# Patient Record
Sex: Male | Born: 1950
Health system: Southern US, Community
[De-identification: ages and names within clinical notes are randomized; demographics above are authoritative.]

## PROBLEM LIST (undated history)

## (undated) DIAGNOSIS — B20 Human immunodeficiency virus [HIV] disease: Secondary | ICD-10-CM

## (undated) DIAGNOSIS — E785 Hyperlipidemia, unspecified: Secondary | ICD-10-CM

## (undated) DIAGNOSIS — Z21 Asymptomatic human immunodeficiency virus [HIV] infection status: Secondary | ICD-10-CM

## (undated) DIAGNOSIS — K759 Inflammatory liver disease, unspecified: Secondary | ICD-10-CM

## (undated) DIAGNOSIS — I10 Essential (primary) hypertension: Secondary | ICD-10-CM

## (undated) DIAGNOSIS — F419 Anxiety disorder, unspecified: Secondary | ICD-10-CM

## (undated) DIAGNOSIS — R011 Cardiac murmur, unspecified: Secondary | ICD-10-CM

## (undated) DIAGNOSIS — K7460 Unspecified cirrhosis of liver: Secondary | ICD-10-CM

## (undated) DIAGNOSIS — R002 Palpitations: Secondary | ICD-10-CM

## (undated) HISTORY — DX: Anxiety disorder, unspecified: F41.9

## (undated) HISTORY — PX: HERNIA REPAIR: SHX51

## (undated) HISTORY — PX: COLONOSCOPY: SHX174

## (undated) HISTORY — DX: Asymptomatic human immunodeficiency virus (hiv) infection status: Z21

## (undated) HISTORY — DX: Unspecified cirrhosis of liver: K74.60

## (undated) HISTORY — PX: EYE SURGERY: SHX253

## (undated) HISTORY — DX: Essential (primary) hypertension: I10

## (undated) HISTORY — DX: Human immunodeficiency virus (HIV) disease: B20

## (undated) HISTORY — DX: Hyperlipidemia, unspecified: E78.5

---

## 2003-03-22 ENCOUNTER — Encounter: Payer: Self-pay | Admitting: Internal Medicine

## 2003-09-27 ENCOUNTER — Encounter: Admission: RE | Admit: 2003-09-27 | Discharge: 2003-09-27 | Payer: Self-pay | Admitting: General Surgery

## 2003-10-01 ENCOUNTER — Ambulatory Visit (HOSPITAL_BASED_OUTPATIENT_CLINIC_OR_DEPARTMENT_OTHER): Admission: RE | Admit: 2003-10-01 | Discharge: 2003-10-01 | Payer: Self-pay | Admitting: General Surgery

## 2003-10-01 ENCOUNTER — Ambulatory Visit (HOSPITAL_COMMUNITY): Admission: RE | Admit: 2003-10-01 | Discharge: 2003-10-01 | Payer: Self-pay | Admitting: General Surgery

## 2004-07-07 ENCOUNTER — Ambulatory Visit: Payer: Self-pay | Admitting: Internal Medicine

## 2004-07-23 ENCOUNTER — Ambulatory Visit: Payer: Self-pay | Admitting: Internal Medicine

## 2005-05-06 ENCOUNTER — Ambulatory Visit: Payer: Self-pay | Admitting: Family Medicine

## 2005-07-13 ENCOUNTER — Ambulatory Visit: Payer: Self-pay | Admitting: Internal Medicine

## 2005-07-30 ENCOUNTER — Ambulatory Visit: Payer: Self-pay | Admitting: Internal Medicine

## 2005-08-20 ENCOUNTER — Ambulatory Visit: Payer: Self-pay | Admitting: Internal Medicine

## 2006-07-27 ENCOUNTER — Ambulatory Visit: Payer: Self-pay | Admitting: Internal Medicine

## 2006-07-27 LAB — CONVERTED CEMR LAB
CO2: 28 meq/L (ref 19–32)
Calcium: 9.3 mg/dL (ref 8.4–10.5)
Chloride: 101 meq/L (ref 96–112)
Creatinine, Ser: 0.9 mg/dL (ref 0.4–1.5)
Glucose, Bld: 99 mg/dL (ref 70–99)
HDL: 32.9 mg/dL — ABNORMAL LOW (ref >39.0)
Sodium: 136 meq/L (ref 135–145)
Triglycerides: 161 mg/dL — ABNORMAL HIGH (ref 0–149)
VLDL: 32 mg/dL (ref 0–40)

## 2006-08-12 ENCOUNTER — Ambulatory Visit: Payer: Self-pay | Admitting: Internal Medicine

## 2006-08-26 ENCOUNTER — Ambulatory Visit: Payer: Self-pay | Admitting: Internal Medicine

## 2006-08-26 ENCOUNTER — Encounter (INDEPENDENT_AMBULATORY_CARE_PROVIDER_SITE_OTHER): Payer: Self-pay | Admitting: *Deleted

## 2007-10-28 ENCOUNTER — Ambulatory Visit: Payer: Self-pay | Admitting: Internal Medicine

## 2007-10-28 DIAGNOSIS — J309 Allergic rhinitis, unspecified: Secondary | ICD-10-CM | POA: Insufficient documentation

## 2007-10-28 DIAGNOSIS — L570 Actinic keratosis: Secondary | ICD-10-CM | POA: Insufficient documentation

## 2007-10-28 DIAGNOSIS — I10 Essential (primary) hypertension: Secondary | ICD-10-CM | POA: Insufficient documentation

## 2007-10-28 DIAGNOSIS — E785 Hyperlipidemia, unspecified: Secondary | ICD-10-CM | POA: Insufficient documentation

## 2007-11-01 LAB — CONVERTED CEMR LAB
ALT: 126 units/L — ABNORMAL HIGH (ref 0–53)
AST: 99 units/L — ABNORMAL HIGH (ref 0–37)
CO2: 28 meq/L (ref 19–32)
Cholesterol: 125 mg/dL (ref 0–200)
Creatinine, Ser: 0.9 mg/dL (ref 0.4–1.5)
Eosinophils Absolute: 0.2 10*3/uL (ref 0.0–0.7)
GFR calc Af Amer: 112 mL/min
GFR calc non Af Amer: 93 mL/min
Glucose, Bld: 103 mg/dL — ABNORMAL HIGH (ref 70–99)
HCT: 46.2 % (ref 39.0–52.0)
Hemoglobin: 16.1 g/dL (ref 13.0–17.0)
LDL Cholesterol: 84 mg/dL (ref 0–99)
MCV: 94.7 fL (ref 78.0–100.0)
Monocytes Relative: 10.9 % (ref 3.0–12.0)
Neutro Abs: 4.1 10*3/uL (ref 1.4–7.7)
Phosphorus: 3.8 mg/dL (ref 2.3–4.6)
RBC: 4.88 M/uL (ref 4.22–5.81)
RDW: 12.3 % (ref 11.5–14.6)
Sodium: 135 meq/L (ref 135–145)
TSH: 1.64 microintl units/mL (ref 0.35–5.50)
Total Bilirubin: 0.6 mg/dL (ref 0.3–1.2)
Total CHOL/HDL Ratio: 5.6
Total Protein: 7.5 g/dL (ref 6.0–8.3)
VLDL: 19 mg/dL (ref 0–40)
WBC: 9.2 10*3/uL (ref 4.5–10.5)

## 2007-11-21 ENCOUNTER — Telehealth (INDEPENDENT_AMBULATORY_CARE_PROVIDER_SITE_OTHER): Payer: Self-pay | Admitting: *Deleted

## 2007-11-21 DIAGNOSIS — R945 Abnormal results of liver function studies: Secondary | ICD-10-CM | POA: Insufficient documentation

## 2007-11-22 ENCOUNTER — Ambulatory Visit: Payer: Self-pay | Admitting: Internal Medicine

## 2007-11-28 LAB — CONVERTED CEMR LAB
ALT: 108 units/L — ABNORMAL HIGH (ref 0–53)
Alkaline Phosphatase: 114 units/L (ref 39–117)
Hep A IgM: NEGATIVE
Hep B C IgM: POSITIVE — AB
Hepatitis B Surface Ag: POSITIVE — AB
Total Bilirubin: 0.8 mg/dL (ref 0.3–1.2)
Total Protein: 7.2 g/dL (ref 6.0–8.3)

## 2007-12-14 ENCOUNTER — Ambulatory Visit: Payer: Self-pay | Admitting: Internal Medicine

## 2007-12-16 LAB — CONVERTED CEMR LAB
ALT: 120 units/L — ABNORMAL HIGH (ref 0–53)
Albumin: 3.6 g/dL (ref 3.5–5.2)
Bilirubin, Direct: 0.1 mg/dL (ref 0.0–0.3)
Hep B C IgM: POSITIVE — AB
Hep B S Ab: NEGATIVE
Hepatitis B Surface Ag: POSITIVE — AB
Total Bilirubin: 0.9 mg/dL (ref 0.3–1.2)

## 2008-02-24 ENCOUNTER — Ambulatory Visit: Payer: Self-pay | Admitting: Internal Medicine

## 2008-02-29 ENCOUNTER — Telehealth: Payer: Self-pay | Admitting: Internal Medicine

## 2008-02-29 LAB — CONVERTED CEMR LAB: Hep B S Ab: NEGATIVE

## 2008-08-15 ENCOUNTER — Ambulatory Visit: Payer: Self-pay | Admitting: Family Medicine

## 2008-10-25 ENCOUNTER — Ambulatory Visit: Payer: Self-pay | Admitting: Infectious Diseases

## 2008-10-25 LAB — CONVERTED CEMR LAB
Basophils Absolute: 0 10*3/uL (ref 0.0–0.1)
Basophils Relative: 1 % (ref 0–1)
Cytomegalovirus Ab-IgG: POSITIVE — AB
Eosinophils Absolute: 0 10*3/uL (ref 0.0–0.7)
HCT: 39.8 % (ref 39.0–52.0)
Lymphs Abs: 2.4 10*3/uL (ref 0.7–4.0)
MCHC: 35.4 g/dL (ref 30.0–36.0)
MCV: 89.2 fL (ref 78.0–100.0)
Monocytes Absolute: 0.4 10*3/uL (ref 0.1–1.0)
Neutro Abs: 1.5 10*3/uL — ABNORMAL LOW (ref 1.7–7.7)
RBC: 4.46 M/uL (ref 4.22–5.81)
Streptococcus, Group A Screen (Direct): NEGATIVE
WBC: 4.4 10*3/uL (ref 4.0–10.5)

## 2008-10-29 ENCOUNTER — Telehealth: Payer: Self-pay | Admitting: Internal Medicine

## 2008-10-30 ENCOUNTER — Ambulatory Visit: Payer: Self-pay | Admitting: Internal Medicine

## 2008-10-31 ENCOUNTER — Telehealth: Payer: Self-pay | Admitting: Internal Medicine

## 2008-10-31 LAB — CONVERTED CEMR LAB
Albumin: 3.2 g/dL — ABNORMAL LOW (ref 3.5–5.2)
BUN: 8 mg/dL (ref 6–23)
Creatinine, Ser: 0.9 mg/dL (ref 0.4–1.5)
HDL: 20.6 mg/dL — ABNORMAL LOW (ref >39.00)
LDL Cholesterol: 77 mg/dL (ref 0–99)
Phosphorus: 2.6 mg/dL (ref 2.3–4.6)
Total Bilirubin: 0.8 mg/dL (ref 0.3–1.2)
Total CHOL/HDL Ratio: 6
Total Protein: 6.8 g/dL (ref 6.0–8.3)
VLDL: 34 mg/dL (ref 0.0–40.0)

## 2008-11-05 ENCOUNTER — Ambulatory Visit: Payer: Self-pay | Admitting: Internal Medicine

## 2008-11-06 ENCOUNTER — Encounter: Payer: Self-pay | Admitting: Internal Medicine

## 2008-11-08 ENCOUNTER — Telehealth: Payer: Self-pay | Admitting: Infectious Diseases

## 2008-11-13 ENCOUNTER — Telehealth: Payer: Self-pay | Admitting: Infectious Diseases

## 2008-11-13 ENCOUNTER — Ambulatory Visit: Payer: Self-pay | Admitting: Infectious Disease

## 2008-11-13 ENCOUNTER — Ambulatory Visit: Payer: Self-pay | Admitting: Cardiology

## 2008-11-13 ENCOUNTER — Ambulatory Visit: Payer: Self-pay | Admitting: Internal Medicine

## 2008-11-13 ENCOUNTER — Inpatient Hospital Stay (HOSPITAL_COMMUNITY): Admission: EM | Admit: 2008-11-13 | Discharge: 2008-11-15 | Payer: Self-pay | Admitting: Emergency Medicine

## 2008-11-14 ENCOUNTER — Encounter (INDEPENDENT_AMBULATORY_CARE_PROVIDER_SITE_OTHER): Payer: Self-pay | Admitting: *Deleted

## 2008-11-14 ENCOUNTER — Encounter: Payer: Self-pay | Admitting: Internal Medicine

## 2008-11-21 ENCOUNTER — Ambulatory Visit: Payer: Self-pay | Admitting: Infectious Disease

## 2008-11-21 DIAGNOSIS — B20 Human immunodeficiency virus [HIV] disease: Secondary | ICD-10-CM | POA: Insufficient documentation

## 2008-11-21 LAB — CONVERTED CEMR LAB
ALT: 35 units/L (ref 0–53)
AST: 58 units/L — ABNORMAL HIGH (ref 0–37)
Albumin: 3.6 g/dL (ref 3.5–5.2)
Basophils Absolute: 0.2 10*3/uL — ABNORMAL HIGH (ref 0.0–0.1)
Bilirubin, Direct: 0.1 mg/dL (ref 0.0–0.3)
CD4 T Cell Abs: 560
CO2: 22 meq/L (ref 19–32)
Creatinine, Ser: 0.94 mg/dL (ref 0.40–1.50)
MCHC: 34.3 g/dL (ref 30.0–36.0)
MCV: 91.4 fL (ref 78.0–100.0)
Monocytes Relative: 17 % — ABNORMAL HIGH (ref 3–12)
Neutrophils Relative %: 30 % — ABNORMAL LOW (ref 43–77)
RDW: 14.5 % (ref 11.5–15.5)
Sodium: 135 meq/L (ref 135–145)
Total Bilirubin: 0.5 mg/dL (ref 0.3–1.2)
Total Protein: 7.5 g/dL (ref 6.0–8.3)
WBC: 8 10*3/uL (ref 4.0–10.5)

## 2008-12-03 ENCOUNTER — Ambulatory Visit: Payer: Self-pay | Admitting: Infectious Disease

## 2008-12-03 DIAGNOSIS — D696 Thrombocytopenia, unspecified: Secondary | ICD-10-CM | POA: Insufficient documentation

## 2008-12-03 DIAGNOSIS — R599 Enlarged lymph nodes, unspecified: Secondary | ICD-10-CM | POA: Insufficient documentation

## 2008-12-03 DIAGNOSIS — B181 Chronic viral hepatitis B without delta-agent: Secondary | ICD-10-CM | POA: Insufficient documentation

## 2008-12-03 DIAGNOSIS — J3489 Other specified disorders of nose and nasal sinuses: Secondary | ICD-10-CM | POA: Insufficient documentation

## 2008-12-03 LAB — CONVERTED CEMR LAB
Chlamydia, Swab/Urine, PCR: NEGATIVE
Cholesterol, target level: 200 mg/dL
HDL goal, serum: 40 mg/dL

## 2008-12-04 ENCOUNTER — Encounter: Payer: Self-pay | Admitting: Infectious Disease

## 2008-12-07 ENCOUNTER — Ambulatory Visit: Payer: Self-pay | Admitting: Infectious Disease

## 2008-12-14 ENCOUNTER — Ambulatory Visit: Payer: Self-pay | Admitting: Infectious Disease

## 2008-12-20 ENCOUNTER — Telehealth: Payer: Self-pay | Admitting: Infectious Disease

## 2008-12-28 ENCOUNTER — Telehealth: Payer: Self-pay | Admitting: Infectious Disease

## 2009-01-11 ENCOUNTER — Ambulatory Visit: Payer: Self-pay | Admitting: Infectious Disease

## 2009-01-21 ENCOUNTER — Telehealth: Payer: Self-pay | Admitting: Internal Medicine

## 2009-02-04 ENCOUNTER — Ambulatory Visit: Payer: Self-pay | Admitting: Infectious Disease

## 2009-02-04 DIAGNOSIS — G4700 Insomnia, unspecified: Secondary | ICD-10-CM | POA: Insufficient documentation

## 2009-02-06 ENCOUNTER — Encounter: Payer: Self-pay | Admitting: Infectious Disease

## 2009-02-08 ENCOUNTER — Ambulatory Visit: Payer: Self-pay | Admitting: Infectious Disease

## 2009-02-08 LAB — CONVERTED CEMR LAB
ALT: 33 units/L (ref 0–53)
AST: 36 units/L (ref 0–37)
Albumin: 4.3 g/dL (ref 3.5–5.2)
Alkaline Phosphatase: 139 units/L — ABNORMAL HIGH (ref 39–117)
Basophils Relative: 1 % (ref 0–1)
CO2: 26 meq/L (ref 19–32)
Chloride: 100 meq/L (ref 96–112)
Glucose, Bld: 92 mg/dL (ref 70–99)
HCT: 46 % (ref 39.0–52.0)
HIV 1 RNA Quant: 230 copies/mL
Hemoglobin: 16 g/dL (ref 13.0–17.0)
Lymphocytes Relative: 40 % (ref 12–46)
MCHC: 34.8 g/dL (ref 30.0–36.0)
Monocytes Relative: 9 % (ref 3–12)
Neutro Abs: 5.3 10*3/uL (ref 1.7–7.7)
Phosphorus: 3.2 mg/dL (ref 2.3–4.6)
Platelets: 201 10*3/uL (ref 150–400)
RDW: 14.6 % (ref 11.5–15.5)
WBC: 10.9 10*3/uL — ABNORMAL HIGH (ref 4.0–10.5)

## 2009-03-13 ENCOUNTER — Telehealth: Payer: Self-pay | Admitting: Infectious Disease

## 2009-03-14 ENCOUNTER — Telehealth (INDEPENDENT_AMBULATORY_CARE_PROVIDER_SITE_OTHER): Payer: Self-pay | Admitting: *Deleted

## 2009-04-03 ENCOUNTER — Ambulatory Visit: Payer: Self-pay | Admitting: Infectious Disease

## 2009-04-03 LAB — CONVERTED CEMR LAB
CD4 Count: 805 microliters
HIV 1 RNA Quant: 39 copies/mL

## 2009-04-04 ENCOUNTER — Encounter: Payer: Self-pay | Admitting: Infectious Disease

## 2009-04-04 LAB — CONVERTED CEMR LAB
Calcium: 9.6 mg/dL (ref 8.4–10.5)
Cholesterol: 232 mg/dL — ABNORMAL HIGH (ref 0–200)
Creatinine, Urine: 104.3 mg/dL
LDL Cholesterol: 168 mg/dL — ABNORMAL HIGH (ref 0–99)
Potassium: 4.1 meq/L (ref 3.5–5.3)
Total Bilirubin: 2.6 mg/dL — ABNORMAL HIGH (ref 0.3–1.2)
Total CHOL/HDL Ratio: 7.7
Total Protein, Urine: 4
Triglycerides: 168 mg/dL — ABNORMAL HIGH (ref <150)
VLDL: 34 mg/dL (ref 0–40)

## 2009-05-16 ENCOUNTER — Ambulatory Visit: Payer: Self-pay | Admitting: Family Medicine

## 2009-05-28 ENCOUNTER — Ambulatory Visit: Payer: Self-pay | Admitting: Infectious Disease

## 2009-05-28 LAB — CONVERTED CEMR LAB
Albumin: 4.3 g/dL (ref 3.5–5.2)
Alkaline Phosphatase: 130 units/L — ABNORMAL HIGH (ref 39–117)
BUN: 9 mg/dL (ref 6–23)
Cholesterol: 232 mg/dL — ABNORMAL HIGH (ref 0–200)
HDL: 32 mg/dL — ABNORMAL LOW (ref >39)
Phosphorus: 3.1 mg/dL (ref 2.3–4.6)
Potassium: 4.2 meq/L (ref 3.5–5.3)
Sodium: 136 meq/L (ref 135–145)
Total CHOL/HDL Ratio: 7.3
Triglycerides: 156 mg/dL — ABNORMAL HIGH (ref <150)
VLDL: 31 mg/dL (ref 0–40)

## 2009-05-31 ENCOUNTER — Ambulatory Visit: Payer: Self-pay | Admitting: Infectious Disease

## 2009-06-10 ENCOUNTER — Ambulatory Visit: Payer: Self-pay | Admitting: Infectious Disease

## 2009-07-05 ENCOUNTER — Ambulatory Visit: Payer: Self-pay | Admitting: Internal Medicine

## 2009-08-27 ENCOUNTER — Ambulatory Visit: Payer: Self-pay | Admitting: Infectious Disease

## 2009-08-27 ENCOUNTER — Encounter: Payer: Self-pay | Admitting: Infectious Disease

## 2009-08-27 ENCOUNTER — Encounter: Payer: Self-pay | Admitting: Internal Medicine

## 2009-08-27 LAB — CONVERTED CEMR LAB
ALT: 57 units/L — ABNORMAL HIGH (ref 0–53)
AST: 39 units/L — ABNORMAL HIGH (ref 0–37)
Alkaline Phosphatase: 126 units/L — ABNORMAL HIGH (ref 39–117)
BUN: 10 mg/dL (ref 6–23)
Bilirubin, Direct: 0.4 mg/dL — ABNORMAL HIGH (ref 0.0–0.3)
Creatinine, Ser: 0.98 mg/dL (ref 0.40–1.50)
LDL Cholesterol: 70 mg/dL (ref 0–99)
Phosphorus: 2.7 mg/dL (ref 2.3–4.6)
Potassium: 3.8 meq/L (ref 3.5–5.3)
Sodium: 135 meq/L (ref 135–145)

## 2009-10-29 ENCOUNTER — Telehealth (INDEPENDENT_AMBULATORY_CARE_PROVIDER_SITE_OTHER): Payer: Self-pay | Admitting: *Deleted

## 2009-11-06 ENCOUNTER — Ambulatory Visit: Payer: Self-pay | Admitting: Infectious Disease

## 2009-11-06 ENCOUNTER — Encounter (INDEPENDENT_AMBULATORY_CARE_PROVIDER_SITE_OTHER): Payer: Self-pay | Admitting: *Deleted

## 2009-11-06 LAB — CONVERTED CEMR LAB
ALT: 136 units/L — ABNORMAL HIGH (ref 0–53)
Albumin: 4.6 g/dL (ref 3.5–5.2)
BUN: 11 mg/dL (ref 6–23)
CO2: 27 meq/L (ref 19–32)
Chloride: 102 meq/L (ref 96–112)
Creatinine, Ser: 0.98 mg/dL (ref 0.40–1.50)
Hemoglobin, Urine: NEGATIVE
Nitrite: NEGATIVE
Phosphorus: 3.6 mg/dL (ref 2.3–4.6)
Potassium: 4.7 meq/L (ref 3.5–5.3)
Sodium: 141 meq/L (ref 135–145)
Total Protein, Urine: 4
Total Protein: 8 g/dL (ref 6.0–8.3)
Urobilinogen, UA: 1 (ref 0.0–1.0)

## 2009-11-07 ENCOUNTER — Ambulatory Visit: Payer: Self-pay | Admitting: Infectious Disease

## 2009-11-07 LAB — CONVERTED CEMR LAB
Cholesterol, target level: 200 mg/dL
LDL Goal: 130 mg/dL

## 2010-01-25 ENCOUNTER — Telehealth: Payer: Self-pay | Admitting: Internal Medicine

## 2010-02-03 ENCOUNTER — Encounter: Payer: Self-pay | Admitting: Internal Medicine

## 2010-02-13 ENCOUNTER — Encounter (INDEPENDENT_AMBULATORY_CARE_PROVIDER_SITE_OTHER): Payer: Self-pay | Admitting: *Deleted

## 2010-03-05 ENCOUNTER — Ambulatory Visit: Payer: Self-pay | Admitting: Infectious Disease

## 2010-03-05 LAB — CONVERTED CEMR LAB
ALT: 23 units/L (ref 0–53)
Alkaline Phosphatase: 127 units/L — ABNORMAL HIGH (ref 39–117)
CO2: 25 meq/L (ref 19–32)
Calcium: 9 mg/dL (ref 8.4–10.5)
Cholesterol: 143 mg/dL (ref 0–200)
Creatinine, Ser: 0.86 mg/dL (ref 0.40–1.50)
Glucose, Bld: 105 mg/dL — ABNORMAL HIGH (ref 70–99)
Indirect Bilirubin: 2.9 mg/dL — ABNORMAL HIGH (ref 0.0–0.9)
Total Protein: 6.9 g/dL (ref 6.0–8.3)
VLDL: 41 mg/dL — ABNORMAL HIGH (ref 0–40)

## 2010-03-07 ENCOUNTER — Encounter: Payer: Self-pay | Admitting: *Deleted

## 2010-04-24 ENCOUNTER — Ambulatory Visit: Payer: Self-pay | Admitting: Internal Medicine

## 2010-04-28 LAB — CONVERTED CEMR LAB: PSA: 0.34 ng/mL (ref 0.10–4.00)

## 2010-06-04 ENCOUNTER — Encounter: Payer: Self-pay | Admitting: Infectious Disease

## 2010-06-04 ENCOUNTER — Ambulatory Visit
Admission: RE | Admit: 2010-06-04 | Discharge: 2010-06-04 | Payer: Self-pay | Source: Home / Self Care | Attending: Infectious Disease | Admitting: Infectious Disease

## 2010-06-04 DIAGNOSIS — F17200 Nicotine dependence, unspecified, uncomplicated: Secondary | ICD-10-CM | POA: Insufficient documentation

## 2010-06-22 LAB — CONVERTED CEMR LAB
ALT: 28 units/L (ref 0–53)
AST: 38 units/L — ABNORMAL HIGH (ref 0–37)
AST: 39 units/L — ABNORMAL HIGH (ref 0–37)
Albumin: 3.5 g/dL (ref 3.5–5.2)
Alkaline Phosphatase: 134 units/L — ABNORMAL HIGH (ref 39–117)
BUN: 14 mg/dL (ref 6–23)
BUN: 7 mg/dL (ref 6–23)
BUN: 8 mg/dL (ref 6–23)
Bilirubin Urine: NEGATIVE
Bilirubin, Direct: 0.2 mg/dL (ref 0.0–0.3)
Bilirubin, Direct: 0.2 mg/dL (ref 0.0–0.3)
CD4 Count: 456 microliters
CD4 Count: 783 microliters
CO2: 23 meq/L (ref 19–32)
CO2: 25 meq/L (ref 19–32)
Calcium: 8.8 mg/dL (ref 8.4–10.5)
Calcium: 9.1 mg/dL (ref 8.4–10.5)
Chloride: 102 meq/L (ref 96–112)
Chloride: 102 meq/L (ref 96–112)
Cholesterol: 176 mg/dL (ref 0–200)
Creatinine, Ser: 0.76 mg/dL (ref 0.40–1.50)
Creatinine, Ser: 0.92 mg/dL (ref 0.40–1.50)
Glucose, Bld: 69 mg/dL — ABNORMAL LOW (ref 70–99)
Glucose, Bld: 85 mg/dL (ref 70–99)
HDL: 23 mg/dL — ABNORMAL LOW (ref >39)
Hep B Core Total Ab: POSITIVE — AB
Indirect Bilirubin: 0.3 mg/dL (ref 0.0–0.9)
LDL Cholesterol: 117 mg/dL — ABNORMAL HIGH (ref 0–99)
Nitrite: NEGATIVE
Phosphorus: 2.4 mg/dL (ref 2.3–4.6)
Potassium: 4 meq/L (ref 3.5–5.3)
Potassium: 4.2 meq/L (ref 3.5–5.3)
Protein, U semiquant: NEGATIVE
Sodium: 134 meq/L — ABNORMAL LOW (ref 135–145)
Specific Gravity, Urine: 1.01
Total Bilirubin: 0.5 mg/dL (ref 0.3–1.2)
Total CHOL/HDL Ratio: 7.7
Total Protein: 7.4 g/dL (ref 6.0–8.3)
Triglycerides: 181 mg/dL — ABNORMAL HIGH (ref <150)
VLDL: 36 mg/dL (ref 0–40)
WBC Urine, dipstick: NEGATIVE
pH: 7

## 2010-06-24 NOTE — Assessment & Plan Note (Signed)
Summary: STUDY APPT/ LH    Current Allergies: ! * ZYBAN Social History: Tobacco Use:  yes  Vital Signs:  Patient profile:   60 year old male Weight:      197.4 pounds (89.73 kg) BMI:     27.25 Temp:     98.5 degrees F oral Pulse rate:   68 / minute Resp:     18 per minute BP sitting:   144 / 77  (left arm) Is Patient Diabetic? No Pain Assessment Patient in pain? no      Nutritional Status BMI of 25 - 29 = overweight  Does patient need assistance? Functional Status Self care Ambulation Normal   Patient here for week 36 study visit. He denies any current problems. Deirdre Evener RN  August 27, 2009 9:16 AM    Complete Medication List: 1)  Lisinopril-hydrochlorothiazide 20-25 Mg Tabs (Lisinopril-hydrochlorothiazide) .Marland Kitchen.. 1 tablet daily by mouth 2)  Ondansetron 4 Mg Tbdp (Ondansetron) .... Four times a day as needed for nausea 3)  Norvir 100 Mg Tabs (Ritonavir) .Marland Kitchen.. 1 by mouth daily with food 4)  Truvada 200-300 Mg Tabs (Emtricitabine-tenofovir) .Marland Kitchen.. 1 by mouth daily 5)  Reyataz 300 Mg Caps (Atazanavir sulfate) .Marland Kitchen.. 1 by mouth daily 6)  Citalopram Hydrobromide 20 Mg Tabs (Citalopram hydrobromide) .Marland Kitchen.. 1 tab daily 7)  Lunesta 1 Mg Tabs (Eszopiclone) .... Take 1 tab by mouth at bedtime as needed insominia 8)  Lipitor 20 Mg Tabs (Atorvastatin calcium) .... Take 1 tablet by mouth once a day 9)  Acyclovir 200 Mg Caps (Acyclovir) .... 4 tabs by mouth three times a day for 3-5 days as needed for cold sores  Other Orders: Est. Patient Research Study [id number]) T-Basic Metabolic Panel [phone]) T-Lipid Profile [id number]) T-Hepatic Function [id number]) T-Phosphorus [phone])  Process Orders Check Orders Results:     [facility]: ABN not required for this insurance Tests Sent for requisitioning (August 27, 2009 9:14 AM):     08/27/2009: [facility] -- T-Basic Metabolic Panel [phone] (signed)     08/27/2009: [facility] -- T-Lipid Profile [id number] (signed)     08/27/2009: [facility] -- T-Hepatic Function [phone] (signed)     08/27/2009: [facility] -- T-Phosphorus [[id number]] (signed)

## 2010-06-24 NOTE — Progress Notes (Signed)
Summary: refill request for citalopram  Phone Note Refill Request Message from:  [employer]  Refills Requested: Medication #1:  citalopram Electronic request from [facility], this is no longer on med list.  No last date filled given.  Initial call taken by: Lowella Petties CMA,  January 25, 2010 9:10 AM  Follow-up for Phone Call        Please check with him Taken off list at Inf disease visit okay to refill if he is still taking Follow-up by: Cindee Salt MD,  January 28, 2010 8:08 AM  Additional Follow-up for Phone Call Additional follow up Details #1::        spoke with wife she will call pt at work and call me back, it looks like pt never stopped this in June when Dr.Van Dam took him off. I will wait for wife's call. DeShannon Smith CMA [facility])  January 28, 2010 8:55 AM   patient called back and he is not taking this anymore and that he will ask Dr. Daiva Eves if he should. Additional Follow-up by: Mervin Hack CMA [facility]),  January 28, 2010 9:19 AM
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
## 2010-06-24 NOTE — Miscellaneous (Signed)
Summary: HIV-1 RNA (RESEARCH)  Clinical Lists Changes  Observations: Added new observation of HIV1RNA QA: 39 copies/mL (05/28/2009 16:29)

## 2010-06-24 NOTE — Consult Note (Signed)
Summary: [facility]  [facility]   Imported By: Lester Dustin [place] 02/10/2010 10:45:13  _____________________________________________________________________  External Attachment:    Type:   Image     Comment:   External Document  Appended Document: [facility] cautery done for recurrent epistaxis

## 2010-06-24 NOTE — Assessment & Plan Note (Signed)
Summary: STUDY APPT / LH    Current Allergies: ! * ZYBAN  Complete Medication List: 1)  Lisinopril-hydrochlorothiazide 20-25 Mg Tabs (Lisinopril-hydrochlorothiazide) .Marland Kitchen.. 1 tablet daily by mouth 2)  Acyclovir 800 Mg Tabs (Acyclovir) .Marland Kitchen.. 1 three times a day for up to 3-5 days as needed for cold sores 3)  Ondansetron 4 Mg Tbdp (Ondansetron) .... Four times a day as needed for nausea 4)  Norvir 100 Mg Tabs (Ritonavir) .Marland Kitchen.. 1 by mouth daily with food 5)  Truvada 200-300 Mg Tabs (Emtricitabine-tenofovir) .Marland Kitchen.. 1 by mouth daily 6)  Reyataz 300 Mg Caps (Atazanavir sulfate) .Marland Kitchen.. 1 by mouth daily 7)  Citalopram Hydrobromide 20 Mg Tabs (Citalopram hydrobromide) .Marland Kitchen.. 1 tab daily 8)  Lunesta 1 Mg Tabs (Eszopiclone) .... Take 1 tab by mouth at bedtime as needed insominia

## 2010-06-24 NOTE — Assessment & Plan Note (Signed)
Summary: STUDY APPT/ LH    Current Allergies: ! * ZYBAN Social History: Tobacco Use:  yes  Vital Signs:  Patient profile:   60 year old male Weight:      193 pounds (87.73 kg) BMI:     26.64 Temp:     98.7 degrees F oral Pulse rate:   64 / minute Resp:     16 per minute BP sitting:   144 / 88  (left arm) Is Patient Diabetic? No Pain Assessment Patient in pain? no      Nutritional Status BMI of 19 -24 = normal  Does patient need assistance? Functional Status Self care Ambulation Normal   Patient here for week 24 study visit. York Spaniel he recently got over a sinus infection and was treated with augmentin. Denies any other current problems except dry mouth. Has been very adherent with medications. Appt. to see Dr. Daiva Eves in a few weeks.Deirdre Evener RN  May 28, 2009 9:17 AM    Complete Medication List: 1)  Lisinopril-hydrochlorothiazide 20-25 Mg Tabs (Lisinopril-hydrochlorothiazide) .Marland Kitchen.. 1 tablet daily by mouth 2)  Acyclovir 800 Mg Tabs (Acyclovir) .Marland Kitchen.. 1 three times a day for up to 3-5 days as needed for cold sores 3)  Ondansetron 4 Mg Tbdp (Ondansetron) .... Four times a day as needed for nausea 4)  Norvir 100 Mg Tabs (Ritonavir) .Marland Kitchen.. 1 by mouth daily with food 5)  Truvada 200-300 Mg Tabs (Emtricitabine-tenofovir) .Marland Kitchen.. 1 by mouth daily 6)  Reyataz 300 Mg Caps (Atazanavir sulfate) .Marland Kitchen.. 1 by mouth daily 7)  Citalopram Hydrobromide 20 Mg Tabs (Citalopram hydrobromide) .Marland Kitchen.. 1 tab daily 8)  Lunesta 1 Mg Tabs (Eszopiclone) .... Take 1 tab by mouth at bedtime as needed insominia  Other Orders: Est. Patient Research Study [id number]) T-Basic Metabolic Panel [id number]) T-Hepatic Function [id number]) T-Lipid Profile [id number]) T-Phosphorus [phone]) Process Orders Check Orders Results:     [facility]: ABN not required for this insurance Tests Sent for requisitioning (May 28, 2009 11:00 PM):     05/28/2009: [facility] -- T-Basic  Metabolic Panel [phone] (signed)     05/28/2009: [facility] -- T-Hepatic Function [phone] (signed)     05/28/2009: [facility] -- T-Lipid Profile [phone] (signed)     05/28/2009: [facility] -- T-Phosphorus [[id number]] (signed)

## 2010-06-24 NOTE — Miscellaneous (Signed)
Summary: HIV-1 RNA, CD4 (RESEARCH)  Clinical Lists Changes  Observations: Added new observation of CD4 COUNT: 724 microliters (03/05/2010 14:47) Added new observation of HIV1RNA QA: 39 copies/mL (03/05/2010 14:47)

## 2010-06-24 NOTE — Miscellaneous (Signed)
Summary: CD4 (RESEARCH)  Clinical Lists Changes  Observations: Added new observation of CD4 COUNT: 1136 microliters (05/31/2009 16:31)

## 2010-06-24 NOTE — Progress Notes (Signed)
Summary: refill/mld  Phone Note Refill Request Message from:  Fax from Pharmacy on October 29, 2009 9:04 AM  Refills Requested: Medication #1:  NORVIR 100 MG TABS 1 by mouth daily with food   Last Refilled: 09/28/2009  Method Requested: Fax to Local Pharmacy Next Appointment Scheduled: November 07, 2009 Initial call taken by: Paulo Fruit  BS,CPht II,MPH,  October 29, 2009 9:05 AM    Prescriptions: NORVIR 100 MG TABS (RITONAVIR) 1 by mouth daily with food  #31 x 11   Entered by:   Paulo Fruit  BS,CPht II,MPH   Authorized by:   Acey Lav MD   Signed by:   Paulo Fruit  BS,CPht II,MPH on 10/29/2009   Method used:   Electronically to        [id number] (retail)       [address]       [place], [place]  [address]       Ph: [phone] or [phone]       Fax: [phone]   RxID:   [id number]  Paulo Fruit  BS,CPht II,MPH  October 29, 2009 9:05 AM

## 2010-06-24 NOTE — Miscellaneous (Signed)
Summary: HIV-1 RNA, CD4 (RESEARCH)  Clinical Lists Changes  Observations: Added new observation of CD4 COUNT: 741 microliters (08/27/2009 16:35) Added new observation of HIV1RNA QA: 41 copies/mL (08/27/2009 16:35)

## 2010-06-24 NOTE — Miscellaneous (Signed)
Summary: HIV-1 RNA, CD4 (RESEARCH)  Clinical Lists Changes  Observations: Added new observation of CD4 COUNT: 941 microliters (11/06/2009 9:48) Added new observation of HIV1RNA QA: 39 copies/mL (11/06/2009 9:48)
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
## 2010-06-24 NOTE — Assessment & Plan Note (Signed)
Summary: STUDY APPT/ LH    Current Allergies: ! * ZYBAN Vital Signs:  Patient profile:   60 year old male Weight:      198.4 pounds (90.18 kg) BMI:     27.38 Temp:     98.4 degrees F oral Pulse rate:   66 / minute Resp:     16 per minute BP sitting:   125 / 85  (left arm) Is Patient Diabetic? No Pain Assessment Patient in pain? no      Nutritional Status BMI of 25 - 29 = overweight  Does patient need assistance? Functional Status Self care Ambulation Normal   Patient here for week 48 study visit. He denies any new problems, but does c/o dry throat which has been fairly persistent. He stopped his celexa on his own last month, because he felt like he didn't need it anymore. He has been very adherent.Deirdre Evener RN  November 06, 2009 11:43 AM   Other Orders: Est. Patient Research Study [id number]) T-Basic Metabolic Panel [id number]) T-Hepatic Function [id number]) T-Urine Protein [id number]) T-Phosphorus [id number]) T-Lipid Profile [id number]) T-Urinalysis [id number]) T-Urine Creatinine ([id number])  Process Orders Check Orders Results:     [facility]: ABN not required for this insurance Tests Sent for requisitioning (November 06, 2009 11:38 AM):     11/06/2009: [facility] -- T-Basic Metabolic Panel [id number] (signed)     11/06/2009: [facility] -- T-Hepatic Function [phone] (signed)     11/06/2009: [facility] -- T-Urine Protein [id number] (signed)     11/06/2009: [facility] -- T-Phosphorus [[id number]] (signed)     11/06/2009: [facility] -- T-Lipid Profile [id number] (signed)     11/06/2009: [facility] -- T-Urinalysis [[id number]] (signed)     11/06/2009: [facility] -- T-Urine Creatinine [[id number]] (signed)
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
## 2010-06-24 NOTE — Miscellaneous (Signed)
Summary: RW update  Clinical Lists Changes  Observations: Added new observation of RWPARTICIP: Yes (02/13/2010 9:08)

## 2010-06-24 NOTE — Miscellaneous (Signed)
Summary: norvir good thru 10/30/10   Clinical Lists Changes found old refill request for norvir. dated 10/29/09. will not need refills until 6/12.Golden Circle RN  March 07, 2010 8:15 AM

## 2010-06-24 NOTE — Assessment & Plan Note (Signed)
   Process Orders Check Orders Results:     [facility]: ABN not required for this insurance Order queued for requisitioning for [facility]: August 27, 2009 9:05 AM  Tests Sent for requisitioning (August 27, 2009 9:05 AM):     08/27/2009: [facility] -- T-RPR (Syphilis) [phone] (signed)

## 2010-06-25 ENCOUNTER — Ambulatory Visit: Admit: 2010-06-25 | Payer: Self-pay | Admitting: Infectious Disease

## 2010-06-25 ENCOUNTER — Ambulatory Visit (INDEPENDENT_AMBULATORY_CARE_PROVIDER_SITE_OTHER): Payer: Self-pay

## 2010-06-25 ENCOUNTER — Encounter: Payer: Self-pay | Admitting: Infectious Disease

## 2010-06-25 DIAGNOSIS — B20 Human immunodeficiency virus [HIV] disease: Secondary | ICD-10-CM

## 2010-06-25 LAB — CONVERTED CEMR LAB
BUN: 12 mg/dL (ref 6–23)
Bilirubin, Direct: 0.5 mg/dL — ABNORMAL HIGH (ref 0.0–0.3)
CD4 Count: 893 microliters
Chloride: 96 meq/L (ref 96–112)
Cholesterol: 134 mg/dL (ref 0–200)
Glucose, Bld: 155 mg/dL — ABNORMAL HIGH (ref 70–99)
HIV 1 RNA Quant: 39 copies/mL
Indirect Bilirubin: 2.4 mg/dL — ABNORMAL HIGH (ref 0.0–0.9)
LDL Cholesterol: 70 mg/dL (ref 0–99)
Potassium: 4.2 meq/L (ref 3.5–5.3)
VLDL: 31 mg/dL (ref 0–40)
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
## 2010-07-02 ENCOUNTER — Encounter (INDEPENDENT_AMBULATORY_CARE_PROVIDER_SITE_OTHER): Payer: Self-pay | Admitting: *Deleted

## 2010-07-02 NOTE — Miscellaneous (Signed)
Summary: Orders Update  Clinical Lists Changes  Orders: Added new Service order of Est. Patient Research Study [phone]) - Signed Added new Test order of T-Basic Metabolic Panel [id number]) - Signed Added new Test order of T-Hepatic Function [phone]) - Signed Added new Test order of T-Lipid Profile ([id number]) - Signed Added new Test order of T-Phosphorus ([id number]) - Signed

## 2010-07-02 NOTE — Assessment & Plan Note (Signed)
Summary: STUDY APPT/ LH    Current Allergies: ! * ZYBAN  Other Orders: Est. Patient Research Study [phone]) T-Basic Metabolic Panel [id number]) T-Hepatic Function [id number]) T-Lipid Profile [id number]) T-Phosphorus [id number])

## 2010-07-03 ENCOUNTER — Encounter (INDEPENDENT_AMBULATORY_CARE_PROVIDER_SITE_OTHER): Payer: Self-pay | Admitting: *Deleted

## 2010-07-10 NOTE — Miscellaneous (Signed)
  Clinical Lists Changes 

## 2010-07-10 NOTE — Miscellaneous (Signed)
  Clinical Lists Changes  Observations: Added new observation of PAYOR: Private (07/03/2010 13:52) Added new observation of RACE: White (07/03/2010 13:52) Added new observation of LATINO/HISP: No (07/03/2010 13:52)

## 2010-07-16 NOTE — Miscellaneous (Signed)
Summary: HIV-1 RNA, CD4 (RESEARCH)  Clinical Lists Changes  Observations: Added new observation of CD4 COUNT: 893 microliters (06/25/2010 10:49) Added new observation of HIV1RNA QA: 39 copies/mL (06/25/2010 10:49)

## 2010-09-01 ENCOUNTER — Encounter: Payer: Self-pay | Admitting: Infectious Disease

## 2010-09-01 LAB — C-REACTIVE PROTEIN: CRP: 0.7 mg/dL — ABNORMAL HIGH (ref <0.6)

## 2010-09-01 LAB — CBC
HCT: 41.6 % (ref 39.0–52.0)
Platelets: 104 10*3/uL — ABNORMAL LOW (ref 150–400)
RDW: 14 % (ref 11.5–15.5)
WBC: 6.6 10*3/uL (ref 4.0–10.5)

## 2010-09-01 LAB — COMPREHENSIVE METABOLIC PANEL
AST: 69 U/L — ABNORMAL HIGH (ref 0–37)
Albumin: 3.1 g/dL — ABNORMAL LOW (ref 3.5–5.2)
Alkaline Phosphatase: 143 U/L — ABNORMAL HIGH (ref 39–117)
BUN: 6 mg/dL (ref 6–23)
Creatinine, Ser: 0.79 mg/dL (ref 0.4–1.5)
GFR calc Af Amer: >60 mL/min (ref >60)
Potassium: 4.1 mEq/L (ref 3.5–5.1)
Total Protein: 7.4 g/dL (ref 6.0–8.3)

## 2010-09-01 LAB — HEPATITIS B DNA, ULTRAQUANTITATIVE, PCR: Hepatitis B DNA (Calc): >64020000 copies/mL — ABNORMAL HIGH (ref <169)

## 2010-09-01 LAB — PROTEIN ELECTROPH W RFLX QUANT IMMUNOGLOBULINS
Alpha-1-Globulin: 5.1 % — ABNORMAL HIGH (ref 2.9–4.9)
Beta 2: 3.7 % (ref 3.2–6.5)
Gamma Globulin: 31.4 % — ABNORMAL HIGH (ref 11.1–18.8)
M-Spike, %: NOT DETECTED g/dL

## 2010-09-01 LAB — DIFFERENTIAL
Eosinophils Relative: 0 % (ref 0–5)
Lymphocytes Relative: 66 % — ABNORMAL HIGH (ref 12–46)
Monocytes Absolute: 0.7 10*3/uL (ref 0.1–1.0)
Monocytes Relative: 11 % (ref 3–12)
Neutro Abs: 1.4 10*3/uL — ABNORMAL LOW (ref 1.7–7.7)

## 2010-09-01 LAB — BARTONELLA ANTIBODY PANEL
B henselae IgG: <1:64 {titer}
B henselae IgM: <1:16 {titer}

## 2010-09-01 LAB — HEPATITIS B SURFACE ANTIGEN: Hepatitis B Surface Ag: POSITIVE — AB

## 2010-09-01 LAB — ANTI-NUCLEAR AB-TITER (ANA TITER)

## 2010-09-01 LAB — HIV-1 RNA QUANT-NO REFLEX-BLD
HIV 1 RNA Quant: 1890000 copies/mL — ABNORMAL HIGH (ref <48)
HIV-1 RNA Quant, Log: 6.28 {Log} — ABNORMAL HIGH (ref <1.68)

## 2010-09-01 LAB — HIV 1/2 CONFIRMATION
HIV-1 antibody: POSITIVE
HIV-2 Ab: UNDETERMINED

## 2010-09-01 LAB — CK: Total CK: 77 U/L (ref 7–232)

## 2010-09-01 LAB — HIV-1 GENOTYPR PLUS

## 2010-09-01 LAB — ANA: Anti Nuclear Antibody(ANA): POSITIVE — AB

## 2010-09-01 LAB — CRYPTOCOCCAL ANTIGEN: Crypto Ag: NEGATIVE

## 2010-09-01 LAB — T-HELPER CELLS (CD4) COUNT (NOT AT ARMC): CD4 T Cell Abs: 560 uL (ref 400–2700)

## 2010-09-01 LAB — HEPATITIS C ANTIBODY: HCV Ab: NEGATIVE

## 2010-09-01 LAB — LACTATE DEHYDROGENASE: LDH: 345 U/L — ABNORMAL HIGH (ref 94–250)

## 2010-09-29 ENCOUNTER — Other Ambulatory Visit: Payer: Self-pay | Admitting: *Deleted

## 2010-09-29 MED ORDER — LISINOPRIL-HYDROCHLOROTHIAZIDE 20-25 MG PO TABS: 1.0000 | ORAL_TABLET | Freq: Every day | ORAL | Status: DC

## 2010-10-07 NOTE — Consult Note (Signed)
NAME:  Dale Mcdonald, Dale Mcdonald NO.:  [id number]   MEDICAL RECORD NO.:  [id number]          PATIENT TYPE:  INP   LOCATION:  5528                         FACILITY:  [facility]   PHYSICIAN:  Acey Lav, MD  DATE OF BIRTH:  [birth year]   DATE OF CONSULTATION:  11/13/2008  DATE OF DISCHARGE:                                 CONSULTATION   DUPLICATE      Acey Lav, MD  Electronically Signed     CV/MEDQ  D:  11/13/2008  T:  11/13/2008  Job:  [id number]

## 2010-10-07 NOTE — Consult Note (Signed)
NAME:  Dale Mcdonald, Dale Mcdonald NO.:  [id number]   MEDICAL RECORD NO.:  [id number]          PATIENT TYPE:  INP   LOCATION:  5528                         FACILITY:  [facility]   PHYSICIAN:  Acey Lav, MD  DATE OF BIRTH:  Oct 06, 1950   DATE OF CONSULTATION:  11/13/2008  DATE OF DISCHARGE:                                 CONSULTATION   REASON FOR CONSULTATION:  Reason for Infectious Disease consultation is  fever of unknown origin, rash, and thrombocytopenia.   HISTORY OF PRESENT ILLNESS:  Dale Mcdonald is a 60 year old Caucasian male  with a past medical history significant for hypertension and smoking,  who developed acute onset of a febrile illness with a temperature up to  103, malaise, body aches, and an erythematous maculopapular rash on his  upper torso and arms.  There was concern for [facility] Spotted  Fever and the patient contacted Dr. Maple Hudson, who works with his wife, and  also Dr. Maurice March, my partner in Infectious Disease.  He was started on  doxycycline.  In the interim, his fevers resolved, but he still had  malaise profound fatigue.  He was evaluated by my partner, Dr. Maurice March, on  June 3rd.  Dr. Maurice March reviewed his past labs, which had included a  negative HIV antibody in June of 2009.  He reviewed the patient's recent  labs, which have included a white blood cell count that was up to 19,000  and then it had come down to 13,000, and also platelets that were  70,000.  Dr. Maurice March felt that the patient's symptoms were more consistent  with a febrile viral illness rather than [facility] Spotted Fever,  per se.  He did do a rapid Strep test, which was negative.  He also  checked cytomegalovirus antibodies and IgG was positive, IgM was  negative.  After seeing Dr. Maurice March, the patient then developed left lower  quadrant pain and had an ultrasound, which apparently showed some  gallstones.  I do not have that report in front of me, though.  The left  lower quadrant pain  resolved.  He continued to have persistent  temperatures of 99.5 or so up to 100 at night in particular,  occasionally with sweats, though not terribly drenching in nature.  More  than anything, he continued to have profound fatigue and difficulty  completing his work without having to take several naps in the day.  He  also developed some painful, swollen lymph nodes in his neck, which then  went down again and then reemerged.  Additionally, he continued to have  a dry throat at times and also a sensation of something in the back of  my throat.  He did not have dysphagia with solids or foods or any  regurgitation.  The patient has been followed also closely by his  primary care physician, Dr. Alphonsus Sias.  He had recently checked a prostate-  specific antigen, which was normal, a renal function panel, which was  unremarkable, a TSH, which was normal, hepatic function panel, which  showed elevated alkaline phosphatase of 147, AST and ALT  of 252 and 162.  The patient continued to feel more and more fatigued and again had some  swelling of his lymph nodes over the last couple days.  Due to his  worsening fatigue and desire to have a thorough workup into the cause of  his symptoms, the patient was admitted to the [facility]  under the care of Dr. Felicity Coyer.  We were consulted in Infectious Disease  to help work up this patient's low grade temperatures, thrombocytopenia,  and lymphadenopathy.   PAST MEDICAL HISTORY:  1. Hypertension.  2. Smoking history.Marland Kitchen   PAST SURGICAL HISTORY:  Patient had a history of hernia repair on the  right.   FAMILY HISTORY:  Patient's father died of lung cancer and was a smoker.  His mother died of HPV associated cancer.   SOCIAL HISTORY:  The patient is a smoker, smokes 1.5 packs per day, no  alcohol, no recreational drug use.  He has traveled throughout the  [place] including the [employer].  He has also traveled to  [place], [place], and  [place].  This travel history, however, is more  remote.  He lives in [place] and is married.   REVIEW OF SYSTEMS:  As described in the History Present Illness.  The  patient also apparently gained 7 pounds and then lost 5 pounds with a  net change of 12.  He has not had any vomiting or diarrhea, although he  has been diagnosed previously with irritable bowel syndrome..  Otherwise, 10-point review of systems is as in the History of Present  Illness; otherwise, negative.   PHYSICAL EXAMINATION:  VITAL SIGNS:  Temperature current is 98, blood  pressure 186/93, pulse 62, respirations 24, pulse-ox 99% room air.  He  weighs 85 kg.  GENERAL:  A pleasant gentleman in no acute distress.  HEENT:  Normocephalic, atraumatic.  Pupils equal, round, and react to  light.  Sclerae anicteric.  Oropharynx without erythema or exudate.  NECK:  A few tender, freely movable, cervical lymph nodes especially in  the anterior chain.  CARDIOVASCULAR:  Regular rate and rhythm.  No murmurs, gallops, or rubs.  LUNGS:  A few end-expiratory crackles at the bases; otherwise, clear.  ABDOMEN:  Soft and nondistended.  Liver palpated a few centimeters below  the right costal margin.  The spleen did not appear enlarged.  EXTREMITIES:  Without edema.  SKIN:  Remnants of a fading macular rash on his chest.  LYMPH NODES:  Cervical lymph nodes as described.  He had no  supraclavicular lymphadenopathy, no axillary lymphadenopathy, and no  inguinal lymphadenopathy.  NEUROLOGICAL:  Exam nonfocal.   LABORATORY DATA:  Labs are as described above.  Additionally, a chest x-  ray in 2005 showed no active lung disease.  His sedimentation rate on  arrival here is 23.  His comprehensive metabolic panel shows sodium 140,  potassium 4.1, chloride 105, bicarb 29, BUN and creatinine 6 and .79,  glucose 99.  Bilirubin 0.6, alk-phos 143, AST and ALT 99 and 49, albumin  3.1.  CBC with differential:  White count of 6.6, hemoglobin 14.3,   platelets 104,000.  ANC is 1.4, and his absolute lymphocyte count is  4.4.   IMPRESSION AND RECOMMENDATIONS:  This is a 60 year old Caucasian  gentleman with the onset approximately a month ago of acute febrile  illness with a macular rash with laboratory findings at that time  pertinent for leukocytosis and thrombocytopenia status post doxycycline  with initial improvement in his symptoms  of fevers but with persistence  in symptoms of fatigue, who has had evanescent cervical lymphadenopathy,  persistent fatigue, and thrombocytopenia with some mild elevation in his  liver function tests.  1. Fever of unknown origin.  Note the patient does not technically      meet the definition of fever of unknown origin as his temperatures      are not sufficiently high to meet any of the of the classifications      of fever of unknown origin in the original papers by or in any of      the reclassification schemes.  However, he does have a low-grade      pyrexia with temperatures in the 99 range.  He has certainly a      thrombocytopenia, which is not of any clear cause, and he does have      some lymphadenopathy without a clear cause either.  I am going to      work him up as a fever of unknown origin.  I think certainly his      past infection with hepatitis B last year is certainly a      possibility.  It has never been proven whether or not he has      cleared his acute hepatitis B infection.  So we will recheck his      hepatitis panel along with hepatitis B e antigen and hepatitis B      DNA.  We will check hepatitis C virus as well as cryoglobulins and      hepatitis A.  We will recheck him for HIV with an antibody test and      RNA.  I will check him for Epstein-Barr virus serologies, although      Epstein-Barr virus infection in a patient of his age is      exceptionally unusual.  I will consider getting a cytomegalovirus      PCR, although again I would find this highly unlikely in this       patient.  I do think it is worthwhile initiating imaging to look      for malignancy and getting a CT of the chest, abdomen, and pelvis      with IV and oral contrast.  Also, we will look for occult      abscesses.  I will check an ANA, I will check a sed rate, ANCA,      rheumatoid factor.  I will check an SPEP, and I will check some cat      scratch serologies for Bartonella henselae, although he really does      not have much in the way of a story due to suggest this and his      lymphadenopathy is fairly symmetrical and isolated to his cervical      lymph node chain on exam.  I will initiate further lab and workup      as needed.  A bone marrow biopsy might be helpful in him given his      thrombocytopenia without clear cause.  We will also check a      peripheral smear.  The rash has resolved.  2. Hepatitis as is read above.  We will recheck a hepatitis panel and      check in particular hepatitis B e antigen and a hepatitis B DNA.  3. Pain medications.  I would discourage him from using any Tylenol or      ibuprofen as this  may mask fevers in the hospital.   Thank you for this consultation.      Acey Lav, MD  Electronically Signed     CV/MEDQ  D:  11/13/2008  T:  11/13/2008  Job:  [id number]

## 2010-10-10 NOTE — Op Note (Signed)
NAME:  Dale Mcdonald, Dale Mcdonald                          ACCOUNT NO.:  [id number]   MEDICAL RECORD NO.:  [id number]                   PATIENT TYPE:  AMB   LOCATION:  DSC                                  FACILITY:  [facility]   PHYSICIAN:  Ollen Gross. Vernell Morgans, M.D.              DATE OF BIRTH:  14-Feb-1951   DATE OF PROCEDURE:  10/01/2003  DATE OF DISCHARGE:                                 OPERATIVE REPORT   PREOPERATIVE DIAGNOSIS:  Right inguinal hernia.   POSTOPERATIVE DIAGNOSIS:  Right direct inguinal hernia.   PROCEDURE:  Right inguinal hernia repair with mesh.   SURGEON:  Ollen Gross. Carolynne Edouard, M.D.   ANESTHESIA:  General via LMA.   PROCEDURE:  After informed consent was obtained, the patient was brought to  the operating room and placed in the supine position on the operating table.  After adequate induction of general anesthesia, the patient's right groin  and abdomen were prepped with Betadine and draped in the usual sterile  manner.  An incision was made from the edge of the pubic tubercle on the  right toward the anterior superior iliac spine for a distance of about 5 cm.  This incision was carried down through the skin and subcutaneous tissue  sharply with the Bovie electrocautery until the fascia of the external  oblique was encountered.  The external oblique fascia was opened along its  fibers toward the apex of the external ring sharply with the Metzenbaum  scissors.  Care was taken to avoid the ilioinguinal nerve beneath.  Once  this was accomplished a Weitlaner retractor was placed to retract fascia  superiorly and inferiorly.  Blunt dissection was then carried out along the  cord structures at the edge of the pubic tubercle until the cord structures  could be surrounded between two fingers.  A half-inch Penrose drain was then  placed around the cord structures for retraction purposes.  The cord was  skeletonized with gentle blunt dissection with a hemostat, and there was no  indirect  component of hernia sac identified.  There was an obvious bulge  medial to the cord structures at the floor of the inguinal canal that was  broad-based and small.  This was able to be reduced and the floor of the  inguinal canal was repaired with interrupted 2-0 Vicryl stitches.  Next a  piece of 3 x 6 Prolene mesh was used and cut to fit.  Tails were cut in the  mesh laterally.  The mesh was then sewn inferiorly to the shelving edge of  the inguinal ligament using a running 2-0 Prolene stitch.  The mesh was sewn  superiorly to the musculoaponeurotic fascial layer of the transversalis with  interrupted 2-0 Prolene vertical mattress sutures.  The tails of the mesh  were wrapped around the cord structures and anchored laterally to the  shelving edge of the inguinal ligament with an interrupted 2-0 Prolene  stitch.  Once this was accomplished, the repair was under no tension and  appeared to be in good position.  The wound was irrigated with copious  amounts of saline and appeared to be hemostatic.  The external oblique  fascia was then reapproximated with a running 2-0 Vicryl stitch.  The wound  was then infiltrated with 0.25% Marcaine.  The subcutaneous fascia was  closed with a running 3-0 Vicryl stitch and the skin was closed with a  running 4-0 Monocryl subcuticular stitch.  Benzoin and Steri-Strips and  sterile dressings were applied.  The patient tolerated the procedure well.  At the end of the case all needle, sponge, and instrument counts were  correct.  The patient was then awakened and taken to the recovery room in  stable condition.                                               Ollen Gross. Vernell Morgans, M.D.   PST/MEDQ  D:  10/01/2003  T:  10/01/2003  Job:  [id number]

## 2010-10-10 NOTE — Discharge Summary (Signed)
NAME:  Dale Mcdonald, Dale Mcdonald NO.:  [id number]   MEDICAL RECORD NO.:  [id number]          PATIENT TYPE:  INP   LOCATION:  5528                         FACILITY:  [facility]   PHYSICIAN:  Rosalyn Gess. Norins, MD  DATE OF BIRTH:  [birth year]   DATE OF ADMISSION:  11/13/2008  DATE OF DISCHARGE:  11/15/2008                               DISCHARGE SUMMARY   ADMITTING DIAGNOSIS:  Fevers of unknown origin.   DISCHARGE DIAGNOSIS:  Fever of unknown origin.   HISTORY OF PRESENT ILLNESS:  The patient presents with a 4-week history  of extreme fatigue, severe sore throat, muscle aches, low-grade fever,  and rash of the anterior chest with nausea, and diffuse lymphadenopathy.  The patient had been seen over the past 2 weeks by Dr. Darlina Sicilian of the  ID service and had a negative strep test.  He was also followed by Dr.  Alphonsus Sias as an outpatient.  At the time of admission, the patient noted  left-sided abdominal pain that was severe, but this was improved at the  time of admission.  Please see the admission note for past medical  history, family history, and social history.   HOSPITAL COURSE:  The patient was seen in consultation by the ID service  by Dr. Daiva Eves.  The patient felt well when I first saw him on January 14, 2009, and had no fever at that time.  Laboratory was negative  including CT chest was negative, CT abdomen negative, CT pelvis  negative.  Cryptococcal antigen was negative.  CRP was 0.7.  ESR was 23.  Rheumatoid factor was less than 20.  Of note, the patient did have a  positive HIV ELISA study.  Confirmatory Western blot was pending at the  time of discharge.  The patient adamantly denied any risk factors.  It  was felt that the patient since additional studies including anti-  hepatitis A IgM was negative, hepatitis B service antigen was negative,  anti-HCV was negative, and he had a normal exam that there was no need  for him to be kept in hospital with being afebrile and  medically stable.  The plan was for Infectious Disease to follow up with the patient in  regards to his Western blot where he would then be seen in the ID  clinic.   PHYSICAL EXAMINATION:  The patient's examination at the last evaluation  by this dictator:  VITAL SIGNS:  Temperature was 98, blood pressure 136/79, heart rate 65,  respirations 20, O2 sat 94% on room air.  GENERAL APPEARANCE:  Well-nourished gentleman in no acute distress.  CHEST:  Clear with no rales, wheezes, or rhonchi.  CARDIOVASCULAR:  Unremarkable.  ABDOMEN:  Soft.  No guarding or rebound.  No organosplenomegaly was  appreciated.  EXTREMITIES:  Without edema.   LABORATORY DATA:  As noted above were all negative.   DISPOSITION:  The patient is discharged home.  He is to follow up with  the Infectious Disease Clinic.  To resume all of his home medications  without change, which is basically just lisinopril for blood pressure.  He will follow up with Dr. Alphonsus Sias in addition to the ID clinic.      Rosalyn Gess Norins, MD  Electronically Signed     MEN/MEDQ  D:  01/20/2009  T:  01/21/2009  Job:  [id number]

## 2010-10-15 ENCOUNTER — Encounter: Payer: Self-pay | Admitting: *Deleted

## 2010-10-15 ENCOUNTER — Ambulatory Visit (INDEPENDENT_AMBULATORY_CARE_PROVIDER_SITE_OTHER): Payer: Self-pay | Admitting: *Deleted

## 2010-10-15 VITALS — BP 141/91 | HR 64 | Temp 98.2°F | Resp 15 | Ht 71.5 in | Wt 200.0 lb

## 2010-10-15 DIAGNOSIS — B20 Human immunodeficiency virus [HIV] disease: Secondary | ICD-10-CM

## 2010-10-15 LAB — LIPID PANEL
Cholesterol: 151 mg/dL (ref 0–200)
LDL Cholesterol: 85 mg/dL (ref 0–99)
VLDL: 26 mg/dL (ref 0–40)

## 2010-10-15 LAB — HEPATIC FUNCTION PANEL
ALT: 53 U/L (ref 0–53)
Bilirubin, Direct: 0.4 mg/dL — ABNORMAL HIGH (ref 0.0–0.3)
Indirect Bilirubin: 2.1 mg/dL — ABNORMAL HIGH (ref 0.0–0.9)

## 2010-10-15 LAB — BASIC METABOLIC PANEL
BUN: 10 mg/dL (ref 6–23)
Glucose, Bld: 103 mg/dL — ABNORMAL HIGH (ref 70–99)
Potassium: 4 mEq/L (ref 3.5–5.3)

## 2010-10-15 NOTE — Progress Notes (Signed)
10/15/10 @ 07:30: Pt here for Study A5257/A5001, week 96. Pt stated he is feeling well and [place]. No new findings. Assessment unchanged since last study visit. Pt received $20 gift card for visit and $140 for co-pay reimbursement. Pt received study medications. Next study visit scheduled for 02/06/2011 @ 0730. -- Tacey Heap RN

## 2010-10-16 LAB — URINALYSIS
Hgb urine dipstick: NEGATIVE
Leukocytes, UA: NEGATIVE
Nitrite: NEGATIVE
Protein, ur: NEGATIVE mg/dL

## 2010-10-16 LAB — HEPATITIS B SURFACE ANTIBODY,QUALITATIVE: Hep B S Ab: NEGATIVE

## 2010-10-16 LAB — HEPATITIS B SURFACE ANTIGEN: Hepatitis B Surface Ag: POSITIVE — AB

## 2010-10-17 LAB — HEPATITIS A ANTIBODY, TOTAL: Hep A Total Ab: POSITIVE — AB

## 2010-10-27 ENCOUNTER — Other Ambulatory Visit: Payer: Self-pay | Admitting: *Deleted

## 2010-10-27 DIAGNOSIS — E7800 Pure hypercholesterolemia, unspecified: Secondary | ICD-10-CM

## 2010-10-27 MED ORDER — ATORVASTATIN CALCIUM 10 MG PO TABS: 10.0000 mg | ORAL_TABLET | Freq: Every day | ORAL | Status: DC

## 2010-11-05 ENCOUNTER — Encounter: Payer: Self-pay | Admitting: Infectious Disease

## 2010-11-05 LAB — HIV-1 RNA QUANT-NO REFLEX-BLD: HIV-1 RNA Viral Load: <40

## 2010-12-08 ENCOUNTER — Encounter: Payer: Self-pay | Admitting: Infectious Disease

## 2010-12-08 ENCOUNTER — Ambulatory Visit (INDEPENDENT_AMBULATORY_CARE_PROVIDER_SITE_OTHER): Payer: 59 | Admitting: Infectious Disease

## 2010-12-08 DIAGNOSIS — B20 Human immunodeficiency virus [HIV] disease: Secondary | ICD-10-CM

## 2010-12-08 DIAGNOSIS — B1910 Unspecified viral hepatitis B without hepatic coma: Secondary | ICD-10-CM

## 2010-12-08 DIAGNOSIS — G4700 Insomnia, unspecified: Secondary | ICD-10-CM

## 2010-12-08 MED ORDER — ZOLPIDEM TARTRATE 10 MG PO TABS: 10.0000 mg | ORAL_TABLET | Freq: Every evening | ORAL | Status: DC | PRN

## 2010-12-09 NOTE — Assessment & Plan Note (Signed)
Renewed his [place]

## 2010-12-09 NOTE — Assessment & Plan Note (Signed)
Perfectly suppressed.

## 2010-12-09 NOTE — Assessment & Plan Note (Signed)
On truvada 

## 2010-12-09 NOTE — Progress Notes (Signed)
  Subjective:    Patient ID: Dale Mcdonald, male    DOB: [birth year], 60 y.o.   MRN: [id number]  HPI  60 year old male with HIV and Hep B coinfection currently superbly controlled and with high cd4 of 999. He is doing relatively well. Unfortunately his wife suffered a traumatic fall with apparent "brain damage." This is causing him a great deal of distress. He also inquired about zostavax vaccine. Otherwise he is doing well.  Review of Systems  Constitutional: Negative for fever, chills, diaphoresis, activity change, appetite change, fatigue and unexpected weight change.  HENT: Negative for congestion, sore throat, rhinorrhea, sneezing, trouble swallowing and sinus pressure.   Eyes: Negative for photophobia and visual disturbance.  Respiratory: Negative for cough, chest tightness, shortness of breath, wheezing and stridor.   Cardiovascular: Negative for chest pain, palpitations and leg swelling.  Gastrointestinal: Negative for nausea, vomiting, abdominal pain, diarrhea, constipation, blood in stool, abdominal distention and anal bleeding.  Genitourinary: Negative for dysuria, hematuria, flank pain and difficulty urinating.  Musculoskeletal: Negative for myalgias, back pain, joint swelling, arthralgias and gait problem.  Skin: Negative for color change, pallor, rash and wound.  Neurological: Negative for dizziness, tremors, weakness and light-headedness.  Hematological: Negative for adenopathy. Does not bruise/bleed easily.  Psychiatric/Behavioral: Negative for behavioral problems, confusion, sleep disturbance, dysphoric mood, decreased concentration and agitation.       Objective:   Physical Exam  Constitutional: He is oriented to person, place, and time. He appears well-developed and well-nourished. No distress.  HENT:  Head: Normocephalic and atraumatic.  Mouth/Throat: Oropharynx is clear and moist. No oropharyngeal exudate.  Eyes: Conjunctivae and EOM are normal. Pupils are equal,  round, and reactive to light. No scleral icterus.  Neck: Normal range of motion. Neck supple. No JVD present.  Cardiovascular: Normal rate, regular rhythm and normal heart sounds.  Exam reveals no gallop and no friction rub.   No murmur heard. Pulmonary/Chest: Effort normal and breath sounds normal. No respiratory distress. He has no wheezes. He has no rales. He exhibits no tenderness.  Abdominal: He exhibits no distension and no mass. There is no tenderness. There is no rebound and no guarding.  Musculoskeletal: He exhibits no edema and no tenderness.  Lymphadenopathy:    He has no cervical adenopathy.  Neurological: He is alert and oriented to person, place, and time. He has normal reflexes. He exhibits normal muscle tone. Coordination normal.  Skin: Skin is warm and dry. He is not diaphoretic. No erythema. No pallor.  Psychiatric: He has a normal mood and affect. His behavior is normal. Judgment and thought content normal.          Assessment & Plan:

## 2010-12-10 ENCOUNTER — Ambulatory Visit: Payer: Self-pay | Admitting: Infectious Disease

## 2011-01-12 ENCOUNTER — Other Ambulatory Visit: Payer: Self-pay | Admitting: *Deleted

## 2011-01-12 MED ORDER — LISINOPRIL-HYDROCHLOROTHIAZIDE 20-25 MG PO TABS: 1.0000 | ORAL_TABLET | Freq: Every day | ORAL | Status: DC

## 2011-02-06 ENCOUNTER — Ambulatory Visit (INDEPENDENT_AMBULATORY_CARE_PROVIDER_SITE_OTHER): Payer: Self-pay | Admitting: *Deleted

## 2011-02-06 VITALS — BP 140/84 | HR 60 | Temp 98.2°F | Resp 16 | Wt 195.8 lb

## 2011-02-06 DIAGNOSIS — B20 Human immunodeficiency virus [HIV] disease: Secondary | ICD-10-CM

## 2011-02-06 LAB — PHOSPHORUS: Phosphorus: 2.3 mg/dL (ref 2.3–4.6)

## 2011-02-06 LAB — COMPREHENSIVE METABOLIC PANEL
Albumin: 4.5 g/dL (ref 3.5–5.2)
Alkaline Phosphatase: 107 U/L (ref 39–117)
BUN: 11 mg/dL (ref 6–23)
Glucose, Bld: 126 mg/dL — ABNORMAL HIGH (ref 70–99)
Potassium: 4.3 mEq/L (ref 3.5–5.3)
Total Bilirubin: 2.5 mg/dL — ABNORMAL HIGH (ref 0.3–1.2)

## 2011-02-06 LAB — LIPID PANEL
Cholesterol: 146 mg/dL (ref 0–200)
HDL: 30 mg/dL — ABNORMAL LOW (ref >39)
Total CHOL/HDL Ratio: 4.9 Ratio

## 2011-02-06 NOTE — Progress Notes (Signed)
Patient here for his week 112 study visit. He is doing very well, no major complaints. Has noticed some increased abd. Girth he is concerned about and is considering treatment for it. He also wants to get a zostavax at his next appt with Dr. Daiva Eves. Will return in January for his next appt. He will be coming off study in the late Spring and will prescriptions for all his meds at that time.

## 2011-02-11 ENCOUNTER — Telehealth: Payer: Self-pay | Admitting: *Deleted

## 2011-02-11 NOTE — Telephone Encounter (Signed)
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 States he has a hx of this. His wife is a Engineer, civil (consulting) & states this is what it is. Has Dr. Cloria Spring as ENT. Has seen him before but it is hard to get an appt when the herpes break out. He is taking acyclovir 800 mg tid ansd an OTC steroidal creme in the ear. Wants to klnow if this is ok. I asked him to call Dr. Lazarus Salines to ask same question & to see if he can be seen. He states he would but wants this md opinion

## 2011-02-19 NOTE — Telephone Encounter (Signed)
Acyclovir 800mg  FIVE TIMES DAILY WOULD BE THE CORRECT DOSE FOR THIS VS VALTREX 1GRAM THREE TIMES DAILY. I LEFT MESSAGE FOR PT ON HIS CELL PHONE

## 2011-02-26 ENCOUNTER — Encounter: Payer: Self-pay | Admitting: Infectious Disease

## 2011-02-26 LAB — CD4/CD8 (T-HELPER/T-SUPPRESSOR CELL)
CD4%: 44.2
CD4: 928

## 2011-02-26 LAB — HIV-1 RNA QUANT-NO REFLEX-BLD: HIV-1 RNA Viral Load: <40

## 2011-03-03 ENCOUNTER — Ambulatory Visit (INDEPENDENT_AMBULATORY_CARE_PROVIDER_SITE_OTHER): Payer: BC Managed Care – PPO | Admitting: Infectious Disease

## 2011-03-03 ENCOUNTER — Ambulatory Visit: Payer: 59 | Admitting: Infectious Disease

## 2011-03-03 ENCOUNTER — Encounter: Payer: Self-pay | Admitting: Infectious Disease

## 2011-03-03 ENCOUNTER — Telehealth: Payer: Self-pay | Admitting: *Deleted

## 2011-03-03 VITALS — BP 163/82 | HR 68 | Temp 98.4°F | Wt 193.0 lb

## 2011-03-03 DIAGNOSIS — B20 Human immunodeficiency virus [HIV] disease: Secondary | ICD-10-CM

## 2011-03-03 DIAGNOSIS — E785 Hyperlipidemia, unspecified: Secondary | ICD-10-CM

## 2011-03-03 DIAGNOSIS — I10 Essential (primary) hypertension: Secondary | ICD-10-CM

## 2011-03-03 DIAGNOSIS — B0221 Postherpetic geniculate ganglionitis: Secondary | ICD-10-CM

## 2011-03-03 DIAGNOSIS — B181 Chronic viral hepatitis B without delta-agent: Secondary | ICD-10-CM

## 2011-03-03 DIAGNOSIS — B028 Zoster with other complications: Secondary | ICD-10-CM

## 2011-03-03 MED ORDER — AMLODIPINE BESYLATE 5 MG PO TABS: 5.0000 mg | ORAL_TABLET | Freq: Every day | ORAL | Status: DC

## 2011-03-03 MED ORDER — VALACYCLOVIR HCL 1 G PO TABS: 1000.0000 mg | ORAL_TABLET | Freq: Three times a day (TID) | ORAL | Status: DC

## 2011-03-03 MED ORDER — RITONAVIR 100 MG PO CAPS: 100.0000 mg | ORAL_CAPSULE | Freq: Every day | ORAL | Status: DC

## 2011-03-03 MED ORDER — VALACYCLOVIR HCL 500 MG PO TABS: 500.0000 mg | ORAL_TABLET | Freq: Every day | ORAL | Status: DC

## 2011-03-03 NOTE — Assessment & Plan Note (Signed)
Doing fantastic on reyataz, norvir and truvada. When he comes off study will likely consider at single tablet regimen such as atripla, stribild or complera

## 2011-03-03 NOTE — Assessment & Plan Note (Signed)
Add amlodipine. 

## 2011-03-03 NOTE — Assessment & Plan Note (Signed)
At goal.  

## 2011-03-03 NOTE — Telephone Encounter (Signed)
rec'd a fax from [facility] stating that this med needed to be prior auth from his prescription company. I called [phone] id # is [id number]. He does not need a prior [place]

## 2011-03-03 NOTE — Progress Notes (Signed)
  Subjective:    Patient ID: Dale Mcdonald, male    DOB: Mar 11, 1951, 60 y.o.   MRN: [id number]  HPI  60 year old with HIV superbly well controlled on boosted reyataz, norvir and truvada via ACTG 5257. He returns for routine followup. He appears to have had yet another episode of zoster oticus and took high dose valtrex for this with resolution in his lesions. He is still smoking vigorously but is under a great deal of stress related to medical issues with his wife who has had pathological fractures and also suffered TBI.  Review of Systems  Constitutional: Negative for fever, chills, diaphoresis, activity change, appetite change, fatigue and unexpected weight change.  HENT: Negative for congestion, sore throat, rhinorrhea, sneezing, trouble swallowing and sinus pressure.   Eyes: Negative for photophobia and visual disturbance.  Respiratory: Negative for cough, chest tightness, shortness of breath, wheezing and stridor.   Cardiovascular: Negative for chest pain, palpitations and leg swelling.  Gastrointestinal: Negative for nausea, vomiting, abdominal pain, diarrhea, constipation, blood in stool, abdominal distention and anal bleeding.  Genitourinary: Negative for dysuria, hematuria, flank pain and difficulty urinating.  Musculoskeletal: Negative for myalgias, back pain, joint swelling, arthralgias and gait problem.  Skin: Positive for rash. Negative for color change, pallor and wound.  Neurological: Negative for dizziness, tremors, weakness and light-headedness.  Hematological: Negative for adenopathy. Does not bruise/bleed easily.  Psychiatric/Behavioral: Negative for behavioral problems, confusion, sleep disturbance, dysphoric mood, decreased concentration and agitation.       Objective:   Physical Exam  Constitutional: He is oriented to person, place, and time. He appears well-developed and well-nourished. No distress.  HENT:  Head: Normocephalic and atraumatic.  Mouth/Throat:  Oropharynx is clear and moist. No oropharyngeal exudate.  Eyes: Conjunctivae and EOM are normal. Pupils are equal, round, and reactive to light. No scleral icterus.  Neck: Normal range of motion. Neck supple. No JVD present.  Cardiovascular: Normal rate, regular rhythm and normal heart sounds.  Exam reveals no gallop and no friction rub.   No murmur heard. Pulmonary/Chest: Effort normal and breath sounds normal. No respiratory distress. He has no wheezes. He has no rales. He exhibits no tenderness.  Abdominal: He exhibits no distension and no mass. There is no tenderness. There is no rebound and no guarding.  Musculoskeletal: He exhibits no edema and no tenderness.  Lymphadenopathy:    He has no cervical adenopathy.  Neurological: He is alert and oriented to person, place, and time. He has normal reflexes. He exhibits normal muscle tone. Coordination normal.  Skin: Skin is warm and dry. He is not diaphoretic. No erythema. No pallor.  Psychiatric: He has a normal mood and affect. His behavior is normal. Judgment and thought content normal.          Assessment & Plan:  HIV INFECTION Doing fantastic on reyataz, norvir and truvada. When he comes off study will likely consider at single tablet regimen such as atripla, stribild or complera  HEPATITIS B, CHRONIC On truvada  Herpes zoster oticus Will put him on prophylactic valtrex. Will try to get him the zostavax vaccine  HYPERLIPIDEMIA At goal  HYPERTENSION Add amlodipine

## 2011-03-03 NOTE — Assessment & Plan Note (Signed)
Will put him on prophylactic valtrex. Will try to get him the zostavax vaccine

## 2011-03-03 NOTE — Assessment & Plan Note (Signed)
On truvada 

## 2011-03-04 ENCOUNTER — Other Ambulatory Visit: Payer: Self-pay | Admitting: Infectious Disease

## 2011-03-04 DIAGNOSIS — B0221 Postherpetic geniculate ganglionitis: Secondary | ICD-10-CM

## 2011-03-04 NOTE — Progress Notes (Signed)
I have put in order for zostavax for [name]. Selena Batten can you let him know it has been called in to his [facility] pharmacy

## 2011-03-09 ENCOUNTER — Other Ambulatory Visit: Payer: Self-pay | Admitting: *Deleted

## 2011-03-09 DIAGNOSIS — B20 Human immunodeficiency virus [HIV] disease: Secondary | ICD-10-CM

## 2011-03-09 DIAGNOSIS — B0221 Postherpetic geniculate ganglionitis: Secondary | ICD-10-CM

## 2011-03-09 DIAGNOSIS — I10 Essential (primary) hypertension: Secondary | ICD-10-CM

## 2011-03-09 MED ORDER — ATAZANAVIR SULFATE 300 MG PO CAPS: 300.0000 mg | ORAL_CAPSULE | Freq: Every day | ORAL | Status: DC

## 2011-03-09 MED ORDER — EMTRICITABINE-TENOFOVIR DF 200-300 MG PO TABS: 1.0000 | ORAL_TABLET | Freq: Every day | ORAL | Status: DC

## 2011-03-09 MED ORDER — RITONAVIR 100 MG PO CAPS: 100.0000 mg | ORAL_CAPSULE | Freq: Every day | ORAL | Status: DC

## 2011-03-10 ENCOUNTER — Other Ambulatory Visit: Payer: Self-pay | Admitting: *Deleted

## 2011-03-10 DIAGNOSIS — B0221 Postherpetic geniculate ganglionitis: Secondary | ICD-10-CM

## 2011-03-10 DIAGNOSIS — B20 Human immunodeficiency virus [HIV] disease: Secondary | ICD-10-CM

## 2011-03-10 DIAGNOSIS — I10 Essential (primary) hypertension: Secondary | ICD-10-CM

## 2011-03-10 MED ORDER — RITONAVIR 100 MG PO CAPS: 100.0000 mg | ORAL_CAPSULE | Freq: Every day | ORAL | Status: DC

## 2011-04-26 ENCOUNTER — Other Ambulatory Visit: Payer: Self-pay | Admitting: Infectious Disease

## 2011-04-26 DIAGNOSIS — E7800 Pure hypercholesterolemia, unspecified: Secondary | ICD-10-CM

## 2011-05-28 ENCOUNTER — Ambulatory Visit (INDEPENDENT_AMBULATORY_CARE_PROVIDER_SITE_OTHER): Payer: BC Managed Care – PPO | Admitting: *Deleted

## 2011-05-28 VITALS — BP 125/77 | HR 68 | Temp 98.2°F | Resp 18 | Wt 194.0 lb

## 2011-05-28 DIAGNOSIS — B20 Human immunodeficiency virus [HIV] disease: Secondary | ICD-10-CM

## 2011-05-28 LAB — HEPATIC FUNCTION PANEL
AST: 36 U/L (ref 0–37)
Bilirubin, Direct: 0.5 mg/dL — ABNORMAL HIGH (ref 0.0–0.3)
Total Bilirubin: 3.4 mg/dL — ABNORMAL HIGH (ref 0.3–1.2)

## 2011-05-28 LAB — BASIC METABOLIC PANEL
CO2: 30 mEq/L (ref 19–32)
Calcium: 9.8 mg/dL (ref 8.4–10.5)
Chloride: 97 mEq/L (ref 96–112)
Glucose, Bld: 110 mg/dL — ABNORMAL HIGH (ref 70–99)
Potassium: 3.7 mEq/L (ref 3.5–5.3)
Sodium: 136 mEq/L (ref 135–145)

## 2011-05-28 LAB — LIPID PANEL
LDL Cholesterol: 69 mg/dL (ref 0–99)
Total CHOL/HDL Ratio: 3.9 Ratio

## 2011-05-28 NOTE — Progress Notes (Signed)
Pt here for A5257/A5001 week 128. Pt denies any new problems, symptoms. Changes in medication include starting Norvasc 5mg  PO QAM. Vital signs are stable. Informed patient that his last study visit will be his next. Pt has [employer] for insurance and will check to see what his copay will be for his ARVs. Pt verbalized understanding and did ask if there were any other studies that he could potentially participate in. I told him I would let him know. Nonfasting labs were drawn. Pt given $20.00 gift card for study visit and $140.00 for medication reimbursement. Next study visit scheduled for Tuesday, September 15, 2011 at 7:30am. -- Tacey Heap RN

## 2011-05-29 ENCOUNTER — Encounter: Payer: Self-pay | Admitting: *Deleted

## 2011-06-02 ENCOUNTER — Other Ambulatory Visit: Payer: Self-pay | Admitting: *Deleted

## 2011-06-02 DIAGNOSIS — B20 Human immunodeficiency virus [HIV] disease: Secondary | ICD-10-CM

## 2011-06-02 MED ORDER — RITONAVIR 100 MG PO TABS: 100.0000 mg | ORAL_TABLET | Freq: Every day | ORAL | Status: DC

## 2011-06-02 NOTE — Telephone Encounter (Signed)
Needing new rx sent to pharmacy for 90-day Norvir 100 mg tablet rx.

## 2011-06-08 ENCOUNTER — Other Ambulatory Visit: Payer: Self-pay | Admitting: *Deleted

## 2011-06-08 DIAGNOSIS — B20 Human immunodeficiency virus [HIV] disease: Secondary | ICD-10-CM

## 2011-06-08 MED ORDER — EMTRICITABINE-TENOFOVIR DF 200-300 MG PO TABS: 1.0000 | ORAL_TABLET | Freq: Every day | ORAL | Status: DC

## 2011-06-08 MED ORDER — ATAZANAVIR SULFATE 300 MG PO CAPS: 300.0000 mg | ORAL_CAPSULE | Freq: Every day | ORAL | Status: DC

## 2011-06-12 ENCOUNTER — Encounter: Payer: Self-pay | Admitting: Infectious Disease

## 2011-06-12 LAB — CD4/CD8 (T-HELPER/T-SUPPRESSOR CELL)
CD4%: 43
CD4: 946
CD8 % Suppressor T Cell: 38.2

## 2011-06-17 ENCOUNTER — Ambulatory Visit (INDEPENDENT_AMBULATORY_CARE_PROVIDER_SITE_OTHER): Payer: BC Managed Care – PPO | Admitting: Infectious Disease

## 2011-06-17 ENCOUNTER — Encounter: Payer: Self-pay | Admitting: Infectious Disease

## 2011-06-17 DIAGNOSIS — E785 Hyperlipidemia, unspecified: Secondary | ICD-10-CM

## 2011-06-17 DIAGNOSIS — I10 Essential (primary) hypertension: Secondary | ICD-10-CM

## 2011-06-17 DIAGNOSIS — B20 Human immunodeficiency virus [HIV] disease: Secondary | ICD-10-CM

## 2011-06-17 DIAGNOSIS — Z789 Other specified health status: Secondary | ICD-10-CM

## 2011-06-17 DIAGNOSIS — F17200 Nicotine dependence, unspecified, uncomplicated: Secondary | ICD-10-CM

## 2011-06-17 NOTE — Assessment & Plan Note (Signed)
Until we have travel clinic set up on site, wll need go to [facility]. It is recommended that he get typhoid fever vaccine and IPV

## 2011-06-17 NOTE — Patient Instructions (Addendum)
I would consider one of the following:  atripla  Or   complera  Or   stribild  Before you go to [place]  YOu should get the following vaccines:  Typhoid  Inactivated polio

## 2011-06-17 NOTE — Assessment & Plan Note (Signed)
counselled to quit 

## 2011-06-17 NOTE — Assessment & Plan Note (Signed)
At goal.  

## 2011-06-17 NOTE — Assessment & Plan Note (Signed)
Perfect suppression. He will consider single tablet regimens of complera, atripla and stribild

## 2011-06-17 NOTE — Progress Notes (Signed)
  Subjective:    Patient ID: Dale Mcdonald, male    DOB: [birth year], 61 y.o.   MRN: [id number]  HPI  Dale Mcdonald is a 61 y.o. male who is doing superbly well on his antiviral regimen, norvir reyataz and truvada with undetectable viral load and health cd4 count. He does suffer some icterus at times. He does continue to smoke. He is getting ready for travel to [place] and I made recs re vaccinations to [place]. We again reviewed all [facility] ARV regimens for him. I spent greater than 45 minutes with the patient including greater than 50% of time in face to face counsel of the patient and in coordination of their care.    Review of Systems  Constitutional: Negative for fever, chills, diaphoresis, activity change, appetite change, fatigue and unexpected weight change.  HENT: Negative for congestion, sore throat, rhinorrhea, sneezing, trouble swallowing and sinus pressure.   Eyes: Negative for photophobia and visual disturbance.  Respiratory: Negative for cough, chest tightness, shortness of breath, wheezing and stridor.   Cardiovascular: Negative for chest pain, palpitations and leg swelling.  Gastrointestinal: Negative for nausea, vomiting, abdominal pain, diarrhea, constipation, blood in stool, abdominal distention and anal bleeding.  Genitourinary: Negative for dysuria, hematuria, flank pain and difficulty urinating.  Musculoskeletal: Negative for myalgias, back pain, joint swelling, arthralgias and gait problem.  Skin: Negative for color change, pallor, rash and wound.  Neurological: Negative for dizziness, tremors, weakness and light-headedness.  Hematological: Negative for adenopathy. Does not bruise/bleed easily.  Psychiatric/Behavioral: Negative for behavioral problems, confusion, sleep disturbance, dysphoric mood, decreased concentration and agitation.       Objective:   Physical Exam  Constitutional: He is oriented to person, place, and time. He appears well-developed and well-nourished.  No distress.  HENT:  Head: Normocephalic and atraumatic.  Mouth/Throat: Oropharynx is clear and moist. No oropharyngeal exudate.  Eyes: Conjunctivae and EOM are normal. Pupils are equal, round, and reactive to light. No scleral icterus.  Neck: Normal range of motion. Neck supple. No JVD present.  Cardiovascular: Normal rate, regular rhythm and normal heart sounds.  Exam reveals no gallop and no friction rub.   No murmur heard. Pulmonary/Chest: Effort normal and breath sounds normal. No respiratory distress. He has no wheezes. He has no rales. He exhibits no tenderness.  Abdominal: He exhibits no distension and no mass. There is no tenderness. There is no rebound and no guarding.  Musculoskeletal: He exhibits no edema and no tenderness.  Lymphadenopathy:    He has no cervical adenopathy.  Neurological: He is alert and oriented to person, place, and time. He has normal reflexes. He exhibits normal muscle tone. Coordination normal.  Skin: Skin is warm and dry. He is not diaphoretic. No erythema. No pallor.  Psychiatric: He has a normal mood and affect. His behavior is normal. Judgment and thought content normal.          Assessment & Plan:  HIV INFECTION Perfect suppression. He will consider single tablet regimens of complera, atripla and stribild  Travel foreign Until we have travel clinic set up on site, wll need go to [facility]. It is recommended that he get typhoid fever vaccine and IPV  SMOKER counselled to quit  HYPERLIPIDEMIA At goal  HYPERTENSION bp may need more tightening up in future

## 2011-06-17 NOTE — Assessment & Plan Note (Signed)
bp may need more tightening up in future

## 2011-06-18 ENCOUNTER — Encounter: Payer: Self-pay | Admitting: Infectious Disease

## 2011-07-13 ENCOUNTER — Other Ambulatory Visit: Payer: Self-pay | Admitting: *Deleted

## 2011-07-13 DIAGNOSIS — G4700 Insomnia, unspecified: Secondary | ICD-10-CM

## 2011-07-13 MED ORDER — ZOLPIDEM TARTRATE 10 MG PO TABS: 10.0000 mg | ORAL_TABLET | Freq: Every evening | ORAL | Status: DC | PRN

## 2011-09-15 ENCOUNTER — Telehealth: Payer: Self-pay | Admitting: Infectious Disease

## 2011-09-15 ENCOUNTER — Encounter: Payer: Self-pay | Admitting: *Deleted

## 2011-09-15 ENCOUNTER — Ambulatory Visit (INDEPENDENT_AMBULATORY_CARE_PROVIDER_SITE_OTHER): Payer: Self-pay | Admitting: *Deleted

## 2011-09-15 VITALS — BP 130/78 | HR 70 | Temp 98.0°F | Resp 16 | Ht 71.0 in | Wt 193.2 lb

## 2011-09-15 DIAGNOSIS — Z21 Asymptomatic human immunodeficiency virus [HIV] infection status: Secondary | ICD-10-CM

## 2011-09-15 DIAGNOSIS — B20 Human immunodeficiency virus [HIV] disease: Secondary | ICD-10-CM

## 2011-09-15 LAB — PROTEIN, URINE, RANDOM: Total Protein, Urine: 3 mg/dL

## 2011-09-15 LAB — BASIC METABOLIC PANEL
CO2: 28 mEq/L (ref 19–32)
Calcium: 10 mg/dL (ref 8.4–10.5)
Potassium: 4.1 mEq/L (ref 3.5–5.3)
Sodium: 137 mEq/L (ref 135–145)

## 2011-09-15 LAB — LIPID PANEL
HDL: 35 mg/dL — ABNORMAL LOW (ref >39)
LDL Cholesterol: 78 mg/dL (ref 0–99)
Total CHOL/HDL Ratio: 4.2 Ratio

## 2011-09-15 LAB — POCT URINALYSIS DIPSTICK
Glucose, UA: NEGATIVE
Ketones, UA: NEGATIVE
Spec Grav, UA: 1.01

## 2011-09-15 LAB — PHOSPHORUS: Phosphorus: 3 mg/dL (ref 2.3–4.6)

## 2011-09-15 LAB — HEPATIC FUNCTION PANEL
Bilirubin, Direct: 0.4 mg/dL — ABNORMAL HIGH (ref 0.0–0.3)
Indirect Bilirubin: 2.6 mg/dL — ABNORMAL HIGH (ref 0.0–0.9)

## 2011-09-15 LAB — CREATININE, URINE, RANDOM: Creatinine, Urine: 42.5 mg/dL

## 2011-09-15 MED ORDER — EFAVIRENZ-EMTRICITAB-TENOFOVIR 600-200-300 MG PO TABS: 1.0000 | ORAL_TABLET | Freq: Every day | ORAL | Status: DC

## 2011-09-15 NOTE — Progress Notes (Signed)
Patient here for week 144 study visit. This is his final visit for A5257 and he will not be receiving anymore meds through the study. He would like to switch to Atripla and made an appt to see Dr. Daiva Eves but he couldn't get in to see him until May 22nd.  He can continue in one of the other observational studies ([facility]) in September. He denies any new problems or medications and continues to be very adherent with his meds.

## 2011-09-15 NOTE — Telephone Encounter (Signed)
Pt wishes to be on atripla now that he is out of ACTG 5257

## 2011-10-09 ENCOUNTER — Encounter: Payer: Self-pay | Admitting: Infectious Disease

## 2011-10-09 LAB — HIV-1 RNA QUANT-NO REFLEX-BLD: HIV-1 RNA Viral Load: <40

## 2011-10-09 LAB — CD4/CD8 (T-HELPER/T-SUPPRESSOR CELL): CD4: 811

## 2011-10-13 ENCOUNTER — Telehealth: Payer: Self-pay | Admitting: *Deleted

## 2011-10-13 NOTE — Telephone Encounter (Signed)
Called patient and reminded him of appt with Dr. Van Dam for tomorrow. Alyssamae Klinck  

## 2011-10-14 ENCOUNTER — Encounter: Payer: Self-pay | Admitting: Infectious Disease

## 2011-10-14 ENCOUNTER — Ambulatory Visit (INDEPENDENT_AMBULATORY_CARE_PROVIDER_SITE_OTHER): Payer: BC Managed Care – PPO | Admitting: Infectious Disease

## 2011-10-14 VITALS — BP 135/79 | HR 74 | Temp 97.9°F | Ht 71.0 in | Wt 192.0 lb

## 2011-10-14 DIAGNOSIS — I10 Essential (primary) hypertension: Secondary | ICD-10-CM

## 2011-10-14 DIAGNOSIS — F17200 Nicotine dependence, unspecified, uncomplicated: Secondary | ICD-10-CM

## 2011-10-14 DIAGNOSIS — B0221 Postherpetic geniculate ganglionitis: Secondary | ICD-10-CM

## 2011-10-14 DIAGNOSIS — B028 Zoster with other complications: Secondary | ICD-10-CM

## 2011-10-14 DIAGNOSIS — E785 Hyperlipidemia, unspecified: Secondary | ICD-10-CM

## 2011-10-14 DIAGNOSIS — B20 Human immunodeficiency virus [HIV] disease: Secondary | ICD-10-CM

## 2011-10-14 DIAGNOSIS — E7800 Pure hypercholesterolemia, unspecified: Secondary | ICD-10-CM

## 2011-10-14 DIAGNOSIS — B181 Chronic viral hepatitis B without delta-agent: Secondary | ICD-10-CM

## 2011-10-14 MED ORDER — LISINOPRIL-HYDROCHLOROTHIAZIDE 20-25 MG PO TABS: 1.0000 | ORAL_TABLET | Freq: Every day | ORAL | Status: DC

## 2011-10-14 MED ORDER — VALACYCLOVIR HCL 500 MG PO TABS: 500.0000 mg | ORAL_TABLET | Freq: Every day | ORAL | Status: AC

## 2011-10-14 MED ORDER — AMLODIPINE BESYLATE 5 MG PO TABS: 5.0000 mg | ORAL_TABLET | Freq: Every day | ORAL | Status: DC

## 2011-10-14 MED ORDER — VARENICLINE TARTRATE 0.5 MG X 11 & 1 MG X 42 PO MISC: ORAL | Status: AC

## 2011-10-14 MED ORDER — VARENICLINE TARTRATE 1 MG PO TABS: 1.0000 mg | ORAL_TABLET | Freq: Two times a day (BID) | ORAL | Status: AC

## 2011-10-14 MED ORDER — ATORVASTATIN CALCIUM 10 MG PO TABS: 10.0000 mg | ORAL_TABLET | Freq: Every day | ORAL | Status: DC

## 2011-10-14 NOTE — Assessment & Plan Note (Signed)
He will try Chantix

## 2011-10-14 NOTE — Assessment & Plan Note (Signed)
On a statin. His lipids may improve when changed to Atripla

## 2011-10-14 NOTE — Assessment & Plan Note (Signed)
A should would complete his Reyataz Norvir and Truvada and then switch over to Atripla in July

## 2011-10-14 NOTE — Assessment & Plan Note (Signed)
Patient will remain on Truvada as part of his antiretroviral regimen

## 2011-10-14 NOTE — Progress Notes (Signed)
  Subjective:    Patient ID: Dale Mcdonald, male    DOB: 21-Nov-1950, 61 y.o.   MRN: [id number]  HPI  [name] is a 61 y.o. male who is doing superbly well on his antiviral regimen, norvir reyataz and truvada with undetectable viral load and health cd4 count. He does suffer some icterus at times. He does continue to smoke today asked for prescriptions for Chantix to help him stop smoking. He also asked for refills on his antihypertensive medication and his statin. He is going to start up Atripla in July and will followup via the AIDS clinical trials group in September for labs and for followup visit with me in September as well.  Review of Systems  Constitutional: Negative for fever, chills, diaphoresis, activity change, appetite change, fatigue and unexpected weight change.  HENT: Negative for congestion, sore throat, rhinorrhea, sneezing, trouble swallowing and sinus pressure.   Eyes: Negative for photophobia and visual disturbance.  Respiratory: Negative for cough, chest tightness, shortness of breath, wheezing and stridor.   Cardiovascular: Negative for chest pain, palpitations and leg swelling.  Gastrointestinal: Negative for nausea, vomiting, abdominal pain, diarrhea, constipation, blood in stool, abdominal distention and anal bleeding.  Genitourinary: Negative for dysuria, hematuria, flank pain and difficulty urinating.  Musculoskeletal: Negative for myalgias, back pain, joint swelling, arthralgias and gait problem.  Skin: Negative for color change, pallor, rash and wound.  Neurological: Negative for dizziness, tremors, weakness and light-headedness.  Hematological: Negative for adenopathy. Does not bruise/bleed easily.  Psychiatric/Behavioral: Negative for behavioral problems, confusion, sleep disturbance, dysphoric mood, decreased concentration and agitation.       Objective:   Physical Exam  Constitutional: He is oriented to person, place, and time. He appears well-developed  and well-nourished. No distress.  HENT:  Head: Normocephalic and atraumatic.  Mouth/Throat: Oropharynx is clear and moist. No oropharyngeal exudate.  Eyes: Conjunctivae and EOM are normal. Pupils are equal, round, and reactive to light. No scleral icterus.  Neck: Normal range of motion. Neck supple. No JVD present.  Cardiovascular: Normal rate, regular rhythm and normal heart sounds.  Exam reveals no gallop and no friction rub.   No murmur heard. Pulmonary/Chest: Effort normal and breath sounds normal. No respiratory distress. He has no wheezes. He has no rales. He exhibits no tenderness.  Abdominal: He exhibits no distension and no mass. There is no tenderness. There is no rebound and no guarding.  Musculoskeletal: He exhibits no edema and no tenderness.  Lymphadenopathy:    He has no cervical adenopathy.  Neurological: He is alert and oriented to person, place, and time. He has normal reflexes. He exhibits normal muscle tone. Coordination normal.  Skin: Skin is warm and dry. He is not diaphoretic. No erythema. No pallor.  Psychiatric: He has a normal mood and affect. His behavior is normal. Judgment and thought content normal.          Assessment & Plan:  HIV INFECTION A should would complete his Reyataz Norvir and Truvada and then switch over to Atripla in July  HEPATITIS B, CHRONIC Patient will remain on Truvada as part of his antiretroviral regimen  HYPERLIPIDEMIA On a statin. His lipids may improve when changed to Atripla  SMOKER He will try Chantix

## 2011-12-28 ENCOUNTER — Other Ambulatory Visit: Payer: Self-pay | Admitting: Licensed Clinical Social Worker

## 2011-12-28 NOTE — Telephone Encounter (Signed)
He can have it PRN for thrush but he really shouldn't get thrush if his cd4s are up the way they have been

## 2011-12-28 NOTE — Telephone Encounter (Signed)
Patient states he is going to [place] on Wednesday and would like a rx for fluconazole to take with him just in case. He is not having any current symptoms of thrush. I made sure he understood that this was for yeast only. Please advise

## 2011-12-29 ENCOUNTER — Other Ambulatory Visit: Payer: Self-pay | Admitting: Licensed Clinical Social Worker

## 2011-12-29 DIAGNOSIS — B20 Human immunodeficiency virus [HIV] disease: Secondary | ICD-10-CM

## 2011-12-29 MED ORDER — FLUCONAZOLE 100 MG PO TABS: 100.0000 mg | ORAL_TABLET | Freq: Every day | ORAL | Status: DC

## 2011-12-29 NOTE — Telephone Encounter (Signed)
Spoke with the patient and he states that his wife has thrush and she really wants him to take the medications for prevention when he goes to [place]. I explained to him that his counts are high enough that he should not get thrush. I will call the rx to his pharmacy.

## 2011-12-30 ENCOUNTER — Other Ambulatory Visit: Payer: Self-pay | Admitting: *Deleted

## 2011-12-30 DIAGNOSIS — B20 Human immunodeficiency virus [HIV] disease: Secondary | ICD-10-CM

## 2011-12-30 MED ORDER — FLUCONAZOLE 100 MG PO TABS: 100.0000 mg | ORAL_TABLET | Freq: Every day | ORAL | Status: AC

## 2012-01-08 ENCOUNTER — Ambulatory Visit (INDEPENDENT_AMBULATORY_CARE_PROVIDER_SITE_OTHER): Payer: BC Managed Care – PPO | Admitting: *Deleted

## 2012-01-08 VITALS — BP 143/85 | HR 58 | Temp 98.5°F | Resp 18 | Wt 192.5 lb

## 2012-01-08 DIAGNOSIS — B20 Human immunodeficiency virus [HIV] disease: Secondary | ICD-10-CM

## 2012-01-08 LAB — HIV-1 RNA QUANT-NO REFLEX-BLD: HIV-1 RNA Viral Load: <40

## 2012-01-08 LAB — COMPREHENSIVE METABOLIC PANEL
ALT: 37 U/L (ref 0–53)
Albumin: 4.6 g/dL (ref 3.5–5.2)
CO2: 28 mEq/L (ref 19–32)
Glucose, Bld: 99 mg/dL (ref 70–99)
Potassium: 3.6 mEq/L (ref 3.5–5.3)
Sodium: 138 mEq/L (ref 135–145)
Total Protein: 7.6 g/dL (ref 6.0–8.3)

## 2012-01-08 LAB — CD4/CD8 (T-HELPER/T-SUPPRESSOR CELL): CD4%: 37.9

## 2012-01-08 LAB — CREATININE, URINE, RANDOM: Creatinine, Urine: 89.6 mg/dL

## 2012-01-08 LAB — LIPID PANEL
Cholesterol: 178 mg/dL (ref 0–200)
LDL Cholesterol: 114 mg/dL — ABNORMAL HIGH (ref 0–99)
Triglycerides: 106 mg/dL (ref <150)

## 2012-01-08 NOTE — Progress Notes (Signed)
Pt here for A5001, week 160 final visit. Pt has stopped prior ART regimen and started taking Atripla in 11/2011. Pt states he has not had any problems and denies any rashes. Otherwise assessment is unchanged since last study visit. Fasting labs were drawn. Pt received $20.00 gift card for study visit. Pt is eligible for A5322. Discussed briefly what the study was about and that it will not cover CD4 and HIV VL costs. We will be contacting him in October to see if he would like to screen for 5322. Tacey Heap RN

## 2012-01-27 ENCOUNTER — Other Ambulatory Visit: Payer: Self-pay | Admitting: Infectious Disease

## 2012-02-10 ENCOUNTER — Encounter: Payer: Self-pay | Admitting: Infectious Disease

## 2012-02-10 ENCOUNTER — Ambulatory Visit (INDEPENDENT_AMBULATORY_CARE_PROVIDER_SITE_OTHER): Payer: BC Managed Care – PPO | Admitting: Infectious Disease

## 2012-02-10 VITALS — BP 145/86 | HR 77 | Temp 97.5°F | Wt 192.0 lb

## 2012-02-10 DIAGNOSIS — I10 Essential (primary) hypertension: Secondary | ICD-10-CM

## 2012-02-10 DIAGNOSIS — Z23 Encounter for immunization: Secondary | ICD-10-CM

## 2012-02-10 DIAGNOSIS — F17200 Nicotine dependence, unspecified, uncomplicated: Secondary | ICD-10-CM

## 2012-02-10 DIAGNOSIS — E785 Hyperlipidemia, unspecified: Secondary | ICD-10-CM

## 2012-02-10 DIAGNOSIS — B20 Human immunodeficiency virus [HIV] disease: Secondary | ICD-10-CM

## 2012-02-10 DIAGNOSIS — B181 Chronic viral hepatitis B without delta-agent: Secondary | ICD-10-CM

## 2012-02-10 MED ORDER — ATORVASTATIN CALCIUM 20 MG PO TABS: 20.0000 mg | ORAL_TABLET | Freq: Every day | ORAL | Status: DC

## 2012-02-10 NOTE — Assessment & Plan Note (Signed)
On Atripla.

## 2012-02-10 NOTE — Assessment & Plan Note (Signed)
See above discussion. I will change him to Lipitor 20 mg.

## 2012-02-10 NOTE — Progress Notes (Signed)
Subjective:    Patient ID: Dale Mcdonald, male    DOB: [birth year], 61 y.o.   MRN: [id number]  HPI  61 y.o. male who is doing superbly well on his antiviral regimen of Atripla to which he had been changed from his prior regimen of Reyataz Norvir Truvada which he had received through AIDS clinical trials group 5257. He is doing very well and is just into the Atripla. His not having vivid dreams. His LDL did come up likely due to 2 fracture. 1 the Norvir the largest in his system which could have been elevating the levels of Lipitor. Secondly the fibers that he is now getting through Atripla may be diminishing the levels of Lipitor a system. Therefore I am increasing his Lipitor 20 mg daily. He is trying to stop smoking.   Review of Systems  Constitutional: Negative for fever, chills, diaphoresis, activity change, appetite change, fatigue and unexpected weight change.  HENT: Negative for congestion, sore throat, rhinorrhea, sneezing, trouble swallowing and sinus pressure.   Eyes: Negative for photophobia and visual disturbance.  Respiratory: Negative for cough, chest tightness, shortness of breath, wheezing and stridor.   Cardiovascular: Negative for chest pain, palpitations and leg swelling.  Gastrointestinal: Negative for nausea, vomiting, abdominal pain, diarrhea, constipation, blood in stool, abdominal distention and anal bleeding.  Genitourinary: Negative for dysuria, hematuria, flank pain and difficulty urinating.  Musculoskeletal: Negative for myalgias, back pain, joint swelling, arthralgias and gait problem.  Skin: Negative for color change, pallor, rash and wound.  Neurological: Negative for dizziness, tremors, weakness and light-headedness.  Hematological: Negative for adenopathy. Does not bruise/bleed easily.  Psychiatric/Behavioral: Negative for behavioral problems, confusion, disturbed wake/sleep cycle, dysphoric mood, decreased concentration and agitation.       Objective:   Physical Exam  Constitutional: He is oriented to person, place, and time. He appears well-developed and well-nourished. No distress.  HENT:  Head: Normocephalic and atraumatic.  Mouth/Throat: Oropharynx is clear and moist. No oropharyngeal exudate.  Eyes: Conjunctivae normal and EOM are normal. Pupils are equal, round, and reactive to light. No scleral icterus.  Neck: Normal range of motion. Neck supple. No JVD present.  Cardiovascular: Normal rate, regular rhythm and normal heart sounds.  Exam reveals no gallop and no friction rub.   No murmur heard. Pulmonary/Chest: Effort normal and breath sounds normal. No respiratory distress. He has no wheezes. He has no rales. He exhibits no tenderness.  Abdominal: He exhibits no distension and no mass. There is no tenderness. There is no rebound and no guarding.  Musculoskeletal: He exhibits no edema and no tenderness.  Lymphadenopathy:    He has no cervical adenopathy.  Neurological: He is alert and oriented to person, place, and time. He has normal reflexes. He exhibits normal muscle tone. Coordination normal.  Skin: Skin is warm and dry. He is not diaphoretic. No erythema. No pallor.  Psychiatric: He has a normal mood and affect. His behavior is normal. Judgment and thought content normal.          Assessment & Plan:  HIV INFECTION Continue Atripla. We'll continue to purchase stated AIDS clinical trials group research. We'll recheck his viral load and CD4 count in 6 months time along with RPR and gonorrhea and Chlamydia. A light his primary care physician check a CBC metabolic panel lipid panel periodically.  HEPATITIS B, CHRONIC On Atripla.  HYPERTENSION On lisinopril hydrochlorothiazide Norvasc followed by Dr. Gwendolyn Fill Encouraged to quit smoking.  HYPERLIPIDEMIA See above discussion. I will change him  to Lipitor 20 mg.

## 2012-02-10 NOTE — Assessment & Plan Note (Signed)
On lisinopril hydrochlorothiazide Norvasc followed by Dr. Alphonsus Sias

## 2012-02-10 NOTE — Assessment & Plan Note (Signed)
Continue Atripla. We'll continue to purchase stated AIDS clinical trials group research. We'll recheck his viral load and CD4 count in 6 months time along with RPR and gonorrhea and Chlamydia. A light his primary care physician check a CBC metabolic panel lipid panel periodically.

## 2012-02-10 NOTE — Assessment & Plan Note (Signed)
Encouraged to quit smoking.  

## 2012-02-22 ENCOUNTER — Ambulatory Visit (INDEPENDENT_AMBULATORY_CARE_PROVIDER_SITE_OTHER): Payer: BC Managed Care – PPO | Admitting: Internal Medicine

## 2012-02-22 ENCOUNTER — Encounter: Payer: Self-pay | Admitting: Internal Medicine

## 2012-02-22 VITALS — BP 120/70 | HR 55 | Temp 98.5°F

## 2012-02-22 DIAGNOSIS — R0789 Other chest pain: Secondary | ICD-10-CM | POA: Insufficient documentation

## 2012-02-22 MED ORDER — ALPRAZOLAM 0.25 MG PO TABS: 0.2500 mg | ORAL_TABLET | Freq: Two times a day (BID) | ORAL | Status: DC | PRN

## 2012-02-22 NOTE — Patient Instructions (Signed)
Please call if you seem to have frequent stress or anxiety symptoms. We will consider starting lexapro if this is the case

## 2012-02-22 NOTE — Progress Notes (Signed)
Subjective:    Patient ID: Dale Mcdonald, male    DOB: 04/12/1951, 61 y.o.   MRN: [id number]  HPI Here with wife  May have started with a little tension in chest 3 days ago---wife had mammo which had some suspicions Started with some tightness in chest 2 days ago Dale Mcdonald to football game, had pizza---then got terrible headache (this went away with 1/2 vicodin and decongestant) Went home, then felt "like a 50# weight on my chest" So much pressure he couldn't lie on stomach Felt some numbness down left arm---"odd" feeling Relates this to stress  Still has some tightness in chest now Tried a alprazolam this AM--helped a little No change with exertion No relationship to eating but appetite is off some  Current Outpatient Prescriptions on File Prior to Visit  Medication Sig Dispense Refill  . amLODipine (NORVASC) 5 MG tablet Take 1 tablet (5 mg total) by mouth daily.  30 tablet  11  . atorvastatin (LIPITOR) 20 MG tablet Take 1 tablet (20 mg total) by mouth daily.  30 tablet  11  . efavirenz-emtrictabine-tenofovir (ATRIPLA) 600-200-300 MG per tablet Take 1 tablet by mouth at bedtime.  30 tablet  11  . lisinopril-hydrochlorothiazide (PRINZIDE,ZESTORETIC) 20-25 MG per tablet Take 1 tablet by mouth daily.  90 tablet  1  . ondansetron (ZOFRAN) 4 MG tablet Take 4 mg by mouth 4 (four) times daily as needed.       . valACYclovir (VALTREX) 500 MG tablet Take 1 tablet (500 mg total) by mouth daily.  30 tablet  11  . zolpidem (AMBIEN) 10 MG tablet Take 1 tablet (10 mg total) by mouth at bedtime as needed.  30 tablet  4    No Known Allergies  No past medical history on file.  No past surgical history on file.  No family history on file.  History   Social History  . Marital Status: Married    Spouse Name: N/A    Number of Children: N/A  . Years of Education: N/A   Occupational History  . Not on file.   Social History Main Topics  . Smoking status: Current Every Day Smoker -- 1.5  packs/day    Types: Cigarettes  . Smokeless tobacco: Never Used   Comment: gave 1-800-QUIT-NOW  . Alcohol Use: No  . Drug Use: No  . Sexually Active: Not on file   Other Topics Concern  . Not on file   Social History Narrative  . No narrative on file   Review of Systems No regular indigestion problems No nausea Had sweaty feeling last night in bed No SOB--just "catches" with the tight feeling Wife having sig med issues--fall with TBI?, autoimmune disorder? Stress at work Doesn't feel depressed He doesn't feel ongoing anxiety but wife feels he may have some---certainly has stress    Objective:   Physical Exam  Constitutional: He appears well-developed and well-nourished. No distress.  Neck: Normal range of motion. Neck supple. No thyromegaly present.  Cardiovascular: Normal rate, regular rhythm, normal heart sounds and intact distal pulses.  Exam reveals no gallop.   No murmur heard. Pulmonary/Chest: Effort normal and breath sounds normal. No respiratory distress. He has no wheezes. He has no rales. He exhibits no tenderness.  Abdominal: Soft. There is no tenderness.  Musculoskeletal: He exhibits no edema and no tenderness.  Lymphadenopathy:    He has no cervical adenopathy.  Psychiatric: He has a normal mood and affect. His behavior is normal. Thought content normal.  Doesn't really appear depressed or anxious          Assessment & Plan:

## 2012-02-22 NOTE — Assessment & Plan Note (Addendum)
EKG shows possible junctional rhythm but no ischemic changes No dizziness, etc that suggest rhythm based problems He feels the symptoms are stress related and this is likely Alprazolam some help Discussed option of restarting SSRI---he was on citalopram in past (and wife has lots of leftover escitalopram)  For now, will continue alprazolam only as prn If he is having ongoing symptoms, would consider restarting SSRI  Could consider cardiology eval if more specific heart symptoms

## 2012-02-26 ENCOUNTER — Other Ambulatory Visit: Payer: Self-pay | Admitting: Infectious Disease

## 2012-02-29 ENCOUNTER — Other Ambulatory Visit: Payer: Self-pay | Admitting: Licensed Clinical Social Worker

## 2012-02-29 DIAGNOSIS — B20 Human immunodeficiency virus [HIV] disease: Secondary | ICD-10-CM

## 2012-02-29 MED ORDER — EFAVIRENZ-EMTRICITAB-TENOFOVIR 600-200-300 MG PO TABS: 1.0000 | ORAL_TABLET | Freq: Every day | ORAL | Status: DC

## 2012-03-04 ENCOUNTER — Other Ambulatory Visit: Payer: Self-pay | Admitting: Infectious Disease

## 2012-03-24 ENCOUNTER — Encounter: Payer: Self-pay | Admitting: Infectious Disease

## 2012-04-26 ENCOUNTER — Other Ambulatory Visit: Payer: Self-pay | Admitting: Licensed Clinical Social Worker

## 2012-04-26 DIAGNOSIS — E785 Hyperlipidemia, unspecified: Secondary | ICD-10-CM

## 2012-04-27 MED ORDER — ATORVASTATIN CALCIUM 20 MG PO TABS: 20.0000 mg | ORAL_TABLET | Freq: Every day | ORAL | Status: DC

## 2012-05-02 ENCOUNTER — Other Ambulatory Visit: Payer: Self-pay | Admitting: *Deleted

## 2012-05-02 DIAGNOSIS — I10 Essential (primary) hypertension: Secondary | ICD-10-CM

## 2012-05-02 MED ORDER — LISINOPRIL-HYDROCHLOROTHIAZIDE 20-25 MG PO TABS: 1.0000 | ORAL_TABLET | Freq: Every day | ORAL | Status: DC

## 2012-06-26 ENCOUNTER — Other Ambulatory Visit: Payer: Self-pay | Admitting: Infectious Disease

## 2012-06-28 ENCOUNTER — Other Ambulatory Visit: Payer: Self-pay | Admitting: Internal Medicine

## 2012-07-10 ENCOUNTER — Encounter: Payer: Self-pay | Admitting: Infectious Disease

## 2012-07-11 ENCOUNTER — Encounter: Payer: Self-pay | Admitting: Infectious Disease

## 2012-07-18 ENCOUNTER — Other Ambulatory Visit: Payer: Self-pay | Admitting: Infectious Disease

## 2012-07-18 ENCOUNTER — Other Ambulatory Visit: Payer: BC Managed Care – PPO

## 2012-07-18 ENCOUNTER — Ambulatory Visit (INDEPENDENT_AMBULATORY_CARE_PROVIDER_SITE_OTHER): Payer: BC Managed Care – PPO | Admitting: *Deleted

## 2012-07-18 VITALS — BP 148/82 | HR 65 | Temp 98.4°F | Ht 70.5 in | Wt 192.5 lb

## 2012-07-18 DIAGNOSIS — Z21 Asymptomatic human immunodeficiency virus [HIV] infection status: Secondary | ICD-10-CM

## 2012-07-18 DIAGNOSIS — B20 Human immunodeficiency virus [HIV] disease: Secondary | ICD-10-CM

## 2012-07-18 LAB — CBC WITH DIFFERENTIAL/PLATELET
Basophils Absolute: 0 10*3/uL (ref 0.0–0.1)
Basophils Relative: 1 % (ref 0–1)
Eosinophils Absolute: 0.2 10*3/uL (ref 0.0–0.7)
HCT: 44.3 % (ref 39.0–52.0)
MCHC: 35.4 g/dL (ref 30.0–36.0)
Monocytes Absolute: 0.6 10*3/uL (ref 0.1–1.0)
Neutro Abs: 4.1 10*3/uL (ref 1.7–7.7)
RDW: 13.9 % (ref 11.5–15.5)

## 2012-07-18 LAB — HEMOGLOBIN A1C: Mean Plasma Glucose: 126 mg/dL — ABNORMAL HIGH (ref <117)

## 2012-07-18 LAB — COMPREHENSIVE METABOLIC PANEL
ALT: 43 U/L (ref 0–53)
AST: 34 U/L (ref 0–37)
Creat: 0.82 mg/dL (ref 0.50–1.35)
Total Bilirubin: 0.4 mg/dL (ref 0.3–1.2)

## 2012-07-18 LAB — PROTEIN, URINE, RANDOM: Total Protein, Urine: 5 mg/dL

## 2012-07-18 LAB — LIPID PANEL
Cholesterol: 157 mg/dL (ref 0–200)
Total CHOL/HDL Ratio: 3.5 Ratio
VLDL: 24 mg/dL (ref 0–40)

## 2012-07-18 LAB — CREATININE, URINE, RANDOM: Creatinine, Urine: 46.2 mg/dL

## 2012-07-18 NOTE — Progress Notes (Signed)
Patient here today to enroll in the [facility] study. Informed consent was obtained after reviewing the consent. He denies any new problems or concerns except for stress. He is to see Dr. Alphonsus Sias soon.

## 2012-07-19 LAB — HIV-1 RNA QUANT-NO REFLEX-BLD: HIV 1 RNA Quant: <20 copies/mL (ref <20)

## 2012-07-20 LAB — T-HELPER CELL (CD4) - (RCID CLINIC ONLY)
CD4 % Helper T Cell: 40 % (ref 33–55)
CD4 T Cell Abs: 850 uL (ref 400–2700)

## 2012-07-25 ENCOUNTER — Ambulatory Visit (INDEPENDENT_AMBULATORY_CARE_PROVIDER_SITE_OTHER): Payer: 59 | Admitting: Internal Medicine

## 2012-07-25 ENCOUNTER — Encounter: Payer: Self-pay | Admitting: Internal Medicine

## 2012-07-25 VITALS — BP 132/68 | HR 86 | Temp 98.1°F | Wt 190.0 lb

## 2012-07-25 DIAGNOSIS — F419 Anxiety disorder, unspecified: Secondary | ICD-10-CM

## 2012-07-25 DIAGNOSIS — I10 Essential (primary) hypertension: Secondary | ICD-10-CM

## 2012-07-25 DIAGNOSIS — Z23 Encounter for immunization: Secondary | ICD-10-CM

## 2012-07-25 DIAGNOSIS — Z0000 Encounter for general adult medical examination without abnormal findings: Secondary | ICD-10-CM

## 2012-07-25 DIAGNOSIS — F411 Generalized anxiety disorder: Secondary | ICD-10-CM

## 2012-07-25 DIAGNOSIS — E785 Hyperlipidemia, unspecified: Secondary | ICD-10-CM

## 2012-07-25 DIAGNOSIS — F439 Reaction to severe stress, unspecified: Secondary | ICD-10-CM | POA: Insufficient documentation

## 2012-07-25 DIAGNOSIS — B20 Human immunodeficiency virus [HIV] disease: Secondary | ICD-10-CM

## 2012-07-25 MED ORDER — ALPRAZOLAM 0.25 MG PO TABS: 0.2500 mg | ORAL_TABLET | Freq: Two times a day (BID) | ORAL | Status: DC | PRN

## 2012-07-25 NOTE — Assessment & Plan Note (Signed)
Needs to work on exercise Discussed PSA--- will not check Otherwise fine

## 2012-07-25 NOTE — Assessment & Plan Note (Signed)
BP Readings from Last 3 Encounters:  07/25/12 132/68  07/18/12 148/82  02/22/12 120/70   Good control Recent labs No changes needed

## 2012-07-25 NOTE — Assessment & Plan Note (Signed)
Undetectable levels on Rx Dr Algis Liming manages

## 2012-07-25 NOTE — Patient Instructions (Signed)
Please check with the pharmacy about the zostavax (shingles vaccine). I can send a prescription if it is covered.  Please start doing regular exercise---at least every day you have off

## 2012-07-25 NOTE — Addendum Note (Signed)
Addended by: Sueanne Margarita on: 07/25/2012 12:36 PM   Modules accepted: Orders

## 2012-07-25 NOTE — Assessment & Plan Note (Signed)
Discussed exercise Xanax for rare prn use

## 2012-07-25 NOTE — Assessment & Plan Note (Signed)
Lab Results  Component Value Date   LDLCALC 88 07/18/2012   Doing well on the med

## 2012-07-25 NOTE — Progress Notes (Signed)
Subjective:    Patient ID: Dale Mcdonald, male    DOB: 1950-10-18, 62 y.o.   MRN: [id number]  HPI Here for physical Continues with Dr Myrtha Mantis titers have been negative (none detected) Just joined another trial--related to HIV in the elderly  Due for Tdap Dr Algis Liming has recommended zostavax---he will check at pharmacy (to determine cost) Discussed PSA---he is okay with not checking Had colonscopy--2008 (hyperplastic polyp)  Still gets some anxiety at times Ongoing stress with wife's condition---she may have some autoimmune disorder that still is undiagnosed Still smoking-- not ready (chantix not effective)  Current Outpatient Prescriptions on File Prior to Visit  Medication Sig Dispense Refill  . ALPRAZolam (XANAX) 0.25 MG tablet Take 1 tablet (0.25 mg total) by mouth 2 (two) times daily as needed for anxiety.  60 tablet  0  . amLODipine (NORVASC) 5 MG tablet TAKE 1 TABLET BY MOUTH EVERY DAY  30 tablet  8  . atorvastatin (LIPITOR) 20 MG tablet Take 1 tablet (20 mg total) by mouth daily.  90 tablet  4  . efavirenz-emtricitabine-tenofovir (ATRIPLA) 600-200-300 MG per tablet Take 1 tablet by mouth at bedtime.  30 tablet  11  . lisinopril-hydrochlorothiazide (PRINZIDE,ZESTORETIC) 20-25 MG per tablet TAKE ONE TABLET BY MOUTH EVERY DAY  90 tablet  2  . ondansetron (ZOFRAN) 4 MG tablet Take 4 mg by mouth 4 (four) times daily as needed.       . valACYclovir (VALTREX) 500 MG tablet Take 1 tablet (500 mg total) by mouth daily.  30 tablet  11  . zolpidem (AMBIEN) 10 MG tablet Take 1 tablet (10 mg total) by mouth at bedtime as needed.  30 tablet  4   No current facility-administered medications on file prior to visit.    No Known Allergies  Past Medical History  Diagnosis Date  . Anxiety     No past surgical history on file.  No family history on file.  History   Social History  . Marital Status: Married    Spouse Name: N/A    Number of Children: N/A  . Years of Education:  N/A   Occupational History  . Not on file.   Social History Main Topics  . Smoking status: Current Every Day Smoker -- 1.50 packs/day    Types: Cigarettes  . Smokeless tobacco: Never Used     Comment: gave 1-800-QUIT-NOW  . Alcohol Use: No  . Drug Use: No  . Sexually Active: Not on file   Other Topics Concern  . Not on file   Social History Narrative  . No narrative on file     Review of Systems  Constitutional: Negative for fatigue and unexpected weight change.       Wears seat belt  HENT: Positive for congestion, rhinorrhea and dental problem. Negative for hearing loss and tinnitus.        Recent root canal--keeps up  Eyes: Negative for visual disturbance.       No diplopia or unilateral vision loss  Respiratory: Negative for cough, chest tightness and shortness of breath.   Cardiovascular: Negative for chest pain, palpitations and leg swelling.  Gastrointestinal: Positive for abdominal pain and diarrhea. Negative for nausea, vomiting and blood in stool.       Some IBS type symptoms-chocolate causes problems  Endocrine: Positive for cold intolerance. Negative for heat intolerance.       Feet stay cold  Genitourinary: Negative for urgency, frequency and difficulty urinating.       No  sexual problems  Musculoskeletal: Negative for joint swelling, arthralgias and gait problem.  Skin: Negative for rash.       No suspicious lesions  Allergic/Immunologic: Positive for environmental allergies.       Tries to avoid meds  Neurological: Negative for dizziness, syncope, weakness, light-headedness, numbness and headaches.  Hematological: Negative for adenopathy. Does not bruise/bleed easily.  Psychiatric/Behavioral: Positive for sleep disturbance and dysphoric mood. The patient is nervous/anxious.        Occ sleep problems--uses the zolpidem rarely Frustrated and depressed at times over wife's problems Nerves act up once in a while       Objective:   Physical Exam   Constitutional: He is oriented to person, place, and time. He appears well-developed and well-nourished. No distress.  HENT:  Head: Normocephalic and atraumatic.  Right Ear: External ear normal.  Left Ear: External ear normal.  Mouth/Throat: Oropharynx is clear and moist. No oropharyngeal exudate.  Eyes: Conjunctivae and EOM are normal. Pupils are equal, round, and reactive to light.  Neck: Normal range of motion. Neck supple. No thyromegaly present.  Cardiovascular: Normal rate, regular rhythm, normal heart sounds and intact distal pulses.  Exam reveals no gallop.   No murmur heard. Pulmonary/Chest: Effort normal and breath sounds normal. No respiratory distress. He has no wheezes. He has no rales.  Abdominal: Soft. There is no tenderness.  Musculoskeletal: He exhibits no edema and no tenderness.  Lymphadenopathy:    He has no cervical adenopathy.  Neurological: He is alert and oriented to person, place, and time.  Skin: No rash noted. No erythema.  Psychiatric: He has a normal mood and affect. His behavior is normal.          Assessment & Plan:

## 2012-08-04 ENCOUNTER — Encounter: Payer: Self-pay | Admitting: Internal Medicine

## 2012-08-04 MED ORDER — ZOSTER VACCINE LIVE 19400 UNT/0.65ML ~~LOC~~ SOLR: 0.6500 mL | Freq: Once | SUBCUTANEOUS | Status: DC

## 2012-08-04 NOTE — Telephone Encounter (Signed)
[facility] said they are not giving Dale Mcdonald yet and they are discontinuing the electronic rx they received. Request send rx to [facility] or [facility].Please advise.

## 2012-08-09 ENCOUNTER — Encounter: Payer: Self-pay | Admitting: Internal Medicine

## 2012-09-18 ENCOUNTER — Other Ambulatory Visit: Payer: Self-pay | Admitting: Internal Medicine

## 2012-09-19 NOTE — Telephone Encounter (Signed)
rx called into pharmacy

## 2012-09-19 NOTE — Telephone Encounter (Signed)
Okay #[phone] x 0 

## 2012-09-28 ENCOUNTER — Ambulatory Visit: Payer: 59 | Admitting: Infectious Disease

## 2012-10-12 ENCOUNTER — Encounter: Payer: Self-pay | Admitting: Infectious Disease

## 2012-10-12 ENCOUNTER — Ambulatory Visit (INDEPENDENT_AMBULATORY_CARE_PROVIDER_SITE_OTHER): Payer: 59 | Admitting: Infectious Disease

## 2012-10-12 ENCOUNTER — Other Ambulatory Visit: Payer: Self-pay | Admitting: *Deleted

## 2012-10-12 VITALS — BP 153/82 | HR 76 | Temp 97.8°F | Wt 190.0 lb

## 2012-10-12 DIAGNOSIS — B20 Human immunodeficiency virus [HIV] disease: Secondary | ICD-10-CM

## 2012-10-12 DIAGNOSIS — F4323 Adjustment disorder with mixed anxiety and depressed mood: Secondary | ICD-10-CM

## 2012-10-12 DIAGNOSIS — E785 Hyperlipidemia, unspecified: Secondary | ICD-10-CM

## 2012-10-12 DIAGNOSIS — G4700 Insomnia, unspecified: Secondary | ICD-10-CM

## 2012-10-12 DIAGNOSIS — F17200 Nicotine dependence, unspecified, uncomplicated: Secondary | ICD-10-CM

## 2012-10-12 MED ORDER — ZOLPIDEM TARTRATE 10 MG PO TABS: 10.0000 mg | ORAL_TABLET | Freq: Every evening | ORAL | Status: DC | PRN

## 2012-10-12 NOTE — Patient Instructions (Addendum)
The study we are talking (Switch study)  Is called STRIIVING (GSK)

## 2012-10-12 NOTE — Progress Notes (Signed)
Subjective:    Patient ID: Dale Mcdonald, male    DOB: May 15, 1951, 62 y.o.   MRN: [id number]  HPI  62 y.o. male who is doing superbly well on his antiviral regimen of Atripla to which he had been changed from his prior regimen of Reyataz Norvir Truvada which he had received through AIDS clinical trials group 5257.   He is doing very well and is just into the Atripla. His not having vivid dreams. His LDL did come up  We increased his Lipitor 20 mg daily. He has tried unsuccessfully trying to stop smoking. He is still under a considerable amount of duress related to his wife confusing medical condition which is apparently in an autoimmune condition. Blood pressure was up again in clinic but he assures me has been more well-controlled at home and that much of this is whitecoat hypertension.  His been enrolled in HALO study in the [facility]. I also described the STRIIVING study that he could also possibly enroll in if there is  No conflict to him being in an observational study and a pharma sponsored switch study. He would like to read about this and I have given him name of the study and will let Tania Ade know of his interest.  Review of Systems  Constitutional: Negative for fever, chills, diaphoresis, activity change, appetite change, fatigue and unexpected weight change.  HENT: Negative for congestion, sore throat, rhinorrhea, sneezing, trouble swallowing and sinus pressure.   Eyes: Negative for photophobia and visual disturbance.  Respiratory: Negative for cough, chest tightness, shortness of breath, wheezing and stridor.   Cardiovascular: Negative for chest pain, palpitations and leg swelling.  Gastrointestinal: Negative for nausea, vomiting, abdominal pain, diarrhea, constipation, blood in stool, abdominal distention and anal bleeding.  Genitourinary: Negative for dysuria, hematuria, flank pain and difficulty urinating.  Musculoskeletal: Negative for myalgias, back pain, joint swelling, arthralgias  and gait problem.  Skin: Negative for color change, pallor, rash and wound.  Neurological: Negative for dizziness, tremors, weakness and light-headedness.  Hematological: Negative for adenopathy. Does not bruise/bleed easily.  Psychiatric/Behavioral: Negative for behavioral problems, confusion, sleep disturbance, dysphoric mood, decreased concentration and agitation.       Objective:   Physical Exam  Constitutional: He is oriented to person, place, and time. He appears well-developed and well-nourished. No distress.  HENT:  Head: Normocephalic and atraumatic.  Mouth/Throat: Oropharynx is clear and moist. No oropharyngeal exudate.  Eyes: Conjunctivae and EOM are normal. Pupils are equal, round, and reactive to light. No scleral icterus.  Neck: Normal range of motion. Neck supple. No JVD present.  Cardiovascular: Normal rate, regular rhythm and normal heart sounds.  Exam reveals no gallop and no friction rub.   No murmur heard. Pulmonary/Chest: Effort normal and breath sounds normal. No respiratory distress. He has no wheezes. He has no rales. He exhibits no tenderness.  Abdominal: He exhibits no distension and no mass. There is no tenderness. There is no rebound and no guarding.  Musculoskeletal: He exhibits no edema and no tenderness.  Lymphadenopathy:    He has no cervical adenopathy.  Neurological: He is alert and oriented to person, place, and time. He has normal reflexes. He exhibits normal muscle tone. Coordination normal.  Skin: Skin is warm and dry. He is not diaphoretic. No erythema. No pallor.  Psychiatric: He has a normal mood and affect. His behavior is normal. Judgment and thought content normal.          Assessment & Plan:   HIV: continue Atripla,  consider STRIIVING  Hyperlipidemia continue statin.  Anxiety continue Xanax.  Insomnia updated continue Ambien although he could use Xanax and said of his Ambien.  Hypertension: On amlodipine likely has a component of  whitecoat hypertension as he claims that his blood pressure much better at home.  Smoking: having hard time quitting.

## 2012-10-22 ENCOUNTER — Other Ambulatory Visit: Payer: Self-pay | Admitting: Infectious Disease

## 2012-11-21 ENCOUNTER — Other Ambulatory Visit: Payer: Self-pay | Admitting: Infectious Disease

## 2012-11-25 ENCOUNTER — Other Ambulatory Visit: Payer: Self-pay | Admitting: Internal Medicine

## 2012-11-28 NOTE — Telephone Encounter (Signed)
rx called into pharmacy

## 2012-11-28 NOTE — Telephone Encounter (Signed)
Okay #[phone] x 0 

## 2013-01-05 ENCOUNTER — Other Ambulatory Visit: Payer: Self-pay | Admitting: Licensed Clinical Social Worker

## 2013-01-05 DIAGNOSIS — B20 Human immunodeficiency virus [HIV] disease: Secondary | ICD-10-CM

## 2013-01-05 MED ORDER — EFAVIRENZ-EMTRICITAB-TENOFOVIR 600-200-300 MG PO TABS: 1.0000 | ORAL_TABLET | Freq: Every day | ORAL | Status: DC

## 2013-01-18 ENCOUNTER — Ambulatory Visit: Payer: 59 | Admitting: *Deleted

## 2013-01-18 ENCOUNTER — Other Ambulatory Visit: Payer: 59

## 2013-01-18 VITALS — BP 153/83 | HR 66 | Temp 98.2°F | Ht 70.5 in | Wt 187.5 lb

## 2013-01-18 DIAGNOSIS — B20 Human immunodeficiency virus [HIV] disease: Secondary | ICD-10-CM

## 2013-01-18 DIAGNOSIS — Z21 Asymptomatic human immunodeficiency virus [HIV] infection status: Secondary | ICD-10-CM

## 2013-01-18 NOTE — Progress Notes (Signed)
Dale Mcdonald is here for A5322, week 24 study. He denies any new problems, or medication changes. Fasting labs were drawn. Questionnaires completed. He received $50 gift card for visit. Next visit scheduled for Tuesday, June 20, 2012 @ 8am. Tacey Heap RN

## 2013-01-19 ENCOUNTER — Other Ambulatory Visit: Payer: Self-pay | Admitting: Infectious Disease

## 2013-01-19 LAB — HIV-1 RNA QUANT-NO REFLEX-BLD: HIV 1 RNA Quant: <20 copies/mL (ref <20)

## 2013-01-25 ENCOUNTER — Encounter: Payer: Self-pay | Admitting: Infectious Disease

## 2013-03-03 ENCOUNTER — Other Ambulatory Visit: Payer: Self-pay | Admitting: *Deleted

## 2013-03-03 ENCOUNTER — Encounter: Payer: Self-pay | Admitting: Infectious Disease

## 2013-03-03 DIAGNOSIS — B20 Human immunodeficiency virus [HIV] disease: Secondary | ICD-10-CM

## 2013-03-03 MED ORDER — EFAVIRENZ-EMTRICITAB-TENOFOVIR 600-200-300 MG PO TABS: 1.0000 | ORAL_TABLET | Freq: Every day | ORAL | Status: DC

## 2013-03-06 ENCOUNTER — Other Ambulatory Visit: Payer: Self-pay | Admitting: Infectious Disease

## 2013-03-06 DIAGNOSIS — G4700 Insomnia, unspecified: Secondary | ICD-10-CM

## 2013-04-15 ENCOUNTER — Other Ambulatory Visit: Payer: Self-pay | Admitting: Internal Medicine

## 2013-04-17 NOTE — Telephone Encounter (Signed)
rx called into pharmacy

## 2013-04-17 NOTE — Telephone Encounter (Signed)
Last filled 11/25/12

## 2013-04-17 NOTE — Telephone Encounter (Signed)
Okay #[phone] x 0 

## 2013-04-19 ENCOUNTER — Encounter: Payer: Self-pay | Admitting: Infectious Disease

## 2013-04-19 ENCOUNTER — Ambulatory Visit (INDEPENDENT_AMBULATORY_CARE_PROVIDER_SITE_OTHER): Payer: Managed Care, Other (non HMO) | Admitting: Infectious Disease

## 2013-04-19 VITALS — BP 145/80 | HR 71 | Temp 98.1°F | Wt 190.2 lb

## 2013-04-19 DIAGNOSIS — I10 Essential (primary) hypertension: Secondary | ICD-10-CM

## 2013-04-19 DIAGNOSIS — F411 Generalized anxiety disorder: Secondary | ICD-10-CM

## 2013-04-19 DIAGNOSIS — G4700 Insomnia, unspecified: Secondary | ICD-10-CM

## 2013-04-19 DIAGNOSIS — B20 Human immunodeficiency virus [HIV] disease: Secondary | ICD-10-CM

## 2013-04-19 LAB — CBC WITH DIFFERENTIAL/PLATELET
Basophils Absolute: 0.1 10*3/uL (ref 0.0–0.1)
Basophils Relative: 1 % (ref 0–1)
Eosinophils Absolute: 0.2 10*3/uL (ref 0.0–0.7)
Eosinophils Relative: 2 % (ref 0–5)
MCH: 32.6 pg (ref 26.0–34.0)
MCV: 90.1 fL (ref 78.0–100.0)
Neutrophils Relative %: 60 % (ref 43–77)
Platelets: 226 10*3/uL (ref 150–400)
RDW: 13.2 % (ref 11.5–15.5)

## 2013-04-19 LAB — T-HELPER CELL (CD4) - (RCID CLINIC ONLY): CD4 % Helper T Cell: 39 % (ref 33–55)

## 2013-04-19 LAB — RPR

## 2013-04-19 LAB — LIPID PANEL
Cholesterol: 152 mg/dL (ref 0–200)
HDL: 47 mg/dL (ref >39)

## 2013-04-19 LAB — COMPLETE METABOLIC PANEL WITH GFR
ALT: 27 U/L (ref 0–53)
AST: 24 U/L (ref 0–37)
Albumin: 4.6 g/dL (ref 3.5–5.2)
BUN: 7 mg/dL (ref 6–23)
CO2: 27 mEq/L (ref 19–32)
Calcium: 9.7 mg/dL (ref 8.4–10.5)
Chloride: 100 mEq/L (ref 96–112)
Creat: 0.84 mg/dL (ref 0.50–1.35)
GFR, Est African American: >89 mL/min
Potassium: 3.7 mEq/L (ref 3.5–5.3)

## 2013-04-19 NOTE — Progress Notes (Signed)
  Subjective:    Patient ID: Dale Mcdonald, male    DOB: November 08, 1950, 62 y.o.   MRN: [id number]  HPI   62 y.o. male who is doing superbly well on his antiviral regimen of Atripla to which he had been changed from his prior regimen of Reyataz Norvir Truvada which he had received through AIDS clinical trials group 5257.   He is doing very well and is just into the Atripla. His not having vivid dreams. His LDL did come up  We increased his Lipitor 20 mg daily. He has tried unsuccessfully trying to stop smoking. He is still under a considerable amount of duress related to his wife confusing medical condition which is apparently in an autoimmune condition  His been enrolled in HALO study in the [facility].    Review of Systems  Constitutional: Negative for fever, chills, diaphoresis, activity change, appetite change, fatigue and unexpected weight change.  HENT: Negative for congestion, rhinorrhea, sinus pressure, sneezing, sore throat and trouble swallowing.   Eyes: Negative for photophobia and visual disturbance.  Respiratory: Negative for cough, chest tightness, shortness of breath, wheezing and stridor.   Cardiovascular: Negative for chest pain, palpitations and leg swelling.  Gastrointestinal: Negative for nausea, vomiting, abdominal pain, diarrhea, constipation, blood in stool, abdominal distention and anal bleeding.  Genitourinary: Negative for dysuria, hematuria, flank pain and difficulty urinating.  Musculoskeletal: Negative for arthralgias, back pain, gait problem, joint swelling and myalgias.  Skin: Negative for color change, pallor, rash and wound.  Neurological: Negative for dizziness, tremors, weakness and light-headedness.  Hematological: Negative for adenopathy. Does not bruise/bleed easily.  Psychiatric/Behavioral: Negative for behavioral problems, confusion, sleep disturbance, dysphoric mood, decreased concentration and agitation.       Objective:   Physical Exam  Constitutional:  He is oriented to person, place, and time. He appears well-developed and well-nourished. No distress.  HENT:  Head: Normocephalic and atraumatic.  Mouth/Throat: Oropharynx is clear and moist. No oropharyngeal exudate.  Eyes: Conjunctivae and EOM are normal. Pupils are equal, round, and reactive to light. No scleral icterus.  Neck: Normal range of motion. Neck supple. No JVD present.  Cardiovascular: Normal rate, regular rhythm and normal heart sounds.  Exam reveals no gallop and no friction rub.   No murmur heard. Pulmonary/Chest: Effort normal and breath sounds normal. No respiratory distress. He has no wheezes. He has no rales. He exhibits no tenderness.  Abdominal: He exhibits no distension and no mass. There is no tenderness. There is no rebound and no guarding.  Musculoskeletal: He exhibits no edema and no tenderness.  Lymphadenopathy:    He has no cervical adenopathy.  Neurological: He is alert and oriented to person, place, and time. He has normal reflexes. He exhibits normal muscle tone. Coordination normal.  Skin: Skin is warm and dry. He is not diaphoretic. No erythema. No pallor.  Psychiatric: He has a normal mood and affect. His behavior is normal. Judgment and thought content normal.          Assessment & Plan:   HIV: continue Atripla   Hyperlipidemia continue statin.  Anxiety continue Xanax.  Insomnia updated continue Ambien although he could use Xanax and said of his Ambien.  Hypertension:  Still up this visit, he thinks due to white coat HTN  Smoking: having hard time quitting.

## 2013-04-19 NOTE — Progress Notes (Signed)
Subjective:    Patient ID: Dale Mcdonald, male    DOB: 1951-04-04, 62 y.o.   MRN: [id number]  HPI   62 y.o. male who is doing superbly well on his antiviral regimen of Atripla to which he had been changed from his prior regimen of Reyataz Norvir Truvada which he had received through AIDS clinical trials group 5257.   He is doing very well and is just into the Atripla. His not having vivid dreams. His LDL did come up  We increased his Lipitor 20 mg daily. He has tried unsuccessfully trying to stop smoking. He is still under a considerable amount of duress related to his wife confusing medical condition which is apparently in an autoimmune condition. Blood pressure was up again in clinic but he assures me has been more well-controlled at home and that much of this is whitecoat hypertension.  His been enrolled in HALO study in the [facility].     Review of Systems  Constitutional: Negative for fever, chills, diaphoresis, activity change, appetite change, fatigue and unexpected weight change.  HENT: Negative for congestion, rhinorrhea, sinus pressure, sneezing, sore throat and trouble swallowing.   Eyes: Negative for photophobia and visual disturbance.  Respiratory: Negative for cough, chest tightness, shortness of breath, wheezing and stridor.   Cardiovascular: Negative for chest pain, palpitations and leg swelling.  Gastrointestinal: Negative for nausea, vomiting, abdominal pain, diarrhea, constipation, blood in stool, abdominal distention and anal bleeding.  Genitourinary: Negative for dysuria, hematuria, flank pain and difficulty urinating.  Musculoskeletal: Negative for arthralgias, back pain, gait problem, joint swelling and myalgias.  Skin: Negative for color change, pallor, rash and wound.  Neurological: Negative for dizziness, tremors, weakness and light-headedness.  Hematological: Negative for adenopathy. Does not bruise/bleed easily.  Psychiatric/Behavioral: Negative for behavioral  problems, confusion, sleep disturbance, dysphoric mood, decreased concentration and agitation.       Objective:   Physical Exam  Constitutional: He is oriented to person, place, and time. He appears well-developed and well-nourished. No distress.  HENT:  Head: Normocephalic and atraumatic.  Mouth/Throat: Oropharynx is clear and moist. No oropharyngeal exudate.  Eyes: Conjunctivae and EOM are normal. Pupils are equal, round, and reactive to light. No scleral icterus.  Neck: Normal range of motion. Neck supple. No JVD present.  Cardiovascular: Normal rate, regular rhythm and normal heart sounds.  Exam reveals no gallop and no friction rub.   No murmur heard. Pulmonary/Chest: Effort normal and breath sounds normal. No respiratory distress. He has no wheezes. He has no rales. He exhibits no tenderness.  Abdominal: He exhibits no distension and no mass. There is no tenderness. There is no rebound and no guarding.  Musculoskeletal: He exhibits no edema and no tenderness.  Lymphadenopathy:    He has no cervical adenopathy.  Neurological: He is alert and oriented to person, place, and time. He has normal reflexes. He exhibits normal muscle tone. Coordination normal.  Skin: Skin is warm and dry. He is not diaphoretic. No erythema. No pallor.  Psychiatric: He has a normal mood and affect. His behavior is normal. Judgment and thought content normal.          Assessment & Plan:   HIV: continue Atripla, consider STRIIVING  Hyperlipidemia continue statin.  Anxiety continue Xanax.  Insomnia updated continue Ambien although he could use Xanax and said of his Ambien.  Hypertension: On amlodipine likely has a component of whitecoat hypertension as he claims that his blood pressure much better at home.  Smoking: having hard time  quitting.

## 2013-04-19 NOTE — Addendum Note (Signed)
Addended by: Acey Lav N on: 04/19/2013 11:08 AM   Modules accepted: Level of Service

## 2013-04-21 LAB — HIV-1 RNA QUANT-NO REFLEX-BLD
HIV 1 RNA Quant: <20 copies/mL (ref <20)
HIV-1 RNA Quant, Log: <1.3 {Log} (ref <1.30)

## 2013-06-20 ENCOUNTER — Ambulatory Visit (INDEPENDENT_AMBULATORY_CARE_PROVIDER_SITE_OTHER): Payer: Self-pay | Admitting: *Deleted

## 2013-06-20 VITALS — BP 150/71 | HR 70 | Temp 98.4°F | Resp 16 | Ht 70.5 in | Wt 188.5 lb

## 2013-06-20 DIAGNOSIS — B20 Human immunodeficiency virus [HIV] disease: Secondary | ICD-10-CM

## 2013-06-20 DIAGNOSIS — Z21 Asymptomatic human immunodeficiency virus [HIV] infection status: Secondary | ICD-10-CM

## 2013-06-20 LAB — COMPREHENSIVE METABOLIC PANEL
ALK PHOS: 118 U/L — AB (ref 39–117)
ALT: 35 U/L (ref 0–53)
AST: 32 U/L (ref 0–37)
Albumin: 4.9 g/dL (ref 3.5–5.2)
BUN: 9 mg/dL (ref 6–23)
CO2: 30 meq/L (ref 19–32)
CREATININE: 0.97 mg/dL (ref 0.50–1.35)
Calcium: 9.8 mg/dL (ref 8.4–10.5)
Chloride: 95 mEq/L — ABNORMAL LOW (ref 96–112)
Glucose, Bld: 105 mg/dL — ABNORMAL HIGH (ref 70–99)
Potassium: 3.9 mEq/L (ref 3.5–5.3)
Sodium: 135 mEq/L (ref 135–145)
Total Bilirubin: 0.4 mg/dL (ref 0.3–1.2)
Total Protein: 8.1 g/dL (ref 6.0–8.3)

## 2013-06-20 LAB — HEMOGLOBIN A1C
Hgb A1c MFr Bld: 6.1 % — ABNORMAL HIGH (ref <5.7)
Mean Plasma Glucose: 128 mg/dL — ABNORMAL HIGH (ref <117)

## 2013-06-20 LAB — CBC WITH DIFFERENTIAL/PLATELET
BASOS PCT: 1 % (ref 0–1)
Basophils Absolute: 0.1 10*3/uL (ref 0.0–0.1)
EOS PCT: 2 % (ref 0–5)
Eosinophils Absolute: 0.1 10*3/uL (ref 0.0–0.7)
HCT: 45.5 % (ref 39.0–52.0)
Hemoglobin: 16 g/dL (ref 13.0–17.0)
LYMPHS ABS: 2 10*3/uL (ref 0.7–4.0)
Lymphocytes Relative: 27 % (ref 12–46)
MCH: 33.1 pg (ref 26.0–34.0)
MCHC: 35.2 g/dL (ref 30.0–36.0)
MCV: 94 fL (ref 78.0–100.0)
Monocytes Absolute: 0.6 10*3/uL (ref 0.1–1.0)
Monocytes Relative: 8 % (ref 3–12)
NEUTROS PCT: 62 % (ref 43–77)
Neutro Abs: 4.5 10*3/uL (ref 1.7–7.7)
Platelets: 292 10*3/uL (ref 150–400)
RBC: 4.84 MIL/uL (ref 4.22–5.81)
RDW: 13.6 % (ref 11.5–15.5)
WBC: 7.3 10*3/uL (ref 4.0–10.5)

## 2013-06-20 LAB — LIPID PANEL
CHOL/HDL RATIO: 3.4 ratio
CHOLESTEROL: 173 mg/dL (ref 0–200)
HDL: 51 mg/dL (ref >39)
LDL CALC: 96 mg/dL (ref 0–99)
TRIGLYCERIDES: 128 mg/dL (ref <150)
VLDL: 26 mg/dL (ref 0–40)

## 2013-06-20 NOTE — Progress Notes (Signed)
Patient here for week 65 A5322 Study visit. He denies any new health problems, but does say he has increased stress at home . He wife has been chronically ill for some time and has now been diagnosed with an autoimmune disorder. He will return to see Dr. Tommy Medal in the Spring and research in July.

## 2013-06-21 LAB — PROTEIN, URINE, RANDOM: TOTAL PROTEIN, URINE: 10 mg/dL

## 2013-06-21 LAB — CREATININE, URINE, RANDOM: CREATININE, URINE: 69.3 mg/dL

## 2013-09-01 ENCOUNTER — Other Ambulatory Visit: Payer: Self-pay | Admitting: *Deleted

## 2013-09-01 DIAGNOSIS — G4700 Insomnia, unspecified: Secondary | ICD-10-CM

## 2013-09-01 MED ORDER — ZOLPIDEM TARTRATE 10 MG PO TABS: ORAL_TABLET | ORAL | Status: DC

## 2013-12-04 ENCOUNTER — Encounter: Payer: Self-pay | Admitting: Infectious Disease

## 2013-12-04 ENCOUNTER — Other Ambulatory Visit: Payer: Self-pay | Admitting: Infectious Disease

## 2013-12-05 ENCOUNTER — Other Ambulatory Visit: Payer: Self-pay | Admitting: *Deleted

## 2013-12-05 DIAGNOSIS — B20 Human immunodeficiency virus [HIV] disease: Secondary | ICD-10-CM

## 2013-12-05 MED ORDER — EFAVIRENZ-EMTRICITAB-TENOFOVIR 600-200-300 MG PO TABS: 1.0000 | ORAL_TABLET | Freq: Every day | ORAL | Status: DC

## 2013-12-06 ENCOUNTER — Other Ambulatory Visit: Payer: Managed Care, Other (non HMO)

## 2013-12-06 ENCOUNTER — Ambulatory Visit (INDEPENDENT_AMBULATORY_CARE_PROVIDER_SITE_OTHER): Payer: Self-pay | Admitting: *Deleted

## 2013-12-06 VITALS — BP 134/81 | HR 72 | Temp 98.4°F | Wt 188.8 lb

## 2013-12-06 DIAGNOSIS — B20 Human immunodeficiency virus [HIV] disease: Secondary | ICD-10-CM

## 2013-12-06 LAB — CBC WITH DIFFERENTIAL/PLATELET
BASOS ABS: 0.1 10*3/uL (ref 0.0–0.1)
Basophils Relative: 1 % (ref 0–1)
EOS PCT: 2 % (ref 0–5)
Eosinophils Absolute: 0.1 10*3/uL (ref 0.0–0.7)
HCT: 44.8 % (ref 39.0–52.0)
Hemoglobin: 15.9 g/dL (ref 13.0–17.0)
Lymphocytes Relative: 28 % (ref 12–46)
Lymphs Abs: 1.9 10*3/uL (ref 0.7–4.0)
MCH: 32.3 pg (ref 26.0–34.0)
MCHC: 35.5 g/dL (ref 30.0–36.0)
MCV: 91.1 fL (ref 78.0–100.0)
MONO ABS: 0.5 10*3/uL (ref 0.1–1.0)
MONOS PCT: 8 % (ref 3–12)
Neutro Abs: 4.1 10*3/uL (ref 1.7–7.7)
Neutrophils Relative %: 61 % (ref 43–77)
Platelets: 262 10*3/uL (ref 150–400)
RBC: 4.92 MIL/uL (ref 4.22–5.81)
RDW: 13.9 % (ref 11.5–15.5)
WBC: 6.7 10*3/uL (ref 4.0–10.5)

## 2013-12-06 LAB — COMPREHENSIVE METABOLIC PANEL
ALBUMIN: 4.6 g/dL (ref 3.5–5.2)
ALT: 43 U/L (ref 0–53)
AST: 32 U/L (ref 0–37)
Alkaline Phosphatase: 111 U/L (ref 39–117)
BUN: 7 mg/dL (ref 6–23)
CHLORIDE: 97 meq/L (ref 96–112)
CO2: 26 mEq/L (ref 19–32)
CREATININE: 0.85 mg/dL (ref 0.50–1.35)
Calcium: 9.6 mg/dL (ref 8.4–10.5)
GLUCOSE: 109 mg/dL — AB (ref 70–99)
Potassium: 3.6 mEq/L (ref 3.5–5.3)
Sodium: 134 mEq/L — ABNORMAL LOW (ref 135–145)
Total Bilirubin: 0.4 mg/dL (ref 0.2–1.2)
Total Protein: 7.2 g/dL (ref 6.0–8.3)

## 2013-12-06 NOTE — Progress Notes (Signed)
Dale Mcdonald is here for his week 2 Hailo study visit. He denies any new health problems, but does say he has increased stress with dealing with his wife's illnesses. He plans to retire this year so he can be able to care for her full-time. He has an appt with Dr. Tommy Medal in a few weeks and will return for study in 6 months. We drew a viral load on study and drew the other labs for his upcoming appt. with his doctor.

## 2013-12-07 LAB — T-HELPER CELL (CD4) - (RCID CLINIC ONLY)
CD4 T CELL HELPER: 42 % (ref 33–55)
CD4 T Cell Abs: 880 /uL (ref 400–2700)

## 2013-12-20 ENCOUNTER — Encounter: Payer: Self-pay | Admitting: Internal Medicine

## 2013-12-20 ENCOUNTER — Ambulatory Visit (INDEPENDENT_AMBULATORY_CARE_PROVIDER_SITE_OTHER): Payer: Managed Care, Other (non HMO) | Admitting: Internal Medicine

## 2013-12-20 VITALS — BP 142/80 | HR 78 | Temp 98.4°F | Ht 70.5 in | Wt 188.0 lb

## 2013-12-20 DIAGNOSIS — F439 Reaction to severe stress, unspecified: Secondary | ICD-10-CM

## 2013-12-20 DIAGNOSIS — E785 Hyperlipidemia, unspecified: Secondary | ICD-10-CM

## 2013-12-20 DIAGNOSIS — Z0000 Encounter for general adult medical examination without abnormal findings: Secondary | ICD-10-CM

## 2013-12-20 DIAGNOSIS — Z733 Stress, not elsewhere classified: Secondary | ICD-10-CM

## 2013-12-20 DIAGNOSIS — I10 Essential (primary) hypertension: Secondary | ICD-10-CM

## 2013-12-20 MED ORDER — AMLODIPINE BESYLATE 5 MG PO TABS: 5.0000 mg | ORAL_TABLET | Freq: Every day | ORAL | Status: DC

## 2013-12-20 MED ORDER — ALPRAZOLAM 0.25 MG PO TABS: 0.2500 mg | ORAL_TABLET | Freq: Two times a day (BID) | ORAL | Status: DC | PRN

## 2013-12-20 MED ORDER — ATORVASTATIN CALCIUM 20 MG PO TABS
20.0000 mg | ORAL_TABLET | Freq: Every day | ORAL | Status: DC
Start: 2013-12-20 — End: 2015-12-25

## 2013-12-20 MED ORDER — LISINOPRIL-HYDROCHLOROTHIAZIDE 20-25 MG PO TABS: 1.0000 | ORAL_TABLET | Freq: Every day | ORAL | Status: DC

## 2013-12-20 NOTE — Assessment & Plan Note (Signed)
With wife's ongoing medical issues Now even has to retire early

## 2013-12-20 NOTE — Progress Notes (Signed)
Pre visit review using our clinic review tool, if applicable. No additional management support is needed unless otherwise documented below in the visit note. 

## 2013-12-20 NOTE — Assessment & Plan Note (Signed)
BP Readings from Last 3 Encounters:  12/20/13 142/80  12/06/13 134/81  06/20/13 150/71   Acceptable No changes needed

## 2013-12-20 NOTE — Patient Instructions (Signed)
Call 1-800 QUIT NOW for help in stopping smoking.  Exercise to Lose Weight Exercise and a healthy diet may help you lose weight. Your doctor may suggest specific exercises. EXERCISE IDEAS AND TIPS  Choose low-cost things you enjoy doing, such as walking, bicycling, or exercising to workout videos.  Take stairs instead of the elevator.  Walk during your lunch break.  Park your car further away from work or school.  Go to a gym or an exercise class.  Start with 5 to 10 minutes of exercise each day. Build up to 30 minutes of exercise 4 to 6 days a week.  Wear shoes with good support and comfortable clothes.  Stretch before and after working out.  Work out until you breathe harder and your heart beats faster.  Drink extra water when you exercise.  Do not do so much that you hurt yourself, feel dizzy, or get very short of breath. Exercises that burn about 150 calories:  Running 1  miles in 15 minutes.  Playing volleyball for 45 to 60 minutes.  Washing and waxing a car for 45 to 60 minutes.  Playing touch football for 45 minutes.  Walking 1  miles in 35 minutes.  Pushing a stroller 1  miles in 30 minutes.  Playing basketball for 30 minutes.  Raking leaves for 30 minutes.  Bicycling 5 miles in 30 minutes.  Walking 2 miles in 30 minutes.  Dancing for 30 minutes.  Shoveling snow for 15 minutes.  Swimming laps for 20 minutes.  Walking up stairs for 15 minutes.  Bicycling 4 miles in 15 minutes.  Gardening for 30 to 45 minutes.  Jumping rope for 15 minutes.  Washing windows or floors for 45 to 60 minutes. Document Released: 06/13/2010 Document Revised: 08/03/2011 Document Reviewed: 06/13/2010 [facility] Patient Information 2015 [place], [place]. This information is not intended to replace advice given to you by your health care provider. Make sure you discuss any questions you have with your health care provider. DASH Eating Plan DASH stands for "Dietary  Approaches to Stop Hypertension." The DASH eating plan is a healthy eating plan that has been shown to reduce high blood pressure (hypertension). Additional health benefits may include reducing the risk of type 2 diabetes mellitus, heart disease, and stroke. The DASH eating plan may also help with weight loss. WHAT DO I NEED TO KNOW ABOUT THE DASH EATING PLAN? For the DASH eating plan, you will follow these general guidelines:  Choose foods with a percent daily value for sodium of less than 5% (as listed on the food label).  Use salt-free seasonings or herbs instead of table salt or sea salt.  Check with your health care provider or pharmacist before using salt substitutes.  Eat lower-sodium products, often labeled as "lower sodium" or "no salt added."  Eat fresh foods.  Eat more vegetables, fruits, and low-fat dairy products.  Choose whole grains. Look for the word "whole" as the first word in the ingredient list.  Choose fish and skinless chicken or [place] more often than red meat. Limit fish, poultry, and meat to 6 oz (170 g) each day.  Limit sweets, desserts, sugars, and sugary drinks.  Choose heart-healthy fats.  Limit cheese to 1 oz (28 g) per day.  Eat more home-cooked food and less restaurant, buffet, and fast food.  Limit fried foods.  Cook foods using methods other than frying.  Limit canned vegetables. If you do use them, rinse them well to decrease the sodium.  When eating  at a restaurant, ask that your food be prepared with less salt, or no salt if possible. WHAT FOODS CAN I EAT? Seek help from a dietitian for individual calorie needs. Grains Whole grain or whole wheat bread. Brown rice. Whole grain or whole wheat pasta. Quinoa, bulgur, and whole grain cereals. Low-sodium cereals. Corn or whole wheat flour tortillas. Whole grain cornbread. Whole grain crackers. Low-sodium crackers. Vegetables Fresh or frozen vegetables (raw, steamed, roasted, or grilled).  Low-sodium or reduced-sodium tomato and vegetable juices. Low-sodium or reduced-sodium tomato sauce and paste. Low-sodium or reduced-sodium canned vegetables.  Fruits All fresh, canned (in natural juice), or frozen fruits. Meat and Other Protein Products Ground beef (85% or leaner), grass-fed beef, or beef trimmed of fat. Skinless chicken or [place]. Ground chicken or [place]. Pork trimmed of fat. All fish and seafood. Eggs. Dried beans, peas, or lentils. Unsalted nuts and seeds. Unsalted canned beans. Dairy Low-fat dairy products, such as skim or 1% milk, 2% or reduced-fat cheeses, low-fat ricotta or cottage cheese, or plain low-fat yogurt. Low-sodium or reduced-sodium cheeses. Fats and Oils Tub margarines without trans fats. Light or reduced-fat mayonnaise and salad dressings (reduced sodium). Avocado. Safflower, olive, or canola oils. Natural peanut or almond butter. Other Unsalted popcorn and pretzels. The items listed above may not be a complete list of recommended foods or beverages. Contact your dietitian for more options. WHAT FOODS ARE NOT RECOMMENDED? Grains White bread. White pasta. White rice. Refined cornbread. Bagels and croissants. Crackers that contain trans fat. Vegetables Creamed or fried vegetables. Vegetables in a cheese sauce. Regular canned vegetables. Regular canned tomato sauce and paste. Regular tomato and vegetable juices. Fruits Dried fruits. Canned fruit in light or heavy syrup. Fruit juice. Meat and Other Protein Products Fatty cuts of meat. Ribs, chicken wings, bacon, sausage, bologna, salami, chitterlings, fatback, hot dogs, bratwurst, and packaged luncheon meats. Salted nuts and seeds. Canned beans with salt. Dairy Whole or 2% milk, cream, half-and-half, and cream cheese. Whole-fat or sweetened yogurt. Full-fat cheeses or blue cheese. Nondairy creamers and whipped toppings. Processed cheese, cheese spreads, or cheese curds. Condiments Onion and garlic salt,  seasoned salt, table salt, and sea salt. Canned and packaged gravies. Worcestershire sauce. Tartar sauce. Barbecue sauce. Teriyaki sauce. Soy sauce, including reduced sodium. Steak sauce. Fish sauce. Oyster sauce. Cocktail sauce. Horseradish. Ketchup and mustard. Meat flavorings and tenderizers. Bouillon cubes. Hot sauce. Tabasco sauce. Marinades. Taco seasonings. Relishes. Fats and Oils Butter, stick margarine, lard, shortening, ghee, and bacon fat. Coconut, palm kernel, or palm oils. Regular salad dressings. Other Pickles and olives. Salted popcorn and pretzels. The items listed above may not be a complete list of foods and beverages to avoid. Contact your dietitian for more information. WHERE CAN I FIND MORE INFORMATION? National Heart, Lung, and [employer] Institute: travelstabloid.com Document Released: 04/30/2011 Document Revised: 09/25/2013 Document Reviewed: 03/15/2013 [facility] Patient Information 2015 [place], [place]. This information is not intended to replace advice given to you by your health care provider. Make sure you discuss any questions you have with your health care provider.

## 2013-12-20 NOTE — Progress Notes (Signed)
Subjective:    Patient ID: Dale Mcdonald, male    DOB: 1950/08/07, 63 y.o.   MRN: [id number]  HPI Here for physical  Having problems with loose stools Wonders if it could be related to [employer] if it is an age related thing---knows chocolate tears him up Only 1 stool per day--after AM coffee No blood  Better after having bran cereal Even if from atripla--no reason to make any changes  Interested in zostvax I thought he had it but he says he didn't  Told him to check with Dr Lajean Manes be okay if his T4 count is okay  Will be taking early retirement due to wife's health issues Lots of stress with this  Still smokes Knows he should quit but not ready yet-hopes he can have less stress after retiring Current Outpatient Prescriptions on File Prior to Visit  Medication Sig Dispense Refill  . ALPRAZolam (XANAX) 0.25 MG tablet TAKE 1 TABLET BY MOUTH TWICE A DAY AS NEEDED FOR ANXIETY  30 tablet  0  . amLODipine (NORVASC) 5 MG tablet TAKE 1 TABLET BY MOUTH EVERY DAY  30 tablet  1  . atorvastatin (LIPITOR) 20 MG tablet Take 1 tablet (20 mg total) by mouth daily.  90 tablet  4  . efavirenz-emtricitabine-tenofovir (ATRIPLA) 600-200-300 MG per tablet Take 1 tablet by mouth at bedtime.  90 tablet  0  . lisinopril-hydrochlorothiazide (PRINZIDE,ZESTORETIC) 20-25 MG per tablet TAKE 1 TABLET BY MOUTH EVERY DAY  90 tablet  1   No current facility-administered medications on file prior to visit.    No Known Allergies  Past Medical History  Diagnosis Date  . Anxiety   . Hyperlipidemia   . Hypertension   . HIV (human immunodeficiency virus infection)     No past surgical history on file.  No family history on file.  History   Social History  . Marital Status: Married    Spouse Name: N/A    Number of Children: 1  . Years of Education: N/A   Occupational History  . 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 Smoking  status: Current Every Day Smoker -- 1.50 packs/day    Types: Cigarettes  . Smokeless tobacco: Never Used     Comment: gave 1-800-QUIT-NOW  . Alcohol Use: No  . Drug Use: No  . Sexual Activity: Not on file   Other Topics Concern  . Not on file   Social History Narrative  . No narrative on file   Review of Systems  Constitutional: Negative for fatigue and unexpected weight change.       Wears seat belt  HENT: Negative for dental problem, hearing loss and tinnitus.        Keeps up with dentist  Eyes: Negative for visual disturbance.       No diplopia or unilateral vision loss   Respiratory: Positive for cough. Negative for chest tightness and shortness of breath.        Chronic AM cough  Cardiovascular: Positive for palpitations. Negative for chest pain and leg swelling.       Palpitations with stress  Gastrointestinal: Positive for diarrhea. Negative for nausea, vomiting, abdominal pain and blood in stool.       No heartburn  Endocrine: Positive for heat intolerance. Negative for cold intolerance.  Genitourinary: Negative for urgency, frequency and difficulty urinating.       No sex--no problem  Musculoskeletal: Positive for arthralgias. Negative  for back pain and joint swelling.       Mild knee pain  Skin: Negative for rash.       Some flaking in ears No suspicious lesions  Allergic/Immunologic: Positive for environmental allergies.       Uses flonase and OTC tab  Neurological: Positive for headaches. Negative for dizziness, syncope, weakness, light-headedness and numbness.       Sinus headaches at times  Hematological: Negative for adenopathy. Bruises/bleeds easily.  Psychiatric/Behavioral: Positive for dysphoric mood. Negative for sleep disturbance. The patient is nervous/anxious.        Has had mood issues due to wife's medical issues---tremendous strain       Objective:   Physical Exam  Constitutional: He is oriented to person, place, and time. He appears  well-developed and well-nourished. No distress.  HENT:  Head: Normocephalic and atraumatic.  Right Ear: External ear normal.  Left Ear: External ear normal.  Mouth/Throat: Oropharynx is clear and moist. No oropharyngeal exudate.  Eyes: Conjunctivae and EOM are normal. Pupils are equal, round, and reactive to light.  Neck: Normal range of motion. Neck supple. No thyromegaly present.  Cardiovascular: Normal rate, regular rhythm, normal heart sounds and intact distal pulses.  Exam reveals no gallop.   No murmur heard. trigeminy  Pulmonary/Chest: Effort normal and breath sounds normal. No respiratory distress. He has no wheezes. He has no rales.  Abdominal: Soft. He exhibits no distension. There is no tenderness. There is no rebound and no guarding.  Musculoskeletal: He exhibits no edema and no tenderness.  Lymphadenopathy:    He has no cervical adenopathy.  Neurological: He is alert and oriented to person, place, and time.  Skin: No rash noted. No erythema.  Psychiatric: He has a normal mood and affect. His behavior is normal.          Assessment & Plan:

## 2013-12-20 NOTE — Assessment & Plan Note (Signed)
Healthy Doing well with HIV regimen--loose stools will probably improve with increased fiber No PSA after discussion  Will review zostavax with Dr Drucilla Schmidt

## 2013-12-20 NOTE — Assessment & Plan Note (Signed)
No problems with the statin Had labs in January

## 2013-12-21 ENCOUNTER — Encounter: Payer: Self-pay | Admitting: Infectious Disease

## 2013-12-21 ENCOUNTER — Ambulatory Visit (INDEPENDENT_AMBULATORY_CARE_PROVIDER_SITE_OTHER): Payer: Managed Care, Other (non HMO) | Admitting: Infectious Disease

## 2013-12-21 VITALS — BP 146/87 | HR 88 | Temp 98.4°F | Ht 71.0 in | Wt 188.0 lb

## 2013-12-21 DIAGNOSIS — I10 Essential (primary) hypertension: Secondary | ICD-10-CM

## 2013-12-21 DIAGNOSIS — E785 Hyperlipidemia, unspecified: Secondary | ICD-10-CM

## 2013-12-21 DIAGNOSIS — G4700 Insomnia, unspecified: Secondary | ICD-10-CM

## 2013-12-21 DIAGNOSIS — B20 Human immunodeficiency virus [HIV] disease: Secondary | ICD-10-CM

## 2013-12-21 MED ORDER — ZOSTER VACCINE LIVE 19400 UNT/0.65ML ~~LOC~~ SOLR: 0.6500 mL | Freq: Once | SUBCUTANEOUS | Status: DC

## 2013-12-21 NOTE — Progress Notes (Signed)
  Subjective:    Patient ID: Dale Mcdonald, male    DOB: 07/04/1950, 63 y.o.   MRN: [id number]  HPI   63 y.o. male who is doing superbly well on his antiviral regimen of Atripla to which he had been changed from his prior regimen of Reyataz Norvir Truvada which he had received through AIDS clinical trials group 5257.   He is doing very well and is just into the Atripla. His not having vivid dreams.    He is still under a considerable amount of duress related to his wife confusing medical condition which is apparently in an autoimmune condition  His been enrolled in HALO study in the [facility].  Lab Results  Component Value Date   HIV1RNAQUANT <20 04/19/2013   Lab Results  Component Value Date   CD4TABS 880 12/06/2013   CD4TABS 940 04/19/2013   CD4TABS 850 01/18/2013       Review of Systems  Constitutional: Negative for fever, chills, diaphoresis, activity change, appetite change, fatigue and unexpected weight change.  HENT: Negative for congestion, rhinorrhea, sinus pressure, sneezing, sore throat and trouble swallowing.   Eyes: Negative for photophobia and visual disturbance.  Respiratory: Negative for cough, chest tightness, shortness of breath, wheezing and stridor.   Cardiovascular: Negative for chest pain, palpitations and leg swelling.  Gastrointestinal: Negative for nausea, vomiting, abdominal pain, diarrhea, constipation, blood in stool, abdominal distention and anal bleeding.  Genitourinary: Negative for dysuria, hematuria, flank pain and difficulty urinating.  Musculoskeletal: Negative for arthralgias, back pain, gait problem, joint swelling and myalgias.  Skin: Negative for color change, pallor, rash and wound.  Neurological: Negative for dizziness, tremors, weakness and light-headedness.  Hematological: Negative for adenopathy. Does not bruise/bleed easily.  Psychiatric/Behavioral: Negative for behavioral problems, confusion, sleep disturbance, dysphoric mood, decreased  concentration and agitation.       Objective:   Physical Exam  Constitutional: He is oriented to person, place, and time. He appears well-developed and well-nourished. No distress.  HENT:  Head: Normocephalic and atraumatic.  Mouth/Throat: Oropharynx is clear and moist. No oropharyngeal exudate.  Eyes: Conjunctivae and EOM are normal. Pupils are equal, round, and reactive to light. No scleral icterus.  Neck: Normal range of motion. Neck supple. No JVD present.  Cardiovascular: Normal rate, regular rhythm and normal heart sounds.  Exam reveals no gallop and no friction rub.   No murmur heard. Pulmonary/Chest: Effort normal and breath sounds normal. No respiratory distress. He has no wheezes. He has no rales. He exhibits no tenderness.  Abdominal: He exhibits no distension and no mass. There is no tenderness. There is no rebound and no guarding.  Musculoskeletal: He exhibits no edema and no tenderness.  Lymphadenopathy:    He has no cervical adenopathy.  Neurological: He is alert and oriented to person, place, and time. He has normal reflexes. He exhibits normal muscle tone. Coordination normal.  Skin: Skin is warm and dry. He is not diaphoretic. No erythema. No pallor.  Psychiatric: He has a normal mood and affect. His behavior is normal. Judgment and thought content normal.          Assessment & Plan:   HIV: continue Atripla, check VL today  Hyperlipidemia continue statin.  Insomnia updated continue Ambien although he could use Xanax and said of his Ambien.  Hypertension:  PCP managing this  Smoking: having hard time quitting.

## 2013-12-23 LAB — HIV-1 RNA QUANT-NO REFLEX-BLD
HIV 1 RNA Quant: <20 copies/mL (ref <20)
HIV-1 RNA Quant, Log: <1.3 {Log} (ref <1.30)

## 2014-01-11 ENCOUNTER — Encounter: Payer: Self-pay | Admitting: Infectious Disease

## 2014-01-11 LAB — HIV-1 RNA QUANT-NO REFLEX-BLD

## 2014-01-13 ENCOUNTER — Other Ambulatory Visit: Payer: Self-pay | Admitting: Infectious Disease

## 2014-01-30 ENCOUNTER — Other Ambulatory Visit: Payer: Self-pay | Admitting: *Deleted

## 2014-01-30 ENCOUNTER — Encounter: Payer: Self-pay | Admitting: Infectious Disease

## 2014-01-30 DIAGNOSIS — B20 Human immunodeficiency virus [HIV] disease: Secondary | ICD-10-CM

## 2014-01-30 MED ORDER — ALPRAZOLAM 0.25 MG PO TABS: 0.2500 mg | ORAL_TABLET | Freq: Two times a day (BID) | ORAL | Status: DC | PRN

## 2014-01-30 MED ORDER — EFAVIRENZ-EMTRICITAB-TENOFOVIR 600-200-300 MG PO TABS: 1.0000 | ORAL_TABLET | Freq: Every day | ORAL | Status: DC

## 2014-04-23 ENCOUNTER — Encounter: Payer: Self-pay | Admitting: *Deleted

## 2014-04-23 ENCOUNTER — Telehealth: Payer: Self-pay | Admitting: *Deleted

## 2014-04-23 DIAGNOSIS — G4700 Insomnia, unspecified: Secondary | ICD-10-CM

## 2014-04-23 NOTE — Telephone Encounter (Signed)
I will write him a 2 month supply but he has to discuss further rx refills with Dr. Silvio Pate

## 2014-04-23 NOTE — Telephone Encounter (Signed)
Ambien rx had been removed from the pt' medication profile by his PCP's office, Dr. Silvio Pate, at his visit last July.  Per the pt he is still taking Ambien just not every night.  He needs it occassionaly, Dr. Alla German office was wrong.  He does not take Xanax in the evening.  Pt is requesting a refill.  MD, OK to refill per the current instructions, # of tablets and # of refills.  Will ask to pt to make a follow-up visit too.

## 2014-04-25 MED ORDER — ZOLPIDEM TARTRATE 10 MG PO TABS: 10.0000 mg | ORAL_TABLET | Freq: Every evening | ORAL | Status: AC | PRN

## 2014-04-25 NOTE — Addendum Note (Signed)
Addended by: Lorne Skeens D on: 04/25/2014 12:49 PM   Modules accepted: Orders

## 2014-04-25 NOTE — Telephone Encounter (Signed)
I thought I already did this?

## 2014-06-06 ENCOUNTER — Other Ambulatory Visit: Payer: Self-pay | Admitting: *Deleted

## 2014-06-06 ENCOUNTER — Encounter: Payer: Self-pay | Admitting: Infectious Disease

## 2014-06-06 ENCOUNTER — Ambulatory Visit (INDEPENDENT_AMBULATORY_CARE_PROVIDER_SITE_OTHER): Payer: Self-pay | Admitting: *Deleted

## 2014-06-06 VITALS — BP 151/73 | HR 72 | Temp 98.0°F | Resp 16 | Ht 70.5 in | Wt 189.0 lb

## 2014-06-06 DIAGNOSIS — Z006 Encounter for examination for normal comparison and control in clinical research program: Secondary | ICD-10-CM

## 2014-06-06 DIAGNOSIS — Z21 Asymptomatic human immunodeficiency virus [HIV] infection status: Secondary | ICD-10-CM

## 2014-06-06 DIAGNOSIS — B20 Human immunodeficiency virus [HIV] disease: Secondary | ICD-10-CM

## 2014-06-06 LAB — HEMOGLOBIN A1C
HEMOGLOBIN A1C: 6.2 % — AB (ref <5.7)
Mean Plasma Glucose: 131 mg/dL — ABNORMAL HIGH (ref <117)

## 2014-06-06 LAB — COMPREHENSIVE METABOLIC PANEL
ALBUMIN: 4.5 g/dL (ref 3.5–5.2)
ALT: 51 U/L (ref 0–53)
AST: 39 U/L — AB (ref 0–37)
Alkaline Phosphatase: 120 U/L — ABNORMAL HIGH (ref 39–117)
BUN: 9 mg/dL (ref 6–23)
CO2: 26 mEq/L (ref 19–32)
Calcium: 10 mg/dL (ref 8.4–10.5)
Chloride: 98 mEq/L (ref 96–112)
Creat: 0.88 mg/dL (ref 0.50–1.35)
Glucose, Bld: 108 mg/dL — ABNORMAL HIGH (ref 70–99)
POTASSIUM: 3.9 meq/L (ref 3.5–5.3)
Sodium: 138 mEq/L (ref 135–145)
Total Bilirubin: 0.4 mg/dL (ref 0.2–1.2)
Total Protein: 8 g/dL (ref 6.0–8.3)

## 2014-06-06 LAB — LIPID PANEL
CHOL/HDL RATIO: 3.5 ratio
CHOLESTEROL: 155 mg/dL (ref 0–200)
HDL: 44 mg/dL (ref >39)
LDL Cholesterol: 88 mg/dL (ref 0–99)
Triglycerides: 116 mg/dL (ref <150)
VLDL: 23 mg/dL (ref 0–40)

## 2014-06-06 MED ORDER — EFAVIRENZ-EMTRICITAB-TENOFOVIR 600-200-300 MG PO TABS: 1.0000 | ORAL_TABLET | Freq: Every day | ORAL | Status: DC

## 2014-06-06 NOTE — Progress Notes (Signed)
Dale Mcdonald is here for his week 10 A5322 study visit. He denies any current problems but does say that last week he had a viral illness for 4 days consisting of URI, nausea, vomiting, body aches. He will return in 6 months for the next study visit.

## 2014-06-06 NOTE — Addendum Note (Signed)
Addended by: Bobbie Stack on: 06/06/2014 09:32 AM   Modules accepted: Orders

## 2014-06-07 LAB — HEPATITIS B SURFACE ANTIBODY,QUALITATIVE: Hep B S Ab: NEGATIVE

## 2014-06-07 LAB — HEPATITIS B SURFACE ANTIGEN: Hepatitis B Surface Ag: POSITIVE — AB

## 2014-06-07 LAB — CREATININE, URINE, RANDOM: CREATININE, URINE: 79.2 mg/dL

## 2014-06-07 LAB — PROTEIN, URINE, RANDOM: Total Protein, Urine: 13 mg/dL (ref 5–25)

## 2014-06-07 LAB — HEPATITIS C ANTIBODY: HCV Ab: NEGATIVE

## 2014-06-07 LAB — HEPATITIS B SURF AG CONFIRMATION: HEPATITIS B SURFACE ANTIGEN CONFIRMATION: POSITIVE — AB

## 2014-06-25 ENCOUNTER — Encounter: Payer: Self-pay | Admitting: Internal Medicine

## 2014-06-25 DIAGNOSIS — B20 Human immunodeficiency virus [HIV] disease: Secondary | ICD-10-CM

## 2014-06-27 ENCOUNTER — Encounter: Payer: Self-pay | Admitting: Infectious Disease

## 2014-06-27 LAB — CD4/CD8 (T-HELPER/T-SUPPRESSOR CELL)
CD4%: 47.5
CD4: 1378
CD8 % Suppressor T Cell: 39.3
CD8: 1140

## 2014-06-27 LAB — HIV-1 RNA QUANT-NO REFLEX-BLD: HIV-1 RNA Viral Load: <40

## 2014-07-03 ENCOUNTER — Encounter: Payer: Self-pay | Admitting: Internal Medicine

## 2014-07-03 NOTE — Telephone Encounter (Signed)
Please facilitate his referral for insurance for very necessary visit to Dr Lucianne Lei Dam---this really needs to be done today

## 2014-07-04 ENCOUNTER — Ambulatory Visit: Payer: Managed Care, Other (non HMO) | Admitting: Infectious Disease

## 2014-07-11 ENCOUNTER — Encounter: Payer: Self-pay | Admitting: Infectious Disease

## 2014-08-02 ENCOUNTER — Ambulatory Visit (INDEPENDENT_AMBULATORY_CARE_PROVIDER_SITE_OTHER): Payer: 59 | Admitting: Infectious Disease

## 2014-08-02 ENCOUNTER — Encounter: Payer: Self-pay | Admitting: Infectious Disease

## 2014-08-02 VITALS — BP 150/82 | HR 89 | Temp 98.0°F | Wt 194.0 lb

## 2014-08-02 DIAGNOSIS — B20 Human immunodeficiency virus [HIV] disease: Secondary | ICD-10-CM

## 2014-08-02 DIAGNOSIS — I10 Essential (primary) hypertension: Secondary | ICD-10-CM

## 2014-08-02 DIAGNOSIS — G4700 Insomnia, unspecified: Secondary | ICD-10-CM

## 2014-08-02 NOTE — Patient Instructions (Signed)
The two meds we are thinking about  ODEFSEY  (WITH 400 CALORIE MEAL) NO PROTON PUMP , AVOID H2 BLOCKERS, ANTACIDS, LOW SIDE EFFECTS  GENVOYA  WITH FOOD BUT NO CALORIE REQUIREMENT, NO WORRIES RE ANTACIDS, 10-15% HAVE GI SIDE EFFECTS THAT GO AWAY  BOTH WITH TAF-NEW TENOFOVIR SAFER ON BONE AND KIDNEY

## 2014-08-02 NOTE — Progress Notes (Signed)
  Subjective:    Patient ID: Dale Mcdonald, male    DOB: [birth year], 64 y.o.   MRN: [id number]  HPI   64 y.o. male who is doing superbly well on his antiviral regimen of Atripla to which he had been changed from his prior regimen of Reyataz Norvir Truvada which he had received through AIDS clinical trials group 5257 to Atripla.    He is still under a considerable amount of duress related to his wife confusing medical condition which is apparently in an autoimmune condition and Hep C, CKD. I offered to treat her Hep C.  His been enrolled in HALO study in the [facility].    Lab Results  Component Value Date   HIV1RNAQUANT <20 12/21/2013   Lab Results  Component Value Date   CD4TABS 1378 06/06/2014   CD4TABS 880 12/06/2013   CD4TABS 940 04/19/2013       Review of Systems  Constitutional: Negative for fever, chills, diaphoresis, activity change, appetite change, fatigue and unexpected weight change.  HENT: Negative for congestion, rhinorrhea, sinus pressure, sneezing, sore throat and trouble swallowing.   Eyes: Negative for photophobia and visual disturbance.  Respiratory: Negative for cough, chest tightness, shortness of breath, wheezing and stridor.   Cardiovascular: Negative for chest pain, palpitations and leg swelling.  Gastrointestinal: Negative for nausea, vomiting, abdominal pain, diarrhea, constipation, blood in stool, abdominal distention and anal bleeding.  Genitourinary: Negative for dysuria, hematuria, flank pain and difficulty urinating.  Musculoskeletal: Negative for myalgias, back pain, joint swelling, arthralgias and gait problem.  Skin: Negative for color change, pallor, rash and wound.  Neurological: Negative for dizziness, tremors, weakness and light-headedness.  Hematological: Negative for adenopathy. Does not bruise/bleed easily.  Psychiatric/Behavioral: Negative for behavioral problems, confusion, sleep disturbance, dysphoric mood, decreased concentration and  agitation.       Objective:   Physical Exam  Constitutional: He is oriented to person, place, and time. He appears well-developed and well-nourished. No distress.  HENT:  Head: Normocephalic and atraumatic.  Mouth/Throat: Oropharynx is clear and moist. No oropharyngeal exudate.  Eyes: Conjunctivae and EOM are normal. Pupils are equal, round, and reactive to light. No scleral icterus.  Neck: Normal range of motion. Neck supple. No JVD present.  Cardiovascular: Normal rate, regular rhythm and normal heart sounds.  Exam reveals no gallop and no friction rub.   No murmur heard. Pulmonary/Chest: Effort normal and breath sounds normal. No respiratory distress. He has no wheezes. He has no rales. He exhibits no tenderness.  Abdominal: He exhibits no distension and no mass. There is no tenderness. There is no rebound and no guarding.  Musculoskeletal: He exhibits no edema or tenderness.  Lymphadenopathy:    He has no cervical adenopathy.  Neurological: He is alert and oriented to person, place, and time. He has normal reflexes. He exhibits normal muscle tone. Coordination normal.  Skin: Skin is warm and dry. He is not diaphoretic. No erythema. No pallor.  Psychiatric: He has a normal mood and affect. His behavior is normal. Judgment and thought content normal.          Assessment & Plan:   HIV: continue Atripla but we are proposing for better bone and renal safety changing to [facility] or R-TAF/FTC  Hyperlipidemia continue statin.  Insomnia updated continue Ambien although he could use Xanax and said of his Ambien.  Hypertension:  PCP managing this  Smoking: having hard time quitting.

## 2014-08-03 ENCOUNTER — Encounter: Payer: Self-pay | Admitting: Infectious Disease

## 2014-08-03 ENCOUNTER — Telehealth: Payer: Self-pay | Admitting: Infectious Disease

## 2014-08-03 MED ORDER — ELVITEG-COBIC-EMTRICIT-TENOFAF 150-150-200-10 MG PO TABS
1.0000 | ORAL_TABLET | Freq: Every day | ORAL | Status: DC
Start: 2014-08-03 — End: 2014-08-14

## 2014-08-03 MED ORDER — DRONABINOL 5 MG PO CAPS: 5.0000 mg | ORAL_CAPSULE | Freq: Two times a day (BID) | ORAL | Status: DC

## 2014-08-03 NOTE — Telephone Encounter (Signed)
Pt changing to [facility] though he may be continuing his Atipla in the meantime.   He does NOT use the flonase every day so I think should not be an issue since only sporadic use as fare as AI  His lipitor will be elevated as well  Sharyn Lull can you check that it is ok at current dose with cobi.  I also wrote marinol rx for his poor appetite but he will have to pick up that script

## 2014-08-14 ENCOUNTER — Other Ambulatory Visit: Payer: Self-pay | Admitting: *Deleted

## 2014-08-14 DIAGNOSIS — B20 Human immunodeficiency virus [HIV] disease: Secondary | ICD-10-CM

## 2014-08-14 MED ORDER — ELVITEG-COBIC-EMTRICIT-TENOFAF 150-150-200-10 MG PO TABS: 1.0000 | ORAL_TABLET | Freq: Every day | ORAL | Status: DC

## 2014-08-14 MED ORDER — DRONABINOL 5 MG PO CAPS: 5.0000 mg | ORAL_CAPSULE | Freq: Two times a day (BID) | ORAL | Status: DC

## 2014-08-27 ENCOUNTER — Encounter: Payer: Self-pay | Admitting: Infectious Disease

## 2014-08-27 ENCOUNTER — Other Ambulatory Visit: Payer: Self-pay | Admitting: *Deleted

## 2014-08-27 DIAGNOSIS — B20 Human immunodeficiency virus [HIV] disease: Secondary | ICD-10-CM

## 2014-08-27 MED ORDER — EFAVIRENZ-EMTRICITAB-TENOFOVIR 600-200-300 MG PO TABS
1.0000 | ORAL_TABLET | Freq: Every day | ORAL | Status: DC
Start: 2014-08-27 — End: 2014-12-26

## 2014-09-24 ENCOUNTER — Encounter: Payer: Self-pay | Admitting: Internal Medicine

## 2014-09-24 ENCOUNTER — Ambulatory Visit (INDEPENDENT_AMBULATORY_CARE_PROVIDER_SITE_OTHER): Payer: 59 | Admitting: Internal Medicine

## 2014-09-24 VITALS — BP 150/80 | HR 85 | Temp 97.8°F | Wt 191.0 lb

## 2014-09-24 DIAGNOSIS — F39 Unspecified mood [affective] disorder: Secondary | ICD-10-CM | POA: Insufficient documentation

## 2014-09-24 MED ORDER — ALPRAZOLAM 0.25 MG PO TABS: 0.2500 mg | ORAL_TABLET | Freq: Two times a day (BID) | ORAL | Status: DC | PRN

## 2014-09-24 MED ORDER — SERTRALINE HCL 50 MG PO TABS: 50.0000 mg | ORAL_TABLET | Freq: Every day | ORAL | Status: DC

## 2014-09-24 NOTE — Progress Notes (Signed)
Pre visit review using our clinic review tool, if applicable. No additional management support is needed unless otherwise documented below in the visit note. 

## 2014-09-24 NOTE — Patient Instructions (Signed)
Start the sertraline with 1/2 tab for the first 8 days (cut 4 in half). If no problems after the 8 days, increase to a full tab daily

## 2014-09-24 NOTE — Assessment & Plan Note (Signed)
Mostly a secondary process due to wife's needs and worsening condition No panic Not MDD Will start low dose sertraline Emergency supply for alprazolam (he actually broke them in half)

## 2014-09-24 NOTE — Progress Notes (Signed)
   Subjective:    Patient ID: Dale Mcdonald, male    DOB: August 21, 1950, 64 y.o.   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 Then will not sleep---or sleep too much. Good appetite --doesn't overeat. Only occ has comfort food Not anhedonic-- recent trip to beach (but wife called him home early), likes to bake No feelings or worthlessness--but helpless at times No suicidal thoughts or thinking about dying  Did have alprazolam (from Dr Raynelle Bring been using it 3-5 times per week recently Rarely in past He is feeling overwhelmed more lately  Current Outpatient Prescriptions on File Prior to Visit  Medication Sig Dispense Refill  . ALPRAZolam (XANAX) 0.25 MG tablet Take 1 tablet (0.25 mg total) by mouth 2 (two) times daily as needed for anxiety. 30 tablet 1  . amLODipine (NORVASC) 5 MG tablet Take 1 tablet (5 mg total) by mouth daily. 90 tablet 3  . atorvastatin (LIPITOR) 20 MG tablet Take 1 tablet (20 mg total) by mouth daily. 90 tablet 3  . dronabinol (MARINOL) 5 MG capsule Take 1 capsule (5 mg total) by mouth 2 (two) times daily before a meal. 60 capsule 3  . efavirenz-emtricitabine-tenofovir (ATRIPLA) 600-200-300 MG per tablet Take 1 tablet by mouth at bedtime. 90 tablet 1  . lisinopril-hydrochlorothiazide (PRINZIDE,ZESTORETIC) 20-25 MG per tablet Take 1 tablet by mouth daily. 90 tablet 3   No current facility-administered medications on file prior to visit.    No Known Allergies  Past Medical History  Diagnosis Date  . Anxiety   . Hyperlipidemia   . Hypertension   . HIV (human immunodeficiency virus infection)     No  past surgical history on file.  No family history on file.  History   Social History  . Marital Status: Married    Spouse Name: N/A  . Number of Children: 1  . Years of Education: N/A   Occupational History  . [employer]   Social History Main Topics  . Smoking status: Current Every Day Smoker -- 1.50 packs/day    Types: Cigarettes  . Smokeless tobacco: Never Used     Comment: gave 1-800-QUIT-NOW  . Alcohol Use: No  . Drug Use: No  . Sexual Activity: Not on file   Other Topics Concern  . Not on file   Social History Narrative   Review of Systems Legs will move with anxiety at times in bed Weight is stable    Objective:   Physical Exam  Psychiatric:  Normal appearance and speech No thought process disturbances No overt depressed mood and affect is appropriate          Assessment & Plan:

## 2014-11-12 ENCOUNTER — Other Ambulatory Visit: Payer: Self-pay | Admitting: Internal Medicine

## 2014-11-12 NOTE — Telephone Encounter (Signed)
09/24/14 

## 2014-11-12 NOTE — Telephone Encounter (Signed)
rx called into pharmacy

## 2014-11-12 NOTE — Telephone Encounter (Signed)
Approved: okay #[id number] x [phone] 

## 2014-12-05 ENCOUNTER — Encounter (INDEPENDENT_AMBULATORY_CARE_PROVIDER_SITE_OTHER): Payer: Self-pay | Admitting: *Deleted

## 2014-12-05 VITALS — BP 130/69 | HR 80 | Temp 98.5°F | Resp 16 | Wt 192.5 lb

## 2014-12-05 DIAGNOSIS — Z006 Encounter for examination for normal comparison and control in clinical research program: Secondary | ICD-10-CM

## 2014-12-05 LAB — HIV-1 RNA QUANT-NO REFLEX-BLD

## 2014-12-05 NOTE — Progress Notes (Signed)
Dale Mcdonald here today for week 120 A5322 study visit. He denies any new problems except for plantar fasciitis in his rt ft. He is under a great deal of stress with dealing with his wife's health and started zoloft 2 months ago which he says has helped. He will return in November for the next study visit.

## 2014-12-20 ENCOUNTER — Encounter: Payer: Self-pay | Admitting: Infectious Disease

## 2014-12-24 ENCOUNTER — Encounter: Payer: Self-pay | Admitting: Internal Medicine

## 2014-12-24 ENCOUNTER — Ambulatory Visit (INDEPENDENT_AMBULATORY_CARE_PROVIDER_SITE_OTHER): Payer: 59 | Admitting: Internal Medicine

## 2014-12-24 VITALS — BP 140/70 | HR 91 | Temp 98.0°F | Ht 71.0 in | Wt 192.0 lb

## 2014-12-24 DIAGNOSIS — Z125 Encounter for screening for malignant neoplasm of prostate: Secondary | ICD-10-CM | POA: Diagnosis not present

## 2014-12-24 DIAGNOSIS — L570 Actinic keratosis: Secondary | ICD-10-CM

## 2014-12-24 DIAGNOSIS — F39 Unspecified mood [affective] disorder: Secondary | ICD-10-CM

## 2014-12-24 DIAGNOSIS — B20 Human immunodeficiency virus [HIV] disease: Secondary | ICD-10-CM

## 2014-12-24 DIAGNOSIS — Z0000 Encounter for general adult medical examination without abnormal findings: Secondary | ICD-10-CM | POA: Diagnosis not present

## 2014-12-24 DIAGNOSIS — I10 Essential (primary) hypertension: Secondary | ICD-10-CM | POA: Diagnosis not present

## 2014-12-24 DIAGNOSIS — E785 Hyperlipidemia, unspecified: Secondary | ICD-10-CM

## 2014-12-24 LAB — COMPREHENSIVE METABOLIC PANEL
ALT: 35 U/L (ref 0–53)
AST: 28 U/L (ref 0–37)
Albumin: 4.3 g/dL (ref 3.5–5.2)
Alkaline Phosphatase: 95 U/L (ref 39–117)
BUN: 12 mg/dL (ref 6–23)
CALCIUM: 9.7 mg/dL (ref 8.4–10.5)
CO2: 29 mEq/L (ref 19–32)
Chloride: 101 mEq/L (ref 96–112)
Creatinine, Ser: 0.98 mg/dL (ref 0.40–1.50)
GFR: 81.99 mL/min (ref >60.00)
Glucose, Bld: 70 mg/dL (ref 70–99)
Potassium: 3.8 mEq/L (ref 3.5–5.1)
Sodium: 139 mEq/L (ref 135–145)
Total Bilirubin: 0.3 mg/dL (ref 0.2–1.2)
Total Protein: 7 g/dL (ref 6.0–8.3)

## 2014-12-24 LAB — CBC WITH DIFFERENTIAL/PLATELET
BASOS ABS: 0 10*3/uL (ref 0.0–0.1)
Basophils Relative: 0.5 % (ref 0.0–3.0)
Eosinophils Absolute: 0.2 10*3/uL (ref 0.0–0.7)
Eosinophils Relative: 2.2 % (ref 0.0–5.0)
HEMATOCRIT: 42.6 % (ref 39.0–52.0)
Hemoglobin: 14.5 g/dL (ref 13.0–17.0)
Lymphocytes Relative: 31.7 % (ref 12.0–46.0)
Lymphs Abs: 2.7 10*3/uL (ref 0.7–4.0)
MCHC: 34.1 g/dL (ref 30.0–36.0)
MCV: 97 fl (ref 78.0–100.0)
MONO ABS: 0.8 10*3/uL (ref 0.1–1.0)
Monocytes Relative: 9 % (ref 3.0–12.0)
Neutro Abs: 4.8 10*3/uL (ref 1.4–7.7)
Neutrophils Relative %: 56.6 % (ref 43.0–77.0)
Platelets: 247 10*3/uL (ref 150.0–400.0)
RBC: 4.39 Mil/uL (ref 4.22–5.81)
RDW: 13.5 % (ref 11.5–15.5)
WBC: 8.4 10*3/uL (ref 4.0–10.5)

## 2014-12-24 LAB — T4, FREE: FREE T4: 0.77 ng/dL (ref 0.60–1.60)

## 2014-12-24 LAB — LIPID PANEL
CHOL/HDL RATIO: 4
CHOLESTEROL: 160 mg/dL (ref 0–200)
HDL: 44.5 mg/dL (ref >39.00)
LDL Cholesterol: 77 mg/dL (ref 0–99)
NonHDL: 115.79
Triglycerides: 194 mg/dL — ABNORMAL HIGH (ref 0.0–149.0)
VLDL: 38.8 mg/dL (ref 0.0–40.0)

## 2014-12-24 LAB — PSA: PSA: 0.51 ng/mL (ref 0.10–4.00)

## 2014-12-24 MED ORDER — SERTRALINE HCL 50 MG PO TABS: 50.0000 mg | ORAL_TABLET | Freq: Every day | ORAL | Status: DC

## 2014-12-24 MED ORDER — ALPRAZOLAM 0.25 MG PO TABS: 0.2500 mg | ORAL_TABLET | Freq: Two times a day (BID) | ORAL | Status: DC | PRN

## 2014-12-24 MED ORDER — AMLODIPINE BESYLATE 5 MG PO TABS: 5.0000 mg | ORAL_TABLET | Freq: Every day | ORAL | Status: DC

## 2014-12-24 MED ORDER — LISINOPRIL-HYDROCHLOROTHIAZIDE 20-25 MG PO TABS: 1.0000 | ORAL_TABLET | Freq: Every day | ORAL | Status: DC

## 2014-12-24 NOTE — Assessment & Plan Note (Signed)
Liquid nitrogen 30 seconds x 2 Wart on hand treated also Discussed home care

## 2014-12-24 NOTE — Assessment & Plan Note (Signed)
BP Readings from Last 3 Encounters:  12/24/14 140/70  12/05/14 130/69  09/24/14 150/80   Good control No changes needed

## 2014-12-24 NOTE — Progress Notes (Signed)
Subjective:    Patient ID: Dale Mcdonald, male    DOB: 03-03-1951, 64 y.o.   MRN: [id number]  HPI Here for physical  Ongoing stress with wife's health She died 3 weeks ago "I'm just lost" He feels the sertraline has helped Still very limited sleep Tried 1/2 of wife's clonazepam--and then a whole one last night. This did help but he feels it today  HIV is still under control Tolerating medications fine May be changing meds---awaiting insurance approval  Still smoking "chain smoking" the last few weeks Discussed--- hopefully he can reconsider stopping as things calm down  No problems with statin or BP med  Current Outpatient Prescriptions on File Prior to Visit  Medication Sig Dispense Refill  . ALPRAZolam (XANAX) 0.25 MG tablet TAKE 1 TABLET BY MOUTH TWICE A DAY AS NEEDED ANXIETY 30 tablet 0  . amLODipine (NORVASC) 5 MG tablet Take 1 tablet (5 mg total) by mouth daily. 90 tablet 3  . atorvastatin (LIPITOR) 20 MG tablet Take 1 tablet (20 mg total) by mouth daily. 90 tablet 3  . efavirenz-emtricitabine-tenofovir (ATRIPLA) 600-200-300 MG per tablet Take 1 tablet by mouth at bedtime. 90 tablet 1  . lisinopril-hydrochlorothiazide (PRINZIDE,ZESTORETIC) 20-25 MG per tablet Take 1 tablet by mouth daily. 90 tablet 3  . sertraline (ZOLOFT) 50 MG tablet Take 1 tablet (50 mg total) by mouth daily. 30 tablet 3   No current facility-administered medications on file prior to visit.    No Known Allergies  Past Medical History  Diagnosis Date  . Anxiety   . Hyperlipidemia   . Hypertension   . HIV (human immunodeficiency virus infection)     No past surgical history on file.  No family history on file.  History   Social History  . Marital Status: Widowed    Spouse Name: N/A  . Number of Children: 1  . Years of Education: N/A   Occupational History  . [employer]   Social History Main Topics  . Smoking status:  Current Every Day Smoker -- 1.50 packs/day    Types: Cigarettes  . Smokeless tobacco: Never Used     Comment: gave 1-800-QUIT-NOW  . Alcohol Use: No  . Drug Use: No  . Sexual Activity: Not on file   Other Topics Concern  . Not on file   Social History Narrative   Review of Systems  Constitutional: Positive for unexpected weight change. Negative for fatigue.       Weight up just 5# since retirement Wears seat belt  HENT: Negative for dental problem, hearing loss and tinnitus.        Keeps up with dentist  Eyes: Negative for visual disturbance.       No diplopia or unilateral vision loss  Respiratory: Positive for cough. Negative for chest tightness and shortness of breath.        Some smoker's cough  Cardiovascular: Negative for chest pain, palpitations and leg swelling.  Gastrointestinal: Negative for nausea, vomiting, abdominal pain, constipation and blood in stool.  Endocrine: Negative for polydipsia and polyuria.  Genitourinary: Negative for urgency, frequency and difficulty urinating.  Musculoskeletal: Negative for back pain, joint swelling and arthralgias.       Right heel pain-- ?plantar fasciitis  Skin:       Has a couple of spots to be checked--- right ear and right palm  Allergic/Immunologic: Positive for environmental allergies. Negative for immunocompromised state.       Has  used flonase seasonally  Neurological: Positive for headaches. Negative for dizziness, syncope, weakness and light-headedness.       Late afternoon headaches in last few weeks--excedrin relieves  Hematological: Negative for adenopathy. Bruises/bleeds easily.  Psychiatric/Behavioral: Positive for sleep disturbance and dysphoric mood. The patient is nervous/anxious.        Objective:   Physical Exam  Constitutional: He is oriented to person, place, and time. He appears well-developed and well-nourished. No distress.  HENT:  Head: Normocephalic and atraumatic.  Right Ear: External ear normal.    Left Ear: External ear normal.  Mouth/Throat: Oropharynx is clear and moist. No oropharyngeal exudate.  Eyes: Conjunctivae and EOM are normal. Pupils are equal, round, and reactive to light.  Neck: Normal range of motion. Neck supple. No thyromegaly present.  Cardiovascular: Normal rate, regular rhythm, normal heart sounds and intact distal pulses.  Exam reveals no gallop.   No murmur heard. Pulmonary/Chest: Effort normal and breath sounds normal. No respiratory distress. He has no wheezes. He has no rales.  Abdominal: Soft. There is no tenderness.  Musculoskeletal: He exhibits no edema or tenderness.  Lymphadenopathy:    He has no cervical adenopathy.  Neurological: He is alert and oriented to person, place, and time.  Skin: No rash noted.  Early actinic inside right pinna Small wart on right palm  Psychiatric: He has a normal mood and affect. His behavior is normal.          Assessment & Plan:

## 2014-12-24 NOTE — Progress Notes (Signed)
Pre visit review using our clinic review tool, if applicable. No additional management support is needed unless otherwise documented below in the visit note. 

## 2014-12-24 NOTE — Assessment & Plan Note (Signed)
Discussed PSA---will recheck Colonoscopy due 2018 Yearly flu vaccine

## 2014-12-24 NOTE — Assessment & Plan Note (Signed)
Now with grieving also Has been helped by the setraline so will continue and reevaluate next year

## 2014-12-24 NOTE — Assessment & Plan Note (Signed)
Still with undetectable titer Dr Drucilla Schmidt follows

## 2014-12-24 NOTE — Assessment & Plan Note (Signed)
Doing fine with primary prevention 

## 2014-12-25 ENCOUNTER — Encounter: Payer: Self-pay | Admitting: Infectious Disease

## 2014-12-26 ENCOUNTER — Other Ambulatory Visit: Payer: Self-pay | Admitting: *Deleted

## 2014-12-26 DIAGNOSIS — B20 Human immunodeficiency virus [HIV] disease: Secondary | ICD-10-CM

## 2014-12-26 MED ORDER — EFAVIRENZ-EMTRICITAB-TENOFOVIR 600-200-300 MG PO TABS: 1.0000 | ORAL_TABLET | Freq: Every day | ORAL | Status: DC

## 2015-01-07 ENCOUNTER — Encounter: Payer: Self-pay | Admitting: Infectious Disease

## 2015-02-10 ENCOUNTER — Encounter: Payer: Self-pay | Admitting: Internal Medicine

## 2015-02-13 ENCOUNTER — Encounter: Payer: Self-pay | Admitting: Family Medicine

## 2015-02-13 ENCOUNTER — Ambulatory Visit (INDEPENDENT_AMBULATORY_CARE_PROVIDER_SITE_OTHER): Payer: 59 | Admitting: Family Medicine

## 2015-02-13 VITALS — BP 130/72 | HR 87 | Temp 98.2°F | Ht 71.0 in | Wt 193.2 lb

## 2015-02-13 DIAGNOSIS — M216X1 Other acquired deformities of right foot: Secondary | ICD-10-CM | POA: Diagnosis not present

## 2015-02-13 DIAGNOSIS — M722 Plantar fascial fibromatosis: Secondary | ICD-10-CM | POA: Diagnosis not present

## 2015-02-13 DIAGNOSIS — Q6671 Congenital pes cavus, right foot: Secondary | ICD-10-CM

## 2015-02-13 NOTE — Progress Notes (Signed)
Pre visit review using our clinic review tool, if applicable. No additional management support is needed unless otherwise documented below in the visit note. 

## 2015-02-13 NOTE — Patient Instructions (Signed)
Please read handouts on Plantar Fascitis.  STRETCHING and Strengthening program critically important.  Strengthening on foot and calf muscles as seen in handout. Calf raises, 2 legged, then 1 legged. Foot massage with tennis ball. Ice massage.  Towel Scrunches: get a towel or hand towel, use toes to pick up and scrunch up the towel.  Marble pick-ups, practice picking up marbles with toes and placing into a cup  NEEDS TO BE DONE EVERY DAY  No easily bendable shoes.   Tuli's heel cups   Excellent Over the Counter Orthotics:  Hapad: available at www.hapad.com (Green Sports Insoles) SPENCO: Available at some sports stores or MagazineAlert.pl. (I prefer the full-length green orthotic that has a yellow bottom / base)  www.pedifix.com and "[place]" website: multiple good foot products  SHOES: Danskos, Merrells, Keens, Clarks, Birkenstocks - good arch support, want minimal bendability. Finn Comfort also excellent, but even more expensive.  Tennis Shoes / Running Shoes: Many good companies and styles. The most important thing is to get a good fit and wear a shoe that feels good in the store. Walk around in the store. Run if that is something that you can do. Some of the running stores have a treadmill, also.  Brooks, New Balance, Karleen Hampshire are good, but the most important thing is to wear a shoe that fits your foot and feels good.  [employer] in [place]: excellent staff, IT consultant (Running and [employer]), OTC orthotics. On [employer] across from the [employer]. For running shoe and athletic shoe fit, they are the best.  Nicer shoes:  Birkenstock shoes, [place]: Excellent selection  THE SHOE MARKET, [place] [place], [place]: They have the largest selection of comfort and supportive shoes that I have ever seen, and their staff is generally quite good in fitting you for shoes. (They will intermittently have sales, mail coupons, and you can  look on their website.)  [employer]: [address] [place], [place]

## 2015-02-13 NOTE — Progress Notes (Signed)
Dr. Frederico Hamman T. Bryker Fletchall, MD, [facility] [place], [address] Phone: [phone] Fax: [phone]  02/13/2015  Patient: Dale Mcdonald, MRN: [id number], DOB: [birth year], 64 y.o.  Primary Physician:  [name], MD  Chief Complaint: Foot Pain  Subjective:   This 64 y.o. male patient presents with a 6 month long history of heel pain. This is notable for worsening pain first thing in the morning when arising and standing after sitting.   Lost his wife 2 months ago, and wife died about 2 months and then up and down with wife 3/4 - 2016.  Since then has been bothering him a lot.  PF never did go way.  Wondering exactly what is the exact issue.   Prior foot or ankle fractures: none Prior operations: none Orthotics or bracing: OTC Dr. Felicie Morn semi-custom orthotics Medications: has tried tylenol and nsaids PT or home rehab: some initially Night splints: no Ice massage: no Ball massage: no  Metatarsal pain: no  The PMH, PSH, Social History, Family History, Medications, and allergies have been reviewed in [facility], and have been updated if relevant.  Patient Active Problem List   Diagnosis Date Noted  . Episodic mood disorder 09/24/2014  . Routine general medical examination at a health care facility 07/25/2012  . SMOKER 06/04/2010  . Insomnia 02/04/2009  . HEPATITIS B, CHRONIC 12/03/2008  . Human immunodeficiency virus (HIV) disease 11/21/2008  . Hyperlipemia 10/28/2007  . Essential hypertension 10/28/2007  . ALLERGIC RHINITIS 10/28/2007  . ACTINIC KERATOSIS 10/28/2007    Past Medical History  Diagnosis Date  . Anxiety   . Hyperlipidemia   . Hypertension   . HIV (human immunodeficiency virus infection)     No past surgical history on file.  Social History   Social History  . Marital Status: Widowed    Spouse Name: N/A  . Number of Children: 1  . Years of Education: N/A   Occupational History  . [employer]   Social History Main Topics  . Smoking status: Current Every Day Smoker -- 1.50 packs/day    Types: Cigarettes  . Smokeless tobacco: Never Used     Comment: gave 1-800-QUIT-NOW  . Alcohol Use: No  . Drug Use: No  . Sexual Activity: Not on file   Other Topics Concern  . Not on file   Social History Narrative    No family history on file.  No Known Allergies  Medication list reviewed and updated in full in [place].  GEN: No fevers, chills. Nontoxic. Primarily MSK c/o today. MSK: Detailed in the HPI GI: tolerating PO intake without difficulty Neuro: No numbness, parasthesias, or tingling associated. Otherwise the pertinent positives of the ROS are noted above.   Objective:   Blood pressure 130/72, pulse 87, temperature 98.2 F (36.8 C), temperature source Oral, height 5\' 11"  (1.803 m), weight 193 lb 4 oz (87.658 kg).  GEN: Well-developed,well-nourished,in no acute distress; alert,appropriate and cooperative throughout examination HEENT: Normocephalic and atraumatic without obvious abnormalities. Ears, externally no deformities PULM: Breathing comfortably in no respiratory distress EXT: No clubbing, cyanosis, or edema PSYCH: Normally interactive. Cooperative during the interview. Pleasant. Friendly and conversant. Not anxious or depressed appearing. Normal, full affect.   B feet and ankles Echymosis: no Edema: no ROM: full LE B Gait: heel toe, non-antalgic MT pain: no Callus pattern: none Lateral Mall: NT Medial Mall: NT Talus: NT Navicular: NT  Calcaneous: NT Metatarsals: NT 5th MT: NT Phalanges: NT Achilles: NT Plantar Fascia: tender, medial along PF. Pain with forced dorsi Fat Pad: NT Peroneals: NT Post Tib: NT Great Toe: Nml motion Ant Drawer: neg Other foot breakdown: none Long arch: preserved, high cavus arch Transverse arch: moderate B breakdown Hindfoot breakdown:  none Sensation: intact  Assessment and Plan:   Plantar fasciitis, right  Pes cavus of right foot  6 months of sx not unusual. Will need to make footwear changes, dedicated rehab.  Anatomy reviewed. Stretching and rehab are critically important to the treatment of PF. Reviewed footwear. Rigid soles have been shown to help with PF. Reviewed rehab of stretching and calf raises.  Reviewed rehab from [facility]  Patient Instructions  Please read handouts on Plantar Fascitis.  STRETCHING and Strengthening program critically important.  Strengthening on foot and calf muscles as seen in handout. Calf raises, 2 legged, then 1 legged. Foot massage with tennis ball. Ice massage.  Towel Scrunches: get a towel or hand towel, use toes to pick up and scrunch up the towel.  Marble pick-ups, practice picking up marbles with toes and placing into a cup  NEEDS TO BE DONE EVERY DAY  No easily bendable shoes.   Tuli's heel cups   Excellent Over the Counter Orthotics:  Hapad: available at www.hapad.com (Green Sports Insoles) SPENCO: Available at some sports stores or MagazineAlert.pl. (I prefer the full-length green orthotic that has a yellow bottom / base)  www.pedifix.com and "[place]" website: multiple good foot products  SHOES: Danskos, Merrells, Keens, Clarks, Birkenstocks - good arch support, want minimal bendability. Finn Comfort also excellent, but even more expensive.  Tennis Shoes / Running Shoes: Many good companies and styles. The most important thing is to get a good fit and wear a shoe that feels good in the store. Walk around in the store. Run if that is something that you can do. Some of the running stores have a treadmill, also.  Brooks, New Balance, Karleen Hampshire are good, but the most important thing is to wear a shoe that fits your foot and feels good.  [employer] in [place]: excellent staff, IT consultant (Running and  [employer]), OTC orthotics. On [employer] across from the [employer]. For running shoe and athletic shoe fit, they are the best.  Nicer shoes:  Birkenstock shoes, [place]: Excellent selection  THE SHOE MARKET, [place] [place], [place]: They have the largest selection of comfort and supportive shoes that I have ever seen, and their staff is generally quite good in fitting you for shoes. (They will intermittently have sales, mail coupons, and you can look on their website.)  [employer]: [address] [place], [place]        Follow-up: prn  Signed,  Maud Deed. Konnor Jorden, MD   Patient's Medications  New Prescriptions   No medications on file  Previous Medications   ALPRAZOLAM (XANAX) 0.25 MG TABLET    Take 1 tablet (0.25 mg total) by mouth 2 (two) times daily as needed for anxiety.   AMLODIPINE (NORVASC) 5 MG TABLET    Take 1 tablet (5 mg total) by mouth daily.   ATORVASTATIN (LIPITOR) 20 MG TABLET    Take 1 tablet (20 mg total) by mouth daily.   EFAVIRENZ-EMTRICITABINE-TENOFOVIR (ATRIPLA) 600-200-300 MG PER TABLET    Take 1 tablet by mouth at bedtime.   LISINOPRIL-HYDROCHLOROTHIAZIDE (PRINZIDE,ZESTORETIC) 20-25 MG PER TABLET  Take 1 tablet by mouth daily.  Modified Medications   No medications on file  Discontinued Medications   SERTRALINE (ZOLOFT) 50 MG TABLET    Take 1 tablet (50 mg total) by mouth daily.

## 2015-04-01 ENCOUNTER — Encounter (INDEPENDENT_AMBULATORY_CARE_PROVIDER_SITE_OTHER): Payer: Self-pay | Admitting: *Deleted

## 2015-04-01 VITALS — BP 168/80 | HR 92 | Temp 97.8°F | Resp 16 | Ht 70.0 in | Wt 193.5 lb

## 2015-04-01 DIAGNOSIS — Z006 Encounter for examination for normal comparison and control in clinical research program: Secondary | ICD-10-CM

## 2015-04-01 LAB — COMPREHENSIVE METABOLIC PANEL
ALBUMIN: 4.4 g/dL (ref 3.6–5.1)
ALT: 39 U/L (ref 9–46)
AST: 30 U/L (ref 10–35)
Alkaline Phosphatase: 111 U/L (ref 40–115)
BUN: 9 mg/dL (ref 7–25)
CALCIUM: 9.4 mg/dL (ref 8.6–10.3)
CHLORIDE: 98 mmol/L (ref 98–110)
CO2: 28 mmol/L (ref 20–31)
Creat: 0.81 mg/dL (ref 0.70–1.25)
Glucose, Bld: 92 mg/dL (ref 65–99)
Potassium: 4 mmol/L (ref 3.5–5.3)
Sodium: 138 mmol/L (ref 135–146)
Total Bilirubin: 0.5 mg/dL (ref 0.2–1.2)
Total Protein: 7.2 g/dL (ref 6.1–8.1)

## 2015-04-01 LAB — LIPID PANEL
Cholesterol: 157 mg/dL (ref 125–200)
HDL: 48 mg/dL (ref >=40)
LDL CALC: 80 mg/dL (ref <130)
TRIGLYCERIDES: 147 mg/dL (ref <150)
Total CHOL/HDL Ratio: 3.3 Ratio (ref <=5.0)
VLDL: 29 mg/dL (ref <30)

## 2015-04-01 LAB — CD4/CD8 (T-HELPER/T-SUPPRESSOR CELL)
CD4%: 39.3
CD4: 629
CD8 % Suppressor T Cell: 38
CD8: 608

## 2015-04-01 LAB — HIV-1 RNA QUANT-NO REFLEX-BLD

## 2015-04-01 LAB — HEMOGLOBIN A1C
HEMOGLOBIN A1C: 6.4 % — AB (ref <5.7)
MEAN PLASMA GLUCOSE: 137 mg/dL — AB (ref <117)

## 2015-04-01 NOTE — Progress Notes (Unsigned)
Tor here for A5322 (HAILO Study) wk 144 visit. No new complaints/problems verbalized.States that he stopped his Zoloft about 3wks ago. States that he felt is was causing "increase confusion". Continues to work on coping with the recent loss of his wife.

## 2015-04-02 LAB — CREATININE, URINE, RANDOM: Creatinine, Urine: 24 mg/dL (ref 20–370)

## 2015-04-02 LAB — PROTEIN, URINE, RANDOM: Total Protein, Urine: 6 mg/dL (ref 5–25)

## 2015-05-01 ENCOUNTER — Encounter: Payer: Self-pay | Admitting: Infectious Disease

## 2015-05-07 ENCOUNTER — Other Ambulatory Visit: Payer: Self-pay | Admitting: *Deleted

## 2015-05-07 ENCOUNTER — Encounter: Payer: Self-pay | Admitting: Internal Medicine

## 2015-05-07 ENCOUNTER — Encounter: Payer: Self-pay | Admitting: Infectious Disease

## 2015-05-07 DIAGNOSIS — B20 Human immunodeficiency virus [HIV] disease: Secondary | ICD-10-CM

## 2015-05-07 MED ORDER — AMLODIPINE BESYLATE 5 MG PO TABS: 5.0000 mg | ORAL_TABLET | Freq: Every day | ORAL | Status: DC

## 2015-05-07 MED ORDER — EFAVIRENZ-EMTRICITAB-TENOFOVIR 600-200-300 MG PO TABS: 1.0000 | ORAL_TABLET | Freq: Every day | ORAL | Status: DC

## 2015-06-19 ENCOUNTER — Encounter: Payer: Self-pay | Admitting: Infectious Disease

## 2015-06-20 ENCOUNTER — Other Ambulatory Visit: Payer: Self-pay | Admitting: *Deleted

## 2015-06-20 DIAGNOSIS — B20 Human immunodeficiency virus [HIV] disease: Secondary | ICD-10-CM

## 2015-06-20 MED ORDER — EFAVIRENZ-EMTRICITAB-TENOFOVIR 600-200-300 MG PO TABS: 1.0000 | ORAL_TABLET | Freq: Every day | ORAL | Status: DC

## 2015-10-07 ENCOUNTER — Encounter (INDEPENDENT_AMBULATORY_CARE_PROVIDER_SITE_OTHER): Payer: 59 | Admitting: *Deleted

## 2015-10-07 VITALS — BP 136/83 | HR 76 | Temp 98.4°F | Resp 16 | Wt 194.0 lb

## 2015-10-07 DIAGNOSIS — Z006 Encounter for examination for normal comparison and control in clinical research program: Secondary | ICD-10-CM

## 2015-10-07 LAB — HIV-1 RNA QUANT-NO REFLEX-BLD

## 2015-10-07 NOTE — Progress Notes (Signed)
Dale Mcdonald is here for his week 168 visit for The HAILO Study: A Long Term follow-up of Older HIV-Infected Adults in the ACTG, an observational study addressing the issues of aging, HIV infection and Inflammation. He denies any new problems or medications. He has been traveling and enjoying his retirement. He will return in October for the next visit and will schedule a followup appt. For Dr. Tommy Medal soon.

## 2015-10-25 ENCOUNTER — Encounter: Payer: Self-pay | Admitting: Infectious Disease

## 2015-12-04 ENCOUNTER — Other Ambulatory Visit: Payer: Self-pay | Admitting: *Deleted

## 2015-12-04 ENCOUNTER — Encounter: Payer: Self-pay | Admitting: Internal Medicine

## 2015-12-04 ENCOUNTER — Other Ambulatory Visit: Payer: Self-pay | Admitting: Internal Medicine

## 2015-12-04 ENCOUNTER — Encounter: Payer: Self-pay | Admitting: *Deleted

## 2015-12-04 DIAGNOSIS — B20 Human immunodeficiency virus [HIV] disease: Secondary | ICD-10-CM

## 2015-12-04 MED ORDER — EFAVIRENZ-EMTRICITAB-TENOFOVIR 600-200-300 MG PO TABS: 1.0000 | ORAL_TABLET | Freq: Every day | ORAL | Status: DC

## 2015-12-04 NOTE — Telephone Encounter (Signed)
Requested via MyChart for the patient to make an appointment with Dr. Tommy Medal.  Last appt was 08/01/17.

## 2015-12-05 ENCOUNTER — Encounter: Payer: Self-pay | Admitting: Infectious Disease

## 2015-12-05 ENCOUNTER — Telehealth: Payer: Self-pay | Admitting: *Deleted

## 2015-12-05 NOTE — Telephone Encounter (Signed)
That is fine. I can also find ways to minimize labs. We dont have to do CBC for example or CD4 counts I do need viral loads, and STI testing along with cretatinine and LFTS,

## 2015-12-05 NOTE — Telephone Encounter (Signed)
Spoke with patient. He would like to streamline his lab appointments - and minimize sticks! He is due for annual bloodwork with his PCP 12/24/15.  He is also due for extensive bloodwork with [facility] in November 2017 ("19 vials" per patient).  Last viral load was [facility], <40 May 2017.  [facility] CD4 counts have been stable for years.  If all parties agree, patient would like to let the already-scheduled lab visits suffice for any bloodwork that Dr. Tommy Medal would need.  If there is something not collected by his PCP or scheduled to be collected via [facility], he would like to have it drawn at his appointment. Please advise if this plan is appropriate. Dale Gandy, RN

## 2015-12-12 ENCOUNTER — Other Ambulatory Visit: Payer: Self-pay | Admitting: *Deleted

## 2015-12-12 DIAGNOSIS — B20 Human immunodeficiency virus [HIV] disease: Secondary | ICD-10-CM

## 2015-12-12 MED ORDER — EFAVIRENZ-EMTRICITAB-TENOFOVIR 600-200-300 MG PO TABS: 1.0000 | ORAL_TABLET | Freq: Every day | ORAL | Status: DC

## 2015-12-25 ENCOUNTER — Encounter: Payer: Self-pay | Admitting: Internal Medicine

## 2015-12-25 ENCOUNTER — Ambulatory Visit (INDEPENDENT_AMBULATORY_CARE_PROVIDER_SITE_OTHER): Payer: BLUE CROSS/BLUE SHIELD | Admitting: Internal Medicine

## 2015-12-25 VITALS — BP 124/84 | HR 86 | Temp 98.4°F | Ht 70.25 in | Wt 192.0 lb

## 2015-12-25 DIAGNOSIS — B20 Human immunodeficiency virus [HIV] disease: Secondary | ICD-10-CM

## 2015-12-25 DIAGNOSIS — I10 Essential (primary) hypertension: Secondary | ICD-10-CM | POA: Diagnosis not present

## 2015-12-25 DIAGNOSIS — Z23 Encounter for immunization: Secondary | ICD-10-CM | POA: Diagnosis not present

## 2015-12-25 DIAGNOSIS — E785 Hyperlipidemia, unspecified: Secondary | ICD-10-CM

## 2015-12-25 DIAGNOSIS — Z0000 Encounter for general adult medical examination without abnormal findings: Secondary | ICD-10-CM

## 2015-12-25 LAB — CBC WITH DIFFERENTIAL/PLATELET
BASOS PCT: 0.5 % (ref 0.0–3.0)
Basophils Absolute: 0 10*3/uL (ref 0.0–0.1)
EOS ABS: 0.1 10*3/uL (ref 0.0–0.7)
EOS PCT: 1.9 % (ref 0.0–5.0)
HCT: 46.7 % (ref 39.0–52.0)
HEMOGLOBIN: 15.9 g/dL (ref 13.0–17.0)
LYMPHS ABS: 2.7 10*3/uL (ref 0.7–4.0)
Lymphocytes Relative: 36 % (ref 12.0–46.0)
MCHC: 34.1 g/dL (ref 30.0–36.0)
MCV: 94.7 fl (ref 78.0–100.0)
MONO ABS: 0.6 10*3/uL (ref 0.1–1.0)
Monocytes Relative: 8 % (ref 3.0–12.0)
NEUTROS ABS: 4.1 10*3/uL (ref 1.4–7.7)
Neutrophils Relative %: 53.6 % (ref 43.0–77.0)
PLATELETS: 257 10*3/uL (ref 150.0–400.0)
RBC: 4.93 Mil/uL (ref 4.22–5.81)
RDW: 13.5 % (ref 11.5–15.5)
WBC: 7.6 10*3/uL (ref 4.0–10.5)

## 2015-12-25 LAB — LIPID PANEL
CHOLESTEROL: 164 mg/dL (ref 0–200)
HDL: 47.8 mg/dL (ref >39.00)
LDL CALC: 87 mg/dL (ref 0–99)
NonHDL: 116.44
TRIGLYCERIDES: 147 mg/dL (ref 0.0–149.0)
Total CHOL/HDL Ratio: 3
VLDL: 29.4 mg/dL (ref 0.0–40.0)

## 2015-12-25 LAB — COMPREHENSIVE METABOLIC PANEL
ALK PHOS: 102 U/L (ref 39–117)
ALT: 30 U/L (ref 0–53)
AST: 24 U/L (ref 0–37)
Albumin: 4.5 g/dL (ref 3.5–5.2)
BUN: 12 mg/dL (ref 6–23)
CHLORIDE: 99 meq/L (ref 96–112)
CO2: 29 mEq/L (ref 19–32)
Calcium: 10.2 mg/dL (ref 8.4–10.5)
Creatinine, Ser: 1.03 mg/dL (ref 0.40–1.50)
GFR: 77.17 mL/min (ref >60.00)
GLUCOSE: 119 mg/dL — AB (ref 70–99)
POTASSIUM: 4 meq/L (ref 3.5–5.1)
SODIUM: 137 meq/L (ref 135–145)
TOTAL PROTEIN: 8.1 g/dL (ref 6.0–8.3)
Total Bilirubin: 0.4 mg/dL (ref 0.2–1.2)

## 2015-12-25 LAB — T4, FREE: FREE T4: 0.68 ng/dL (ref 0.60–1.60)

## 2015-12-25 MED ORDER — VARENICLINE TARTRATE 1 MG PO TABS: 1.0000 mg | ORAL_TABLET | Freq: Two times a day (BID) | ORAL | 5 refills | Status: DC

## 2015-12-25 MED ORDER — VARENICLINE TARTRATE 0.5 MG X 11 & 1 MG X 42 PO MISC: ORAL | 0 refills | Status: DC

## 2015-12-25 MED ORDER — ATORVASTATIN CALCIUM 20 MG PO TABS: 20.0000 mg | ORAL_TABLET | Freq: Every day | ORAL | 3 refills | Status: DC

## 2015-12-25 NOTE — Progress Notes (Signed)
Subjective:    Patient ID: Dale Mcdonald, male    DOB: 19-Apr-1951, 65 y.o.   MRN: [id number]  HPI Here for physical  Doing okay Feels like he is moving through the grieving process Stopped the sertraline--didn't seem to help Uses the alprazolam as needed---rare (for high anxiety day)  Doing well with HIV treatment In study Regular follow up with Dr Drucilla Schmidt HIV still undetectable  Interested in trying chantix again It helped but then he relapsed after stressor event He is ready to stop Has also used wellbutrin--helps but can't sleep  Feels he doesn't have energy Smoking more--so wants to stop Not exercising lately-- discussed restarting  Current Outpatient Prescriptions on File Prior to Visit  Medication Sig Dispense Refill  . ALPRAZolam (XANAX) 0.25 MG tablet Take 1 tablet (0.25 mg total) by mouth 2 (two) times daily as needed for anxiety. 30 tablet 0  . amLODipine (NORVASC) 5 MG tablet Take 1 tablet (5 mg total) by mouth daily. 90 tablet 3  . efavirenz-emtricitabine-tenofovir (ATRIPLA) 600-200-300 MG tablet Take 1 tablet by mouth at bedtime. 30 tablet 0  . lisinopril-hydrochlorothiazide (PRINZIDE,ZESTORETIC) 20-25 MG tablet TAKE 1 TABLET BY MOUTH EVERY DAY 90 tablet 0   No current facility-administered medications on file prior to visit.     No Known Allergies  Past Medical History:  Diagnosis Date  . Anxiety   . HIV (human immunodeficiency virus infection) ([place])   . Hyperlipidemia   . Hypertension     No past surgical history on file.  No family history on file.  Social History   Social History  . Marital status: Widowed    Spouse name: N/A  . Number of children: 1  . Years of education: N/A   Occupational History  . [employer]   Social History Main Topics  . Smoking status: Current Every Day Smoker    Packs/day: 1.50    Types: Cigarettes  . Smokeless tobacco: Never Used     Comment: gave  1-800-QUIT-NOW  . Alcohol use No  . Drug use: No  . Sexual activity: Not on file   Other Topics Concern  . Not on file   Social History Narrative  . No narrative on file   Review of Systems  Constitutional: Positive for fatigue. Negative for unexpected weight change.       Wears seat belt  HENT: Negative for dental problem, hearing loss and tinnitus.        Keeps up with dentist  Eyes: Negative for visual disturbance.       No diplopia or unilateral vision loss  Respiratory: Positive for cough. Negative for chest tightness and shortness of breath.        AM smoker's cough only  Cardiovascular: Negative for chest pain, palpitations and leg swelling.  Gastrointestinal: Negative for abdominal pain, blood in stool, constipation, nausea and vomiting.       No heartburn  Endocrine: Negative for polydipsia and polyuria.  Genitourinary: Negative for difficulty urinating, dysuria, frequency and urgency.       Discussed safe sex  Musculoskeletal: Negative for arthralgias, back pain and joint swelling.  Skin: Negative for rash.       Has some spots to be checked---on right arm (were not actinics)  Allergic/Immunologic: Positive for environmental allergies.       Seasonal and mold sensitive  Neurological: Negative for dizziness, syncope, weakness, light-headedness and headaches.  Hematological: Negative for adenopathy. Bruises/bleeds easily.  Psychiatric/Behavioral:  Positive for dysphoric mood. Negative for sleep disturbance. The patient is nervous/anxious.        Objective:   Physical Exam  Constitutional: He is oriented to person, place, and time. He appears well-developed and well-nourished. No distress.  HENT:  Head: Normocephalic and atraumatic.  Right Ear: External ear normal.  Left Ear: External ear normal.  Mouth/Throat: Oropharynx is clear and moist. No oropharyngeal exudate.  Eyes: Conjunctivae are normal. Pupils are equal, round, and reactive to light.  Neck: Normal range  of motion. Neck supple. No thyromegaly present.  Cardiovascular: Normal rate, regular rhythm, normal heart sounds and intact distal pulses.  Exam reveals no gallop.   No murmur heard. Pulmonary/Chest: Effort normal and breath sounds normal. No respiratory distress. He has no wheezes. He has no rales.  Abdominal: Soft. There is no tenderness.  Musculoskeletal: He exhibits no edema or tenderness.  Lymphadenopathy:    He has no cervical adenopathy.  Neurological: He is alert and oriented to person, place, and time.  Skin: No rash noted. No erythema.  Psychiatric: He has a normal mood and affect. His behavior is normal.          Assessment & Plan:

## 2015-12-25 NOTE — Assessment & Plan Note (Signed)
Will check RPR for Dr Drucilla Schmidt

## 2015-12-25 NOTE — Progress Notes (Signed)
Pre visit review using our clinic review tool, if applicable. No additional management support is needed unless otherwise documented below in the visit note. 

## 2015-12-25 NOTE — Assessment & Plan Note (Signed)
No problems with statin 

## 2015-12-25 NOTE — Addendum Note (Signed)
Addended by: Pilar Grammes on: 12/25/2015 09:27 AM   Modules accepted: Orders

## 2015-12-25 NOTE — Assessment & Plan Note (Signed)
BP Readings from Last 3 Encounters:  12/25/15 124/84  10/07/15 136/83  04/01/15 (!) 168/80   Good control

## 2015-12-25 NOTE — Assessment & Plan Note (Addendum)
Discussed fitness Colon due next year Will give prevnar PSA deferred to at least next year chantix again for cigarette cessation

## 2015-12-26 LAB — RPR

## 2016-01-13 ENCOUNTER — Other Ambulatory Visit: Payer: Self-pay | Admitting: *Deleted

## 2016-01-13 DIAGNOSIS — B20 Human immunodeficiency virus [HIV] disease: Secondary | ICD-10-CM

## 2016-01-13 MED ORDER — EFAVIRENZ-EMTRICITAB-TENOFOVIR 600-200-300 MG PO TABS: 1.0000 | ORAL_TABLET | Freq: Every day | ORAL | 5 refills | Status: DC

## 2016-01-14 ENCOUNTER — Encounter: Payer: Self-pay | Admitting: Infectious Disease

## 2016-01-22 ENCOUNTER — Ambulatory Visit (INDEPENDENT_AMBULATORY_CARE_PROVIDER_SITE_OTHER): Payer: BLUE CROSS/BLUE SHIELD | Admitting: Infectious Disease

## 2016-01-22 ENCOUNTER — Encounter: Payer: Self-pay | Admitting: Infectious Disease

## 2016-01-22 VITALS — BP 128/71 | HR 90 | Temp 98.0°F | Ht 70.5 in | Wt 192.2 lb

## 2016-01-22 DIAGNOSIS — E785 Hyperlipidemia, unspecified: Secondary | ICD-10-CM | POA: Diagnosis not present

## 2016-01-22 DIAGNOSIS — Z23 Encounter for immunization: Secondary | ICD-10-CM

## 2016-01-22 DIAGNOSIS — I10 Essential (primary) hypertension: Secondary | ICD-10-CM | POA: Diagnosis not present

## 2016-01-22 DIAGNOSIS — B181 Chronic viral hepatitis B without delta-agent: Secondary | ICD-10-CM | POA: Diagnosis not present

## 2016-01-22 DIAGNOSIS — B20 Human immunodeficiency virus [HIV] disease: Secondary | ICD-10-CM | POA: Diagnosis not present

## 2016-01-22 MED ORDER — DOLUTEGRAVIR SODIUM 50 MG PO TABS: 50.0000 mg | ORAL_TABLET | Freq: Every day | ORAL | 7 refills | Status: DC

## 2016-01-22 MED ORDER — EMTRICITABINE-TENOFOVIR AF 200-25 MG PO TABS: 1.0000 | ORAL_TABLET | Freq: Every day | ORAL | 7 refills | Status: DC

## 2016-01-22 MED ORDER — ELVITEG-COBIC-EMTRICIT-TENOFAF 150-150-200-10 MG PO TABS: 1.0000 | ORAL_TABLET | Freq: Every day | ORAL | 11 refills | Status: DC

## 2016-01-22 NOTE — Progress Notes (Signed)
HPI: Dale Mcdonald is a 65 y.o. male with HIV and chronic HBV who met with pharmacy for medication review.  Allergies: No Known Allergies  Vitals: Temp: 98 F (36.7 C) (08/30 1009) Temp Source: Oral (08/30 1009) BP: 128/71 (08/30 1009) Pulse Rate: 90 (08/30 1009)  Past Medical History: Past Medical History:  Diagnosis Date  . Anxiety   . HIV (human immunodeficiency virus infection) ([place])   . Hyperlipidemia   . Hypertension     Social History: Social History   Social History  . Marital status: Widowed    Spouse name: N/A  . Number of children: 1  . Years of education: N/A   Occupational History  . [employer]   Social History Main Topics  . Smoking status: Current Every Day Smoker    Packs/day: 1.00    Types: Cigarettes  . Smokeless tobacco: Never Used     Comment: gave 1-800-QUIT-NOW, currently on Chantix  . Alcohol use No  . Drug use: No  . Sexual activity: Not Asked   Other Topics Concern  . None   Social History Narrative  . None   Current Regimen: Atripla 600-200-300 mg daily  Labs: HIV 1 RNA Quant (copies/mL)  Date Value  12/21/2013 <20  04/19/2013 <20  01/18/2013 <20   HIV-1 RNA Viral Load (no units)  Date Value  10/07/2015 <40  04/01/2015 <40  12/05/2014 <40   CD4 (no units)  Date Value  04/01/2015 629  06/06/2014 1,378  01/08/2012 872   CD4 T Cell Abs (/uL)  Date Value  12/06/2013 880  04/19/2013 940  01/18/2013 850   Hep B S Ab (no units)  Date Value  06/06/2014 NEG   Hepatitis B Surface Ag (no units)  Date Value  06/06/2014 POSITIVE (A)   HCV Ab (no units)  Date Value  06/06/2014 NEGATIVE    CrCl: CrCl cannot be calculated (Patient's most recent lab result is older than the maximum 21 days allowed.).  Lipids:    Component Value Date/Time   CHOL 164 12/25/2015 0952   TRIG 147.0 12/25/2015 0952   HDL 47.80 12/25/2015 0952   CHOLHDL 3 12/25/2015 0952   VLDL 29.4 12/25/2015 0952   LDLCALC 87 12/25/2015 0952    Assessment: Dale Mcdonald has been undetectable on Atripla, but Dr. Tommy Medal would like to switch him to a TAF based regimen for kidney and bone protection. Completed med red. Dale Mcdonald has DDIs with amlodipine and atorvastatin and patient is well-controlled on these medications. After discussion with patient, chose to start Dale Mcdonald. Patient agreed to 2 pill regimen; however, he explains that he has a 76-monthsupply left of Atripla and does not want to waste it. [facility]  Recommendations: -hep B labs today -finish Atripla supply then switch to Descovy 200-25 mg and Tivicay 50 mg daily -f/u with Dr. VTommy Medalafter switch   RGwenlyn Perking PharmD PGY1 Pharmacy Resident Pager: [phone]/30/2017 11:08 AM

## 2016-01-22 NOTE — Progress Notes (Signed)
Subjective:    Patient ID: Dale Mcdonald, male    DOB: 1951-04-29, 65 y.o.   MRN: [id number]  HPI  64 year old male who is doing superbly well on his antiviral regimen of Atripla to which he had been changed from his prior regimen of [place] Norvir Truvada which he had received through AIDS clinical trials group 5257 to Atripla.  His been enrolled in HALO study in the ACTG.  We tried to change him to [place] but his insurance would not cover this. He believes his insurance will now covert his. We considered change to [facility] but there are signficant drug drug interactions due to COBI so he was in the end agreeable to going with Tivicay and Descovy with consideration of BIC/FTC-TAF down the road.  He was also asking about the status of his hepatitis B.    Lab Results  Component Value Date   HIV1RNAQUANT <20 12/21/2013   HIV1RNAQUANT <20 04/19/2013   HIV1RNAQUANT <20 01/18/2013    Lab Results  Component Value Date   CD4TABS 629 04/01/2015   CD4TABS 1,378 06/06/2014   CD4TABS 880 12/06/2013   Past Medical History:  Diagnosis Date  . Anxiety   . HIV (human immunodeficiency virus infection) ([place])   . Hyperlipidemia   . Hypertension     No past surgical history on file.  No family history on file.    Social History   Social History  . Marital status: Widowed    Spouse name: N/A  . Number of children: 1  . Years of education: N/A   Occupational History  . [employer]   Social History Main Topics  . Smoking status: Current Every Day Smoker    Packs/day: 1.00    Types: Cigarettes  . Smokeless tobacco: Never Used     Comment: gave 1-800-QUIT-NOW, currently on Chantix  . Alcohol use No  . Drug use: No  . Sexual activity: Not Asked   Other Topics Concern  . None   Social History Narrative  . None    No Known Allergies   Current Outpatient Prescriptions:  .  amLODipine (NORVASC) 5 MG tablet, Take 1  tablet (5 mg total) by mouth daily., Disp: 90 tablet, Rfl: 3 .  aspirin EC 81 MG tablet, Take 81 mg by mouth daily., Disp: , Rfl:  .  atorvastatin (LIPITOR) 20 MG tablet, Take 1 tablet (20 mg total) by mouth daily., Disp: 90 tablet, Rfl: 3 .  efavirenz-emtricitabine-tenofovir (ATRIPLA) 600-200-300 MG tablet, Take 1 tablet by mouth at bedtime., Disp: , Rfl:  .  lisinopril-hydrochlorothiazide (PRINZIDE,ZESTORETIC) 20-25 MG tablet, TAKE 1 TABLET BY MOUTH EVERY DAY, Disp: 90 tablet, Rfl: 0 .  varenicline (CHANTIX PAK) 0.5 MG X 11 & 1 MG X 42 tablet, Take as directed on the package, Disp: 53 tablet, Rfl: 0 .  varenicline (CHANTIX) 1 MG tablet, Take 1 tablet (1 mg total) by mouth 2 (two) times daily., Disp: 60 tablet, Rfl: 5 .  [START ON 06/25/2016] dolutegravir (TIVICAY) 50 MG tablet, Take 1 tablet (50 mg total) by mouth daily., Disp: 30 tablet, Rfl: 7 .  [START ON 06/25/2016] emtricitabine-tenofovir AF (DESCOVY) 200-25 MG tablet, Take 1 tablet by mouth daily., Disp: 30 tablet, Rfl: 7     Review of Systems  Constitutional: Negative for activity change, appetite change, chills, diaphoresis, fatigue, fever and unexpected weight change.  HENT: Negative for congestion, rhinorrhea, sinus pressure, sneezing, sore throat and trouble swallowing.  Eyes: Negative for photophobia and visual disturbance.  Respiratory: Negative for cough, chest tightness, shortness of breath, wheezing and stridor.   Cardiovascular: Negative for chest pain, palpitations and leg swelling.  Gastrointestinal: Negative for abdominal distention, abdominal pain, anal bleeding, blood in stool, constipation, diarrhea, nausea and vomiting.  Genitourinary: Negative for difficulty urinating, dysuria, flank pain and hematuria.  Musculoskeletal: Negative for arthralgias, back pain, gait problem, joint swelling and myalgias.  Skin: Negative for color change, pallor, rash and wound.  Neurological: Negative for dizziness, tremors, weakness and  light-headedness.  Hematological: Negative for adenopathy. Does not bruise/bleed easily.  Psychiatric/Behavioral: Negative for agitation, behavioral problems, confusion, decreased concentration, dysphoric mood and sleep disturbance.       Objective:   Physical Exam  Constitutional: He is oriented to person, place, and time. He appears well-developed and well-nourished. No distress.  HENT:  Head: Normocephalic and atraumatic.  Mouth/Throat: Oropharynx is clear and moist. No oropharyngeal exudate.  Eyes: Conjunctivae and EOM are normal. Pupils are equal, round, and reactive to light. No scleral icterus.  Neck: Normal range of motion. Neck supple. No JVD present.  Cardiovascular: Normal rate, regular rhythm and normal heart sounds.  Exam reveals no gallop and no friction rub.   No murmur heard. Pulmonary/Chest: Effort normal and breath sounds normal. No respiratory distress. He has no wheezes. He has no rales. He exhibits no tenderness.  Abdominal: He exhibits no distension and no mass. There is no tenderness. There is no rebound and no guarding.  Musculoskeletal: He exhibits no edema or tenderness.  Lymphadenopathy:    He has no cervical adenopathy.  Neurological: He is alert and oriented to person, place, and time. He has normal reflexes. He exhibits normal muscle tone. Coordination normal.  Skin: Skin is warm and dry. He is not diaphoretic. No erythema. No pallor.  Psychiatric: He has a normal mood and affect. His behavior is normal. Judgment and thought content normal.          Assessment & Plan:   HIV: change to Tivicay and Descovy, recheck labs 2 months AFTER he makes this switch. He may not do this for some time since he still has a sig supply of Atripla  Chronic Hepatitis B without hepatic coma or delta agent: checking hep B DNA, Hep B E antigen Hep E antibody. [place] of liver to screen for [facility]   Hyperlipidemia continue statin.  Hypertension:  PCP managing this  We  spent  greater than 40 minutes with the patient including greater than 50% of time in face to face counsel of the patient with re to the hepatitis B, HIV, Hyperlipidemia, HTN and in coordination of their care.

## 2016-01-23 ENCOUNTER — Encounter: Payer: Self-pay | Admitting: Infectious Disease

## 2016-01-24 ENCOUNTER — Other Ambulatory Visit: Payer: Self-pay | Admitting: Internal Medicine

## 2016-01-28 LAB — HEPATITIS B DNA, ULTRAQUANTITATIVE, PCR
Hepatitis B DNA (Calc): <1.3 Log IU/mL (ref <1.30)
Hepatitis B DNA: <20 IU/mL (ref <20)

## 2016-01-29 LAB — HEPATITIS B E ANTIBODY: Hepatitis Be Antibody: NONREACTIVE

## 2016-01-29 LAB — HEPATITIS B E ANTIGEN: Hepatitis Be Antigen: NONREACTIVE

## 2016-02-21 ENCOUNTER — Other Ambulatory Visit: Payer: Self-pay | Admitting: *Deleted

## 2016-02-21 DIAGNOSIS — B20 Human immunodeficiency virus [HIV] disease: Secondary | ICD-10-CM

## 2016-02-21 MED ORDER — EMTRICITABINE-TENOFOVIR AF 200-25 MG PO TABS: 1.0000 | ORAL_TABLET | Freq: Every day | ORAL | 5 refills | Status: DC

## 2016-02-21 MED ORDER — DOLUTEGRAVIR SODIUM 50 MG PO TABS: 50.0000 mg | ORAL_TABLET | Freq: Every day | ORAL | 5 refills | Status: DC

## 2016-02-24 ENCOUNTER — Encounter (INDEPENDENT_AMBULATORY_CARE_PROVIDER_SITE_OTHER): Payer: Self-pay | Admitting: *Deleted

## 2016-02-24 VITALS — BP 127/84 | HR 76 | Temp 97.8°F | Resp 16 | Wt 193.8 lb

## 2016-02-24 DIAGNOSIS — Z006 Encounter for examination for normal comparison and control in clinical research program: Secondary | ICD-10-CM

## 2016-02-24 LAB — COMPREHENSIVE METABOLIC PANEL
ALBUMIN: 4.4 g/dL (ref 3.6–5.1)
ALK PHOS: 96 U/L (ref 40–115)
ALT: 31 U/L (ref 9–46)
AST: 26 U/L (ref 10–35)
BILIRUBIN TOTAL: 0.4 mg/dL (ref 0.2–1.2)
BUN: 12 mg/dL (ref 7–25)
CHLORIDE: 100 mmol/L (ref 98–110)
CO2: 26 mmol/L (ref 20–31)
CREATININE: 1.04 mg/dL (ref 0.70–1.25)
Calcium: 9.7 mg/dL (ref 8.6–10.3)
Glucose, Bld: 119 mg/dL — ABNORMAL HIGH (ref 65–99)
Potassium: 4 mmol/L (ref 3.5–5.3)
SODIUM: 136 mmol/L (ref 135–146)
TOTAL PROTEIN: 7.2 g/dL (ref 6.1–8.1)

## 2016-02-24 LAB — LIPID PANEL
Cholesterol: 165 mg/dL (ref 125–200)
HDL: 46 mg/dL (ref >=40)
LDL CALC: 91 mg/dL (ref <130)
Total CHOL/HDL Ratio: 3.6 Ratio (ref <=5.0)
Triglycerides: 138 mg/dL (ref <150)
VLDL: 28 mg/dL (ref <30)

## 2016-02-24 NOTE — Progress Notes (Signed)
Dale Mcdonald is here for week 192 A5322. HAILO Study: A Long Term follow-up of Older HIV-Infected Adults in the ACTG, an observational study addressing the issues of aging, HIV infection and Inflammation. No new complaints or concerns verbalized. Next study visit scheduled for 4/18 /18 @ 10:00am.

## 2016-02-25 LAB — CREATININE, URINE, RANDOM: Creatinine, Urine: 79 mg/dL (ref 20–370)

## 2016-02-25 LAB — PROTEIN, URINE, RANDOM: Total Protein, Urine: 17 mg/dL (ref 5–25)

## 2016-02-25 LAB — HEPATITIS B SURFACE ANTIGEN: Hepatitis B Surface Ag: POSITIVE — AB

## 2016-02-25 LAB — HEPATITIS B SURFACE ANTIBODY,QUALITATIVE: Hep B S Ab: NEGATIVE

## 2016-02-25 LAB — HEPATITIS B SURF AG CONFIRMATION: Hepatitis B Surf Ag Confirmation: POSITIVE — AB

## 2016-02-25 LAB — HEMOGLOBIN A1C
HEMOGLOBIN A1C: 6.2 % — AB (ref <5.7)
MEAN PLASMA GLUCOSE: 131 mg/dL

## 2016-02-25 LAB — HEPATITIS C ANTIBODY: HCV AB: NEGATIVE

## 2016-02-26 LAB — CD4/CD8 (T-HELPER/T-SUPPRESSOR CELL)
CD4 % Helper T Cell: 42.1
CD4: 926
CD8 T CELL SUPPRESSOR: 41.3
CD8: 909

## 2016-02-27 ENCOUNTER — Encounter: Payer: Self-pay | Admitting: Infectious Disease

## 2016-02-29 ENCOUNTER — Other Ambulatory Visit: Payer: Self-pay | Admitting: Internal Medicine

## 2016-03-06 ENCOUNTER — Encounter: Payer: Self-pay | Admitting: Infectious Disease

## 2016-03-20 ENCOUNTER — Encounter: Payer: Self-pay | Admitting: *Deleted

## 2016-03-20 LAB — HIV-1 RNA QUANT-NO REFLEX-BLD

## 2016-03-27 ENCOUNTER — Encounter: Payer: Self-pay | Admitting: Infectious Disease

## 2016-04-02 ENCOUNTER — Encounter: Payer: Self-pay | Admitting: Internal Medicine

## 2016-04-02 MED ORDER — AMLODIPINE BESYLATE 5 MG PO TABS: 5.0000 mg | ORAL_TABLET | Freq: Every day | ORAL | 3 refills | Status: DC

## 2016-04-03 MED ORDER — AMLODIPINE BESYLATE 5 MG PO TABS: 5.0000 mg | ORAL_TABLET | Freq: Every day | ORAL | 3 refills | Status: DC

## 2016-04-03 NOTE — Addendum Note (Signed)
Addended by: Pilar Grammes on: 04/03/2016 08:20 AM   Modules accepted: Orders

## 2016-04-09 ENCOUNTER — Other Ambulatory Visit: Payer: Self-pay | Admitting: Internal Medicine

## 2016-04-09 ENCOUNTER — Encounter: Payer: Self-pay | Admitting: Internal Medicine

## 2016-04-09 MED ORDER — LISINOPRIL-HYDROCHLOROTHIAZIDE 20-25 MG PO TABS: 1.0000 | ORAL_TABLET | Freq: Every day | ORAL | 3 refills | Status: DC

## 2016-04-09 NOTE — Telephone Encounter (Signed)
Rx sent electronically.  

## 2016-05-23 ENCOUNTER — Ambulatory Visit (HOSPITAL_COMMUNITY)
Admission: EM | Admit: 2016-05-23 | Discharge: 2016-05-23 | Disposition: A | Discharge status: Home / Self Care | Payer: BLUE CROSS/BLUE SHIELD | Attending: Emergency Medicine | Admitting: Emergency Medicine

## 2016-05-23 ENCOUNTER — Encounter (HOSPITAL_COMMUNITY): Payer: Self-pay | Admitting: Emergency Medicine

## 2016-05-23 DIAGNOSIS — M549 Dorsalgia, unspecified: Secondary | ICD-10-CM | POA: Diagnosis not present

## 2016-05-23 MED ORDER — HYDROCODONE-ACETAMINOPHEN 5-325 MG PO TABS: 1.0000 | ORAL_TABLET | Freq: Four times a day (QID) | ORAL | 0 refills | Status: DC | PRN

## 2016-05-23 MED ORDER — PREDNISONE 20 MG PO TABS: 60.0000 mg | ORAL_TABLET | Freq: Every day | ORAL | 0 refills | Status: DC

## 2016-05-23 NOTE — ED Provider Notes (Signed)
[facility]    CSN: [id number] Arrival date & time: 05/23/16  1416     History   Chief Complaint Chief Complaint  Patient presents with  . Back Pain    HPI Dale Mcdonald is a 65 y.o. male.   HPI  He is a 65 year old man here for evaluation of back pain. The pain is in the right mid back. It radiates down the back side of his arm to the elbow. He denies any injury or trauma. He did just get back from a long car trip to [place] where he stayed in hotels. Pain started on Wednesday. He has been taking round-the-clock ibuprofen and has also tried Flexeril with minimal improvement. Pain is worse with certain movements. The most comfortable way for him to be is lying down. No shortness of breath or cough. No leg swelling.  Past Medical History:  Diagnosis Date  . Anxiety   . HIV (human immunodeficiency virus infection) ([place])   . Hyperlipidemia   . Hypertension     Patient Active Problem List   Diagnosis Date Noted  . Episodic mood disorder ([place]) 09/24/2014  . Routine general medical examination at a health care facility 07/25/2012  . SMOKER 06/04/2010  . Insomnia 02/04/2009  . Chronic hepatitis B virus infection ([place]) 12/03/2008  . Human immunodeficiency virus (HIV) disease ([place]) 11/21/2008  . Hyperlipemia 10/28/2007  . Essential hypertension 10/28/2007  . ALLERGIC RHINITIS 10/28/2007  . ACTINIC KERATOSIS 10/28/2007    History reviewed. No pertinent surgical history.     Home Medications    Prior to Admission medications   Medication Sig Start Date End Date Taking? Authorizing Provider  amLODipine (NORVASC) 5 MG tablet Take 1 tablet (5 mg total) by mouth daily. 04/03/16  Yes [name], MD  aspirin EC 81 MG tablet Take 81 mg by mouth daily.   Yes Historical Provider, MD  atorvastatin (LIPITOR) 20 MG tablet Take 1 tablet (20 mg total) by mouth daily. 12/25/15  Yes [name], MD  efavirenz-emtricitabine-tenofovir (ATRIPLA) 600-200-300 MG tablet  Take 1 tablet by mouth at bedtime.   Yes Historical Provider, MD  lisinopril-hydrochlorothiazide (PRINZIDE,ZESTORETIC) 20-25 MG tablet Take 1 tablet by mouth daily. 04/09/16  Yes [name], MD  varenicline (CHANTIX PAK) 0.5 MG X 11 & 1 MG X 42 tablet Take as directed on the package 12/25/15  Yes [name], MD  dolutegravir (TIVICAY) 50 MG tablet Take 1 tablet (50 mg total) by mouth daily. 06/25/16   Truman Hayward, MD  emtricitabine-tenofovir AF (DESCOVY) 200-25 MG tablet Take 1 tablet by mouth daily. 06/25/16   Truman Hayward, MD  HYDROcodone-acetaminophen [facility]) 5-325 MG tablet Take 1 tablet by mouth every 6 (six) hours as needed for moderate pain. 05/23/16   Melony Overly, MD  predniSONE (DELTASONE) 20 MG tablet Take 3 tablets (60 mg total) by mouth daily with breakfast. 05/23/16   Melony Overly, MD  varenicline (CHANTIX) 1 MG tablet Take 1 tablet (1 mg total) by mouth 2 (two) times daily. 12/25/15   [name], MD    Family History History reviewed. No pertinent family history.  Social History Social History  Substance Use Topics  . Smoking status: Current Every Day Smoker    Packs/day: 1.00    Types: Cigarettes  . Smokeless tobacco: Never Used     Comment: gave 1-800-QUIT-NOW, currently on Chantix  . Alcohol use No     Allergies   Patient  has no known allergies.   Review of Systems Review of Systems As in history of present illness  Physical Exam Triage Vital Signs ED Triage Vitals  Enc Vitals Group     BP 05/23/16 1614 165/72     Pulse Rate 05/23/16 1614 98     Resp 05/23/16 1614 20     Temp 05/23/16 1614 98.4 F (36.9 C)     Temp Source 05/23/16 1614 Oral     SpO2 05/23/16 1614 97 %     Weight --      Height --      Head Circumference --      Peak Flow --      Pain Score 05/23/16 1613 8     Pain Loc --      Pain Edu? --      Excl. in [place]? --    No data found.   Updated Vital Signs BP 165/72 (BP Location: Left Arm)   Pulse 98   Temp  98.4 F (36.9 C) (Oral)   Resp 20   SpO2 97%   Visual Acuity Right Eye Distance:   Left Eye Distance:   Bilateral Distance:    Right Eye Near:   Left Eye Near:    Bilateral Near:     Physical Exam  Constitutional: He is oriented to person, place, and time. He appears well-developed and well-nourished. No distress.  Cardiovascular: Normal rate.   Pulmonary/Chest: Effort normal.  Musculoskeletal:  Back: No vertebral tenderness or step-offs. No obvious deformity. He is point tender at the inferior aspect of the medial right scapula border. This reproduces his pain. No tenderness of the arm itself. 5 out of 5 strength in bilateral upper extremities, but pain with biceps testing.  Neurological: He is alert and oriented to person, place, and time.     UC Treatments / Results  Labs (all labs ordered are listed, but only abnormal results are displayed) Labs Reviewed - No data to display  EKG  EKG Interpretation None       Radiology No results found.  Procedures Procedures (including critical care time)  Medications Ordered in UC Medications - No data to display   Initial Impression / Assessment and Plan / UC Course  I have reviewed the triage vital signs and the nursing notes.  Pertinent labs & imaging results that were available during my care of the patient were reviewed by me and considered in my medical decision making (see chart for details).  Clinical Course     No indication at this is cardiac or pulmonary. Suspect musculoskeletal etiology. Will place on prednisone for anti-inflammatory effects. Hydrocodone to use as needed for pain. If not improving in 1 week, follow-up with PCP.  Final Clinical Impressions(s) / UC Diagnoses   Final diagnoses:  Mid back pain on right side    New Prescriptions Discharge Medication List as of 05/23/2016  4:36 PM    START taking these medications   Details  HYDROcodone-acetaminophen (NORCO) 5-325 MG tablet Take 1 tablet  by mouth every 6 (six) hours as needed for moderate pain., Starting Sat 05/23/2016, Print    predniSONE (DELTASONE) 20 MG tablet Take 3 tablets (60 mg total) by mouth daily with breakfast., Starting Sat 05/23/2016, Normal         Melony Overly, MD 05/23/16 1655

## 2016-05-23 NOTE — ED Triage Notes (Signed)
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 NAD

## 2016-05-23 NOTE — Discharge Instructions (Signed)
Your pain seems to be coming from muscles and nerves. Take prednisone daily for 5 days. Continue ibuprofen as needed. Use the hydrocodone every 4-6 hours as needed for severe pain. Apply heat as often as you can. If this is not improving in the next week, please follow-up with your primary care doctor.

## 2016-05-29 ENCOUNTER — Ambulatory Visit (INDEPENDENT_AMBULATORY_CARE_PROVIDER_SITE_OTHER): Payer: BLUE CROSS/BLUE SHIELD | Admitting: Family Medicine

## 2016-05-29 ENCOUNTER — Encounter: Payer: Self-pay | Admitting: Family Medicine

## 2016-05-29 VITALS — BP 138/74 | HR 88 | Temp 98.0°F | Ht 71.0 in | Wt 196.2 lb

## 2016-05-29 DIAGNOSIS — S29012A Strain of muscle and tendon of back wall of thorax, initial encounter: Secondary | ICD-10-CM | POA: Diagnosis not present

## 2016-05-29 MED ORDER — HYDROCODONE-ACETAMINOPHEN 5-325 MG PO TABS: 1.0000 | ORAL_TABLET | Freq: Three times a day (TID) | ORAL | 0 refills | Status: DC | PRN

## 2016-05-29 NOTE — Patient Instructions (Addendum)
Do your stretches at least twice a day. Hold it for 15-20 seconds and repeat 2-3 times.  Continue using heat.  Use ibuprofen for pain. Use the pain medication only if these do not work.

## 2016-05-29 NOTE — Progress Notes (Signed)
Pre visit review using our clinic review tool, if applicable. No additional management support is needed unless otherwise documented below in the visit note. 

## 2016-05-29 NOTE — Progress Notes (Signed)
Chief Complaint  Patient presents with  . Back Pain    Pt reports mid back pain with dull achy and sharp feeling/ Pt states he also has Rt sided arm pain     Subjective:  Patient is a 66 y.o. male here for upper back pain.  R upper back pain for 9 days. Had been getting better after going to [facility] over weekend. Was given Norco and steroid burst. Started to return yesterday so he made appt to be seen before weekend. He was in [place] over hte holiday and slept on a foreign bed in addition to driving for 17 hours each way. This has happened in the past, but got better after a few days. He describes the pain as largely constant and sharp. It will intermittently radiate down his R arm. Palliation when he sits or lies down. Provocation when he lies on his R side. He finds that when he goes to sleep, he will wake up in pain due to sleeping on that side. No numbness, tingling or weakness. He has not been doing stretches/exercises.  ROS: Neuro: No numbness or tingling Msk: As noted in HPI  Past Medical History:  Diagnosis Date  . Anxiety   . HIV (human immunodeficiency virus infection) ([place])   . Hyperlipidemia   . Hypertension    No Known Allergies  Current Outpatient Prescriptions:  .  amLODipine (NORVASC) 5 MG tablet, Take 1 tablet (5 mg total) by mouth daily., Disp: 90 tablet, Rfl: 3 .  aspirin EC 81 MG tablet, Take 81 mg by mouth daily., Disp: , Rfl:  .  atorvastatin (LIPITOR) 20 MG tablet, Take 1 tablet (20 mg total) by mouth daily., Disp: 90 tablet, Rfl: 3 .  [START ON 06/25/2016] dolutegravir (TIVICAY) 50 MG tablet, Take 1 tablet (50 mg total) by mouth daily., Disp: 30 tablet, Rfl: 5 .  efavirenz-emtricitabine-tenofovir (ATRIPLA) 600-200-300 MG tablet, Take 1 tablet by mouth at bedtime., Disp: , Rfl:  .  [START ON 06/25/2016] emtricitabine-tenofovir AF (DESCOVY) 200-25 MG tablet, Take 1 tablet by mouth daily., Disp: 30 tablet, Rfl: 5 .  HYDROcodone-acetaminophen (NORCO) 5-325 MG tablet, Take 1  tablet by mouth every 8 (eight) hours as needed for moderate pain., Disp: 15 tablet, Rfl: 0 .  lisinopril-hydrochlorothiazide (PRINZIDE,ZESTORETIC) 20-25 MG tablet, Take 1 tablet by mouth daily., Disp: 90 tablet, Rfl: 3 .  predniSONE (DELTASONE) 20 MG tablet, Take 3 tablets (60 mg total) by mouth daily with breakfast. (Patient not taking: Reported on 05/29/2016), Disp: 15 tablet, Rfl: 0 .  varenicline (CHANTIX PAK) 0.5 MG X 11 & 1 MG X 42 tablet, Take as directed on the package (Patient not taking: Reported on 05/29/2016), Disp: 53 tablet, Rfl: 0 .  varenicline (CHANTIX) 1 MG tablet, Take 1 tablet (1 mg total) by mouth 2 (two) times daily. (Patient not taking: Reported on 05/29/2016), Disp: 60 tablet, Rfl: 5  Objective: BP 138/74 (BP Location: Left Arm, Patient Position: Sitting, Cuff Size: Small)   Pulse 88   Temp 98 F (36.7 C) (Oral)   Ht 5\' 11"  (1.803 m)   Wt 196 lb 3.2 oz (89 kg)   SpO2 98%   BMI 27.36 kg/m  General: Awake, appears stated age HEENT: MMM, EOMi Lungs: No accessory muscle use MSK: +TTP over R rhomboid region spanning from approx T3-T6. Posterior ribs on R. 5/5 strength  Neuro: 2/4 biceps reflex b/l. No cerebellar signs Psych: Age appropriate judgment and insight, normal affect and mood  PROCEDURE NOTE Verbal consent was  obtained.  Pre-procedure diagnosis: Somatic dysfunction Post-procedure diagnosis: Same Procedure: OMT  Regions treated include throacic spine/ribs: HVLA with the tissue response noted to be slightly improved.  The patient tolerated the procedure well, and there were no complications noted.  The patient was warned of the possibility of increased pain or stiffness of up to 48 hours duration and was asked to call with any unexpected problems.   Assessment and Plan: Strain of rhomboid muscle, initial encounter - Plan: HYDROcodone-acetaminophen (NORCO) 5-325 MG tablet  Orders as above. Short refill for breakthrough pain. Continue with  ibuprofen. Discussed and demonstrated stretches for rhomboids. He did note that he felt the stretch while doing them. Continue using heat. Audible pop heard during HVLA. Hopefully this will help facilitate healing. F/u with reg PCP if symptoms worsen or fail to improve. Would likely require physical therapy at that time. The patient voiced understanding and agreement to the plan.  [place], DO 05/29/16  1:41 PM

## 2016-06-01 ENCOUNTER — Ambulatory Visit: Payer: BLUE CROSS/BLUE SHIELD | Admitting: Family Medicine

## 2016-06-25 ENCOUNTER — Ambulatory Visit (INDEPENDENT_AMBULATORY_CARE_PROVIDER_SITE_OTHER): Payer: Medicare Other | Admitting: Family Medicine

## 2016-06-25 ENCOUNTER — Encounter: Payer: Self-pay | Admitting: Family Medicine

## 2016-06-25 VITALS — BP 134/74 | HR 103 | Temp 98.1°F | Ht 71.0 in | Wt 195.0 lb

## 2016-06-25 DIAGNOSIS — M5412 Radiculopathy, cervical region: Secondary | ICD-10-CM | POA: Diagnosis not present

## 2016-06-25 MED ORDER — PREDNISONE 20 MG PO TABS: ORAL_TABLET | ORAL | 0 refills | Status: DC

## 2016-06-25 MED ORDER — AMITRIPTYLINE HCL 25 MG PO TABS: 25.0000 mg | ORAL_TABLET | Freq: Every day | ORAL | 1 refills | Status: DC

## 2016-06-25 NOTE — Progress Notes (Signed)
Pre visit review using our clinic review tool, if applicable. No additional management support is needed unless otherwise documented below in the visit note. 

## 2016-06-25 NOTE — Progress Notes (Signed)
Dr. Frederico Hamman T. Jacolby Risby, MD, [facility] [place], [address] Phone: [phone] Fax: [phone]  06/25/2016  Patient: Dale Mcdonald, MRN: [id number], DOB: 01-Nov-1950, 66 y.o.  Primary Physician:  [name], MD   Chief Complaint  Patient presents with  . Back Pain  . Headache   Subjective:   Dale Mcdonald is a 66 y.o. very pleasant male patient who presents with the following:  17 hour drive, saw Dr. Hanley Ben at [facility]. He did some osteopathic maneuvers with shoulder blade which "hurt a lot."  If doing anything at all, it aggravates it.  Taking a lot of ibuprofen.   Radicular pain down his R arm inferiorly.  Bottom of arm and down his arm.  Some neck pain, HA, and shoulder blade pain.  Past Medical History, Surgical History, Social History, Family History, Problem List, Medications, and Allergies have been reviewed and updated if relevant.  Patient Active Problem List   Diagnosis Date Noted  . Episodic mood disorder ([place]) 09/24/2014  . Routine general medical examination at a health care facility 07/25/2012  . SMOKER 06/04/2010  . Insomnia 02/04/2009  . Chronic hepatitis B virus infection ([place]) 12/03/2008  . Human immunodeficiency virus (HIV) disease ([place]) 11/21/2008  . Hyperlipemia 10/28/2007  . Essential hypertension 10/28/2007  . ALLERGIC RHINITIS 10/28/2007  . ACTINIC KERATOSIS 10/28/2007    Past Medical History:  Diagnosis Date  . Anxiety   . HIV (human immunodeficiency virus infection) ([place])   . Hyperlipidemia   . Hypertension     No past surgical history on file.  Social History   Social History  . Marital status: Widowed    Spouse name: N/A  . Number of children: 1  . Years of education: N/A   Occupational History  . [employer]   Social History Main Topics  . Smoking status: Current Every Day Smoker    Packs/day: 1.00     Types: Cigarettes  . Smokeless tobacco: Never Used     Comment: gave 1-800-QUIT-NOW, currently on Chantix  . Alcohol use No  . Drug use: No  . Sexual activity: Not on file   Other Topics Concern  . Not on file   Social History Narrative  . No narrative on file    No family history on file.  No Known Allergies  Medication list reviewed and updated in full in [place].  GEN: no acute illness or fever CV: No chest pain or shortness of breath MSK: detailed above Neuro: neurological signs are described above ROS O/w per HPI  Objective:   BP 134/74   Pulse (!) 103   Temp 98.1 F (36.7 C) (Oral)   Ht 5\' 11"  (1.803 m)   Wt 195 lb (88.5 kg)   BMI 27.20 kg/m    GEN: Well-developed,well-nourished,in no acute distress; alert,appropriate and cooperative throughout examination HEENT: Normocephalic and atraumatic without obvious abnormalities. Ears, externally no deformities PULM: Breathing comfortably in no respiratory distress EXT: No clubbing, cyanosis, or edema PSYCH: Normally interactive. Cooperative during the interview. Pleasant. Friendly and conversant. Not anxious or depressed appearing. Normal, full affect.  CERVICAL SPINE EXAM Range of motion: Flexion, extension, lateral bending, and rotation: approx 20% loss of motion each direction Pain with terminal motion: yes Spinous Processes: NT SCM: NT Upper paracervical muscles: ttp Upper traps: TTP C5-T1 intact, sensation and motor  - some tingling along inferior of upper arm,  forearm, hand, fingers 3,4,5 without sensory loss or motion changes  Radiology: No results found.  Assessment and Plan:   Right cervical radiculopathy  Probable nerve root encroachment on the R - most likely bone or disc. Would recommend longer prednisone taper, TCA for neuropathic pain properties.   Follow-up: if needed  Meds ordered this encounter  Medications  . predniSONE (DELTASONE) 20 MG tablet    Sig: 2 tabs po for 6  days, then 1 tab for 6 days    Dispense:  18 tablet    Refill:  0  . amitriptyline (ELAVIL) 25 MG tablet    Sig: Take 1 tablet (25 mg total) by mouth at bedtime.    Dispense:  30 tablet    Refill:  1   Medications Discontinued During This Encounter  Medication Reason  . predniSONE (DELTASONE) 20 MG tablet Completed Course    Signed,  Hanson Medeiros T. Domenique Southers, MD   Allergies as of 06/25/2016   No Known Allergies     Medication List       Accurate as of 06/25/16 11:59 PM. Always use your most recent med list.          amitriptyline 25 MG tablet Commonly known as:  ELAVIL Take 1 tablet (25 mg total) by mouth at bedtime.   amLODipine 5 MG tablet Commonly known as:  NORVASC Take 1 tablet (5 mg total) by mouth daily.   aspirin EC 81 MG tablet Take 81 mg by mouth daily.   atorvastatin 20 MG tablet Commonly known as:  LIPITOR Take 1 tablet (20 mg total) by mouth daily.   dolutegravir 50 MG tablet Commonly known as:  TIVICAY Take 1 tablet (50 mg total) by mouth daily.   efavirenz-emtricitabine-tenofovir 600-200-300 MG tablet Commonly known as:  ATRIPLA Take 1 tablet by mouth at bedtime.   emtricitabine-tenofovir AF 200-25 MG tablet Commonly known as:  DESCOVY Take 1 tablet by mouth daily.   HYDROcodone-acetaminophen 5-325 MG tablet Commonly known as:  NORCO Take 1 tablet by mouth every 8 (eight) hours as needed for moderate pain.   lisinopril-hydrochlorothiazide 20-25 MG tablet Commonly known as:  PRINZIDE,ZESTORETIC Take 1 tablet by mouth daily.   predniSONE 20 MG tablet Commonly known as:  DELTASONE 2 tabs po for 6 days, then 1 tab for 6 days   varenicline 0.5 MG X 11 & 1 MG X 42 tablet Commonly known as:  CHANTIX PAK Take as directed on the package   varenicline 1 MG tablet Commonly known as:  CHANTIX Take 1 tablet (1 mg total) by mouth 2 (two) times daily.

## 2016-07-14 ENCOUNTER — Encounter: Payer: Self-pay | Admitting: Infectious Disease

## 2016-07-31 ENCOUNTER — Other Ambulatory Visit: Payer: Self-pay | Admitting: Infectious Disease

## 2016-07-31 ENCOUNTER — Encounter: Payer: Self-pay | Admitting: Infectious Disease

## 2016-07-31 DIAGNOSIS — B20 Human immunodeficiency virus [HIV] disease: Secondary | ICD-10-CM

## 2016-08-03 ENCOUNTER — Other Ambulatory Visit: Payer: Self-pay | Admitting: *Deleted

## 2016-08-03 DIAGNOSIS — B20 Human immunodeficiency virus [HIV] disease: Secondary | ICD-10-CM

## 2016-08-03 MED ORDER — DOLUTEGRAVIR SODIUM 50 MG PO TABS: 50.0000 mg | ORAL_TABLET | Freq: Every day | ORAL | 5 refills | Status: DC

## 2016-08-03 MED ORDER — EMTRICITABINE-TENOFOVIR AF 200-25 MG PO TABS: 1.0000 | ORAL_TABLET | Freq: Every day | ORAL | 5 refills | Status: DC

## 2016-09-05 ENCOUNTER — Other Ambulatory Visit: Payer: Self-pay | Admitting: Family Medicine

## 2016-09-09 ENCOUNTER — Encounter (INDEPENDENT_AMBULATORY_CARE_PROVIDER_SITE_OTHER): Payer: Medicare Other | Admitting: *Deleted

## 2016-09-09 VITALS — BP 134/81 | HR 89 | Temp 98.5°F | Wt 191.8 lb

## 2016-09-09 DIAGNOSIS — Z006 Encounter for examination for normal comparison and control in clinical research program: Secondary | ICD-10-CM

## 2016-09-09 NOTE — Progress Notes (Signed)
Dale Mcdonald is here for week 216 visit for The HAILO Study: A Long Term follow-up of Older HIV-Infected Adults in the ACTG, an observational study addressing the issues of aging, HIV infection and Inflammation. He denies any new problems or concerns except for having a shoulder strain and a pinched nerve which is better now. He had gone to [place] and had some dental work done and that had gone well. He is still on Atripla and will finish up with what he has in about 5 months, then he will start the [place] and Descovy. He will return for study in December which should be about 2 months after he is on the new drugs. And then he will see Dr. Tommy Medal after that.

## 2016-09-23 ENCOUNTER — Encounter: Payer: Self-pay | Admitting: *Deleted

## 2016-09-23 LAB — HIV-1 RNA QUANT-NO REFLEX-BLD

## 2016-10-05 ENCOUNTER — Encounter: Payer: Self-pay | Admitting: Internal Medicine

## 2016-12-10 ENCOUNTER — Other Ambulatory Visit: Payer: Self-pay | Admitting: Internal Medicine

## 2016-12-10 DIAGNOSIS — E785 Hyperlipidemia, unspecified: Secondary | ICD-10-CM

## 2016-12-28 ENCOUNTER — Encounter: Payer: Self-pay | Admitting: Internal Medicine

## 2016-12-28 ENCOUNTER — Ambulatory Visit (INDEPENDENT_AMBULATORY_CARE_PROVIDER_SITE_OTHER): Payer: Medicare Other | Admitting: Internal Medicine

## 2016-12-28 VITALS — BP 116/76 | HR 87 | Temp 98.1°F | Ht 70.0 in | Wt 188.0 lb

## 2016-12-28 DIAGNOSIS — Z23 Encounter for immunization: Secondary | ICD-10-CM

## 2016-12-28 DIAGNOSIS — Z0000 Encounter for general adult medical examination without abnormal findings: Secondary | ICD-10-CM

## 2016-12-28 DIAGNOSIS — Z1211 Encounter for screening for malignant neoplasm of colon: Secondary | ICD-10-CM | POA: Diagnosis not present

## 2016-12-28 DIAGNOSIS — B20 Human immunodeficiency virus [HIV] disease: Secondary | ICD-10-CM

## 2016-12-28 DIAGNOSIS — F39 Unspecified mood [affective] disorder: Secondary | ICD-10-CM

## 2016-12-28 DIAGNOSIS — E785 Hyperlipidemia, unspecified: Secondary | ICD-10-CM

## 2016-12-28 DIAGNOSIS — B181 Chronic viral hepatitis B without delta-agent: Secondary | ICD-10-CM

## 2016-12-28 DIAGNOSIS — I10 Essential (primary) hypertension: Secondary | ICD-10-CM | POA: Diagnosis not present

## 2016-12-28 MED ORDER — ALPRAZOLAM 0.25 MG PO TABS: 0.2500 mg | ORAL_TABLET | Freq: Two times a day (BID) | ORAL | 0 refills | Status: DC | PRN

## 2016-12-28 MED ORDER — AMLODIPINE BESYLATE 5 MG PO TABS: 5.0000 mg | ORAL_TABLET | Freq: Every day | ORAL | 3 refills | Status: DC

## 2016-12-28 MED ORDER — LISINOPRIL-HYDROCHLOROTHIAZIDE 20-25 MG PO TABS: 1.0000 | ORAL_TABLET | Freq: Every day | ORAL | 3 refills | Status: DC

## 2016-12-28 MED ORDER — ATORVASTATIN CALCIUM 20 MG PO TABS: 20.0000 mg | ORAL_TABLET | Freq: Every day | ORAL | 3 refills | Status: DC

## 2016-12-28 NOTE — Assessment & Plan Note (Signed)
Followed by Dr Drucilla Schmidt

## 2016-12-28 NOTE — Patient Instructions (Signed)
DASH Eating Plan DASH stands for "Dietary Approaches to Stop Hypertension." The DASH eating plan is a healthy eating plan that has been shown to reduce high blood pressure (hypertension). It may also reduce your risk for type 2 diabetes, heart disease, and stroke. The DASH eating plan may also help with weight loss. What are tips for following this plan? General guidelines  Avoid eating more than 2,300 mg (milligrams) of salt (sodium) a day. If you have hypertension, you may need to reduce your sodium intake to 1,500 mg a day.  Limit alcohol intake to no more than 1 drink a day for nonpregnant women and 2 drinks a day for men. One drink equals 12 oz of beer, 5 oz of wine, or 1 oz of hard liquor.  Work with your health care provider to maintain a healthy body weight or to lose weight. Ask what an ideal weight is for you.  Get at least 30 minutes of exercise that causes your heart to beat faster (aerobic exercise) most days of the week. Activities may include walking, swimming, or biking.  Work with your health care provider or diet and nutrition specialist (dietitian) to adjust your eating plan to your individual calorie needs. Reading food labels  Check food labels for the amount of sodium per serving. Choose foods with less than 5 percent of the Daily Value of sodium. Generally, foods with less than 300 mg of sodium per serving fit into this eating plan.  To find whole grains, look for the word "whole" as the first word in the ingredient list. Shopping  Buy products labeled as "low-sodium" or "no salt added."  Buy fresh foods. Avoid canned foods and premade or frozen meals. Cooking  Avoid adding salt when cooking. Use salt-free seasonings or herbs instead of table salt or sea salt. Check with your health care provider or pharmacist before using salt substitutes.  Do not fry foods. Cook foods using healthy methods such as baking, boiling, grilling, and broiling instead.  Cook with  heart-healthy oils, such as olive, canola, soybean, or sunflower oil. Meal planning   Eat a balanced diet that includes: ? 5 or more servings of fruits and vegetables each day. At each meal, try to fill half of your plate with fruits and vegetables. ? Up to 6-8 servings of whole grains each day. ? Less than 6 oz of lean meat, poultry, or fish each day. A 3-oz serving of meat is about the same size as a deck of cards. One egg equals 1 oz. ? 2 servings of low-fat dairy each day. ? A serving of nuts, seeds, or beans 5 times each week. ? Heart-healthy fats. Healthy fats called Omega-3 fatty acids are found in foods such as flaxseeds and coldwater fish, like sardines, salmon, and mackerel.  Limit how much you eat of the following: ? Canned or prepackaged foods. ? Food that is high in trans fat, such as fried foods. ? Food that is high in saturated fat, such as fatty meat. ? Sweets, desserts, sugary drinks, and other foods with added sugar. ? Full-fat dairy products.  Do not salt foods before eating.  Try to eat at least 2 vegetarian meals each week.  Eat more home-cooked food and less restaurant, buffet, and fast food.  When eating at a restaurant, ask that your food be prepared with less salt or no salt, if possible. What foods are recommended? The items listed may not be a complete list. Talk with your dietitian about what   dietary choices are best for you. Grains Whole-grain or whole-wheat bread. Whole-grain or whole-wheat pasta. Brown rice. Oatmeal. Quinoa. Bulgur. Whole-grain and low-sodium cereals. Pita bread. Low-fat, low-sodium crackers. Whole-wheat flour tortillas. Vegetables Fresh or frozen vegetables (raw, steamed, roasted, or grilled). Low-sodium or reduced-sodium tomato and vegetable juice. Low-sodium or reduced-sodium tomato sauce and tomato paste. Low-sodium or reduced-sodium canned vegetables. Fruits All fresh, dried, or frozen fruit. Canned fruit in natural juice (without  added sugar). Meat and other protein foods Skinless chicken or turkey. Ground chicken or turkey. Pork with fat trimmed off. Fish and seafood. Egg whites. Dried beans, peas, or lentils. Unsalted nuts, nut butters, and seeds. Unsalted canned beans. Lean cuts of beef with fat trimmed off. Low-sodium, lean deli meat. Dairy Low-fat (1%) or fat-free (skim) milk. Fat-free, low-fat, or reduced-fat cheeses. Nonfat, low-sodium ricotta or cottage cheese. Low-fat or nonfat yogurt. Low-fat, low-sodium cheese. Fats and oils Soft margarine without trans fats. Vegetable oil. Low-fat, reduced-fat, or light mayonnaise and salad dressings (reduced-sodium). Canola, safflower, olive, soybean, and sunflower oils. Avocado. Seasoning and other foods Herbs. Spices. Seasoning mixes without salt. Unsalted popcorn and pretzels. Fat-free sweets. What foods are not recommended? The items listed may not be a complete list. Talk with your dietitian about what dietary choices are best for you. Grains Baked goods made with fat, such as croissants, muffins, or some breads. Dry pasta or rice meal packs. Vegetables Creamed or fried vegetables. Vegetables in a cheese sauce. Regular canned vegetables (not low-sodium or reduced-sodium). Regular canned tomato sauce and paste (not low-sodium or reduced-sodium). Regular tomato and vegetable juice (not low-sodium or reduced-sodium). Pickles. Olives. Fruits Canned fruit in a light or heavy syrup. Fried fruit. Fruit in cream or butter sauce. Meat and other protein foods Fatty cuts of meat. Ribs. Fried meat. Bacon. Sausage. Bologna and other processed lunch meats. Salami. Fatback. Hotdogs. Bratwurst. Salted nuts and seeds. Canned beans with added salt. Canned or smoked fish. Whole eggs or egg yolks. Chicken or turkey with skin. Dairy Whole or 2% milk, cream, and half-and-half. Whole or full-fat cream cheese. Whole-fat or sweetened yogurt. Full-fat cheese. Nondairy creamers. Whipped toppings.  Processed cheese and cheese spreads. Fats and oils Butter. Stick margarine. Lard. Shortening. Ghee. Bacon fat. Tropical oils, such as coconut, palm kernel, or palm oil. Seasoning and other foods Salted popcorn and pretzels. Onion salt, garlic salt, seasoned salt, table salt, and sea salt. Worcestershire sauce. Tartar sauce. Barbecue sauce. Teriyaki sauce. Soy sauce, including reduced-sodium. Steak sauce. Canned and packaged gravies. Fish sauce. Oyster sauce. Cocktail sauce. Horseradish that you find on the shelf. Ketchup. Mustard. Meat flavorings and tenderizers. Bouillon cubes. Hot sauce and Tabasco sauce. Premade or packaged marinades. Premade or packaged taco seasonings. Relishes. Regular salad dressings. Where to find more information:  National Heart, Lung, and Blood Institute: www.nhlbi.nih.gov  American Heart Association: www.heart.org Summary  The DASH eating plan is a healthy eating plan that has been shown to reduce high blood pressure (hypertension). It may also reduce your risk for type 2 diabetes, heart disease, and stroke.  With the DASH eating plan, you should limit salt (sodium) intake to 2,300 mg a day. If you have hypertension, you may need to reduce your sodium intake to 1,500 mg a day.  When on the DASH eating plan, aim to eat more fresh fruits and vegetables, whole grains, lean proteins, low-fat dairy, and heart-healthy fats.  Work with your health care provider or diet and nutrition specialist (dietitian) to adjust your eating plan to your individual   calorie needs. This information is not intended to replace advice given to you by your health care provider. Make sure you discuss any questions you have with your health care provider. Document Released: 04/30/2011 Document Revised: 05/04/2016 Document Reviewed: 05/04/2016 Elsevier Interactive Patient Education  2017 Elsevier Inc.  

## 2016-12-28 NOTE — Assessment & Plan Note (Signed)
Mild depression from wife's death anniversary Chronic mild anxiety---wants a few alprazolam

## 2016-12-28 NOTE — Addendum Note (Signed)
Addended by: Pilar Grammes on: 12/28/2016 04:39 PM   Modules accepted: Orders

## 2016-12-28 NOTE — Assessment & Plan Note (Signed)
No problems with statin Will check lipid levels

## 2016-12-28 NOTE — Assessment & Plan Note (Signed)
I have personally reviewed the Medicare Annual Wellness questionnaire and have noted 1. The patient's medical and social history 2. Their use of alcohol, tobacco or illicit drugs 3. Their current medications and supplements 4. The patient's functional ability including ADL's, fall risks, home safety risks and hearing or visual             impairment. 5. Diet and physical activities 6. Evidence for depression or mood disorders  The patients weight, height, BMI and visual acuity have been recorded in the chart I have made referrals, counseling and provided education to the patient based review of the above and I have provided the pt with a written personalized care plan for preventive services.  I have provided you with a copy of your personalized plan for preventive services. Please take the time to review along with your updated medication list.  DASH info given Pneumovax booster Will consider shingrix Discussed fitness Not ready for colon--will do FIT Had abd CT 2010---doesn't need aorta screen Soft MR murmur---will just follow clinically

## 2016-12-28 NOTE — Assessment & Plan Note (Signed)
Still controlled with his regimen

## 2016-12-28 NOTE — Progress Notes (Signed)
Subjective:    Patient ID: Dale Mcdonald, male    DOB: 13-Feb-1951, 66 y.o.   MRN: [id number]  HPI Here for initial Medicare preventative exam and follow up of chronic health conditions Reviewed form and advanced directives Reviewed other doctors--- [facility], Dr Dale Mcdonald,  Still smoking --using the chantix again and has cut back by at least 1/2. Counseled No alcohol Tries to exercise some--[place] [facility] 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 No injury Vision is okay Hearing is okay. Test here shows threshold of 25 db on right at 500, 1000 Hz and on left at 500 and 4000Hz . 20 dB on right at 2000, 4000 and left at 2000Hz . 40 dB threshold at 1000Hz  on left Occasional depression--mostly just at wife's death anniversary. Not anhedonic. Independent with instrumental ADLs No apparent memory problems  Having some ongoing mood issues Still a hard time since he lost his wife 2 years ago Rough time this July--anniversary of her death Wants some alprazolam for the high anxiety days  Still undetectable HIV levels Continues with DR Dale Mcdonald Still with chronic hep B infection--no separate Rx for this  No problems with BP Checks fairly regularly Usually 128/78 or so No chest pain No SOB No dizziness or syncope No edema  No problems with statin No myalgias or GI problems  Current Outpatient Prescriptions on File Prior to Visit  Medication Sig Dispense Refill  . amLODipine (NORVASC) 5 MG tablet Take 1 tablet (5 mg total) by mouth daily. 90 tablet 3  . aspirin EC 81 MG tablet Take 81 mg by mouth daily.    Marland Kitchen atorvastatin (LIPITOR) 20 MG tablet TAKE 1 TABLET (20 MG TOTAL) BY MOUTH DAILY. 90 tablet 1  . lisinopril-hydrochlorothiazide (PRINZIDE,ZESTORETIC) 20-25 MG tablet Take 1 tablet by mouth daily. 90 tablet 3  . varenicline (CHANTIX) 1 MG tablet Take 1 tablet (1 mg total) by mouth 2 (two) times daily. 60  tablet 5  . dolutegravir (TIVICAY) 50 MG tablet Take 1 tablet (50 mg total) by mouth daily. (Patient not taking: Reported on 12/28/2016) 30 tablet 5  . emtricitabine-tenofovir AF (DESCOVY) 200-25 MG tablet Take 1 tablet by mouth daily. (Patient not taking: Reported on 12/28/2016) 30 tablet 5   No current facility-administered medications on file prior to visit.     No Known Allergies  Past Medical History:  Diagnosis Date  . Anxiety   . HIV (human immunodeficiency virus infection) ([place])   . Hyperlipidemia   . Hypertension     No past surgical history on file.  No family history on file.  Social History   Social History  . Marital status: Widowed    Spouse name: N/A  . Number of children: 1  . Years of education: N/A   Occupational History  . [employer]   Social History Main Topics  . Smoking status: Current Every Day Smoker    Packs/day: 1.00    Types: Cigarettes  . Smokeless tobacco: Never Used     Comment: gave 1-800-QUIT-NOW, currently on Chantix  . Alcohol use No  . Drug use: No  . Sexual activity: Not on file   Other Topics Concern  . Not on file   Social History Narrative   No living will   Would want son Dale Mcdonald to be decision maker   Would accept resuscitation   Not sure about tube feeds  Review of Systems Has had lots of dental work--implant failure, bone graft, etc Appetite is okay Weight is stable Wears seat belt Bowels are okay Generally sleeps okay Voids okay. Nocturia x 1 and stable No sig back or joint pains--did pull his back out in December. Did finally resolve No skin problems--some easy bruising with the aspirin No heartburn or dysphagia     Objective:   Physical Exam  Constitutional: He is oriented to person, place, and time. He appears well-nourished. No distress.  HENT:  Mouth/Throat: Oropharynx is clear and moist. No oropharyngeal exudate.  Neck: No thyromegaly present.    Cardiovascular: Normal rate, regular rhythm and intact distal pulses.  Exam reveals no gallop.   Soft MR murmur  Pulmonary/Chest: Effort normal and breath sounds normal. No respiratory distress. He has no wheezes. He has no rales.  Abdominal: Soft. There is no tenderness.  Musculoskeletal: He exhibits no edema or tenderness.  Lymphadenopathy:    He has no cervical adenopathy.  Neurological: He is alert and oriented to person, place, and time.  President--- "Dwaine Deter, Bush" [phone] D-l-r-[id number]-w Recall 3/3  Skin: No rash noted. No erythema.  Psychiatric: He has a normal mood and affect. His behavior is normal.          Assessment & Plan:

## 2016-12-28 NOTE — Assessment & Plan Note (Signed)
BP Readings from Last 3 Encounters:  12/28/16 116/76  09/09/16 134/81  06/25/16 134/74   Good control Will check CBC, met panel

## 2016-12-29 LAB — CBC WITH DIFFERENTIAL/PLATELET
BASOS ABS: 0.1 10*3/uL (ref 0.0–0.1)
Basophils Relative: 1.2 % (ref 0.0–3.0)
EOS PCT: 2.1 % (ref 0.0–5.0)
Eosinophils Absolute: 0.2 10*3/uL (ref 0.0–0.7)
HCT: 43.2 % (ref 39.0–52.0)
Hemoglobin: 14.8 g/dL (ref 13.0–17.0)
LYMPHS ABS: 2.9 10*3/uL (ref 0.7–4.0)
Lymphocytes Relative: 38.3 % (ref 12.0–46.0)
MCHC: 34.3 g/dL (ref 30.0–36.0)
MCV: 94.5 fl (ref 78.0–100.0)
MONO ABS: 0.5 10*3/uL (ref 0.1–1.0)
MONOS PCT: 7.3 % (ref 3.0–12.0)
NEUTROS ABS: 3.9 10*3/uL (ref 1.4–7.7)
NEUTROS PCT: 51.1 % (ref 43.0–77.0)
Platelets: 240 10*3/uL (ref 150.0–400.0)
RBC: 4.57 Mil/uL (ref 4.22–5.81)
RDW: 13.8 % (ref 11.5–15.5)
WBC: 7.5 10*3/uL (ref 4.0–10.5)

## 2016-12-29 LAB — COMPREHENSIVE METABOLIC PANEL
ALK PHOS: 98 U/L (ref 39–117)
ALT: 33 U/L (ref 0–53)
AST: 26 U/L (ref 0–37)
Albumin: 4.4 g/dL (ref 3.5–5.2)
BILIRUBIN TOTAL: 0.4 mg/dL (ref 0.2–1.2)
BUN: 14 mg/dL (ref 6–23)
CO2: 28 meq/L (ref 19–32)
Calcium: 9.6 mg/dL (ref 8.4–10.5)
Chloride: 98 mEq/L (ref 96–112)
Creatinine, Ser: 0.97 mg/dL (ref 0.40–1.50)
GFR: 82.45 mL/min (ref >60.00)
GLUCOSE: 92 mg/dL (ref 70–99)
Potassium: 3.5 mEq/L (ref 3.5–5.1)
SODIUM: 135 meq/L (ref 135–145)
TOTAL PROTEIN: 7.3 g/dL (ref 6.0–8.3)

## 2016-12-29 LAB — LDL CHOLESTEROL, DIRECT: Direct LDL: 79 mg/dL

## 2016-12-29 LAB — LIPID PANEL
CHOLESTEROL: 159 mg/dL (ref 0–200)
HDL: 47.2 mg/dL (ref >39.00)
NonHDL: 111.57
TRIGLYCERIDES: 209 mg/dL — AB (ref 0.0–149.0)
Total CHOL/HDL Ratio: 3
VLDL: 41.8 mg/dL — AB (ref 0.0–40.0)

## 2017-01-05 ENCOUNTER — Other Ambulatory Visit: Payer: Self-pay | Admitting: Family Medicine

## 2017-01-06 ENCOUNTER — Other Ambulatory Visit (INDEPENDENT_AMBULATORY_CARE_PROVIDER_SITE_OTHER): Payer: Medicare Other

## 2017-01-06 ENCOUNTER — Other Ambulatory Visit: Payer: Self-pay | Admitting: *Deleted

## 2017-01-06 DIAGNOSIS — Z1211 Encounter for screening for malignant neoplasm of colon: Secondary | ICD-10-CM

## 2017-01-06 LAB — FECAL OCCULT BLOOD, IMMUNOCHEMICAL: Fecal Occult Bld: NEGATIVE

## 2017-01-18 ENCOUNTER — Other Ambulatory Visit: Payer: Self-pay | Admitting: *Deleted

## 2017-01-18 DIAGNOSIS — B20 Human immunodeficiency virus [HIV] disease: Secondary | ICD-10-CM

## 2017-01-18 MED ORDER — EMTRICITABINE-TENOFOVIR AF 200-25 MG PO TABS: 1.0000 | ORAL_TABLET | Freq: Every day | ORAL | 5 refills | Status: DC

## 2017-01-18 MED ORDER — DOLUTEGRAVIR SODIUM 50 MG PO TABS: 50.0000 mg | ORAL_TABLET | Freq: Every day | ORAL | 5 refills | Status: DC

## 2017-02-03 ENCOUNTER — Encounter: Payer: Self-pay | Admitting: Internal Medicine

## 2017-02-03 MED ORDER — VARENICLINE TARTRATE 1 MG PO TABS: 1.0000 mg | ORAL_TABLET | Freq: Two times a day (BID) | ORAL | 5 refills | Status: DC

## 2017-02-25 ENCOUNTER — Telehealth: Payer: Self-pay

## 2017-02-25 MED ORDER — ATORVASTATIN CALCIUM 20 MG PO TABS: 20.0000 mg | ORAL_TABLET | Freq: Every day | ORAL | 3 refills | Status: DC

## 2017-02-25 NOTE — Telephone Encounter (Signed)
[place] request rx for atorvastatin; could not find the rx sent 10/2016.Medication phoned to [facility] [place] as instructed.

## 2017-03-15 ENCOUNTER — Other Ambulatory Visit: Payer: Medicare Other

## 2017-03-15 DIAGNOSIS — B20 Human immunodeficiency virus [HIV] disease: Secondary | ICD-10-CM

## 2017-03-15 LAB — COMPLETE METABOLIC PANEL WITH GFR
AG Ratio: 1.5 (calc) (ref 1.0–2.5)
ALBUMIN MSPROF: 4.3 g/dL (ref 3.6–5.1)
ALKALINE PHOSPHATASE (APISO): 97 U/L (ref 40–115)
ALT: 33 U/L (ref 9–46)
AST: 27 U/L (ref 10–35)
BILIRUBIN TOTAL: 0.6 mg/dL (ref 0.2–1.2)
BUN: 9 mg/dL (ref 7–25)
CO2: 33 mmol/L — AB (ref 20–32)
CREATININE: 1.1 mg/dL (ref 0.70–1.25)
Calcium: 9.9 mg/dL (ref 8.6–10.3)
Chloride: 99 mmol/L (ref 98–110)
GFR, EST NON AFRICAN AMERICAN: 70 mL/min/{1.73_m2} (ref >=60)
GFR, Est African American: 81 mL/min/{1.73_m2} (ref >=60)
GLOBULIN: 2.8 g/dL (ref 1.9–3.7)
GLUCOSE: 177 mg/dL — AB (ref 65–99)
POTASSIUM: 4 mmol/L (ref 3.5–5.3)
Sodium: 138 mmol/L (ref 135–146)
Total Protein: 7.1 g/dL (ref 6.1–8.1)

## 2017-03-16 LAB — T-HELPER CELL (CD4) - (RCID CLINIC ONLY)
CD4 T CELL ABS: 1090 /uL (ref 400–2700)
CD4 T CELL HELPER: 42 % (ref 33–55)

## 2017-03-16 LAB — HEPATITIS C ANTIBODY
Hepatitis C Ab: NONREACTIVE
SIGNAL TO CUT-OFF: 0.01 (ref <1.00)

## 2017-03-17 LAB — HIV-1 RNA QUANT-NO REFLEX-BLD
HIV 1 RNA QUANT: NOT DETECTED {copies}/mL
HIV-1 RNA QUANT, LOG: NOT DETECTED {Log_copies}/mL

## 2017-03-29 ENCOUNTER — Encounter: Payer: Self-pay | Admitting: Infectious Disease

## 2017-03-29 ENCOUNTER — Ambulatory Visit: Payer: Medicare Other | Admitting: Infectious Disease

## 2017-03-29 ENCOUNTER — Other Ambulatory Visit: Payer: Self-pay | Admitting: Internal Medicine

## 2017-03-29 VITALS — Ht 70.0 in | Wt 195.0 lb

## 2017-03-29 DIAGNOSIS — I10 Essential (primary) hypertension: Secondary | ICD-10-CM

## 2017-03-29 DIAGNOSIS — B181 Chronic viral hepatitis B without delta-agent: Secondary | ICD-10-CM

## 2017-03-29 DIAGNOSIS — K5909 Other constipation: Secondary | ICD-10-CM

## 2017-03-29 DIAGNOSIS — B20 Human immunodeficiency virus [HIV] disease: Secondary | ICD-10-CM

## 2017-03-29 DIAGNOSIS — Z87891 Personal history of nicotine dependence: Secondary | ICD-10-CM

## 2017-03-29 DIAGNOSIS — F419 Anxiety disorder, unspecified: Secondary | ICD-10-CM | POA: Diagnosis not present

## 2017-03-29 DIAGNOSIS — E785 Hyperlipidemia, unspecified: Secondary | ICD-10-CM | POA: Diagnosis not present

## 2017-03-29 DIAGNOSIS — R635 Abnormal weight gain: Secondary | ICD-10-CM

## 2017-03-29 HISTORY — DX: Anxiety disorder, unspecified: F41.9

## 2017-03-29 NOTE — Progress Notes (Signed)
Subjective:   Chief complaint: Dale Mcdonald is concerned by recent weight gain and constipation and wonders if this is due to his new antiretroviral medication or due to Chantix, is also concerned that his primary care physician is reluctant to continue him on his benzodiazepine therapy for anxiety.   Patient ID: DELLAS GUARD, male    DOB: Aug 10, 1950, 66 y.o.   MRN: [id number]  HPI  66 year old male who is doing superbly well on his antiviral regimen of Atripla to which he had been changed from his prior regimen of Reyataz Norvir Truvada which he had received through AIDS clinical trials group 5257 to Atripla.  His been enrolled in HALO study in the ACTG.  We tried to change him to [place] but his insurance would not cover this. He believes his insurance will now covert his. We considered change to [facility] but there are signficant drug drug interactions due to COBI so he was in the end agreeable to going with Tivicay and Descovy with consideration of Biktarvy down the road.  He is here for follow-up today with repeat viral load after having switched to [place] a month ago (we made the decision a year ago but he had almost a year of Atripla that he did not wish to waste)  He is to be tolerating medication well although he has had constipation and weight gain.  I suspect that the weight gain is due to him not smoking tobacco and taking away the appetite suppression of nicotine.     Lab Results  Component Value Date   HIV1RNAQUANT <20 NOT DETECTED 03/15/2017   HIV1RNAQUANT <40 02/24/2016   HIV1RNAQUANT <20 12/21/2013    Lab Results  Component Value Date   CD4TABS 1,090 03/15/2017   CD4TABS 926 02/24/2016   CD4TABS 629 04/01/2015   Past Medical History:  Diagnosis Date  . Anxiety   . HIV (human immunodeficiency virus infection) ([place])   . Hyperlipidemia   . Hypertension     No past surgical history on file.  No family history on file.    Social History    Socioeconomic History  . Marital status: Widowed    Spouse name: None  . Number of children: 1  . Years of education: None  . Highest education level: None  Social Needs  . Financial resource strain: None  . Food insecurity - worry: None  . Food insecurity - inability: None  . Transportation needs - medical: None  . Transportation needs - non-medical: None  Occupational History  . 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 Smoking status: Current Every Day Smoker    Packs/day: 1.00    Types: Cigarettes  . Smokeless tobacco: Never Used  . Tobacco comment: gave 1-800-QUIT-NOW, currently on Chantix  Substance and Sexual Activity  . Alcohol use: No    Alcohol/week: 0.0 oz  . Drug use: No  . Sexual activity: None  Other Topics Concern  . None  Social History Narrative   No living will   Would want son [name] to be Media planner   Would accept resuscitation   Not sure about tube feeds    No Known Allergies   Current Outpatient Medications:  .  amLODipine (NORVASC) 5 MG tablet, Take 1 tablet (5 mg total) by mouth daily., Disp: 90 tablet, Rfl: 3 .  aspirin EC 81 MG tablet, Take 81 mg by mouth daily., Disp: , Rfl:  .  atorvastatin (LIPITOR)  20 MG tablet, Take 1 tablet (20 mg total) by mouth daily., Disp: 90 tablet, Rfl: 3 .  dolutegravir (TIVICAY) 50 MG tablet, Take 1 tablet (50 mg total) by mouth daily., Disp: 30 tablet, Rfl: 5 .  emtricitabine-tenofovir AF (DESCOVY) 200-25 MG tablet, Take 1 tablet by mouth daily., Disp: 30 tablet, Rfl: 5 .  lisinopril-hydrochlorothiazide (PRINZIDE,ZESTORETIC) 20-25 MG tablet, Take 1 tablet by mouth daily., Disp: 90 tablet, Rfl: 3 .  varenicline (CHANTIX) 1 MG tablet, Take 1 tablet (1 mg total) by mouth 2 (two) times daily., Disp: 60 tablet, Rfl: 5 .  ALPRAZolam (XANAX) 0.25 MG tablet, Take 1 tablet (0.25 mg total) by mouth 2 (two) times daily as needed for anxiety. (Patient not  taking: Reported on 03/29/2017), Disp: 30 tablet, Rfl: 0     Review of Systems  Constitutional: Positive for unexpected weight change. Negative for activity change, appetite change, chills, diaphoresis, fatigue and fever.  HENT: Negative for congestion, rhinorrhea, sinus pressure, sneezing, sore throat and trouble swallowing.   Eyes: Negative for photophobia and visual disturbance.  Respiratory: Negative for cough, chest tightness, shortness of breath, wheezing and stridor.   Cardiovascular: Negative for chest pain, palpitations and leg swelling.  Gastrointestinal: Positive for constipation. Negative for abdominal distention, abdominal pain, anal bleeding, blood in stool, diarrhea, nausea and vomiting.  Genitourinary: Negative for difficulty urinating, dysuria, flank pain and hematuria.  Musculoskeletal: Negative for arthralgias, back pain, gait problem, joint swelling and myalgias.  Skin: Negative for color change, pallor, rash and wound.  Neurological: Negative for dizziness, tremors, weakness and light-headedness.  Hematological: Negative for adenopathy. Does not bruise/bleed easily.  Psychiatric/Behavioral: Negative for agitation, behavioral problems, confusion, decreased concentration, dysphoric mood and sleep disturbance. The patient is nervous/anxious.        Objective:   Physical Exam  Constitutional: He is oriented to person, place, and time. He appears well-developed and well-nourished. No distress.  HENT:  Head: Normocephalic and atraumatic.  Mouth/Throat: Oropharynx is clear and moist. No oropharyngeal exudate.  Eyes: Conjunctivae and EOM are normal. Pupils are equal, round, and reactive to light. No scleral icterus.  Neck: Normal range of motion. Neck supple. No JVD present.  Cardiovascular: Normal rate, regular rhythm and normal heart sounds. Exam reveals no gallop and no friction rub.  No murmur heard. Pulmonary/Chest: Effort normal and breath sounds normal. No  respiratory distress. He has no wheezes. He has no rales. He exhibits no tenderness.  Abdominal: He exhibits no distension and no mass. There is no tenderness. There is no rebound and no guarding.  Musculoskeletal: He exhibits no edema or tenderness.  Lymphadenopathy:    He has no cervical adenopathy.  Neurological: He is alert and oriented to person, place, and time. He has normal reflexes. He exhibits normal muscle tone. Coordination normal.  Skin: Skin is warm and dry. He is not diaphoretic. No erythema. No pallor.  Psychiatric: He has a normal mood and affect. His behavior is normal. Judgment and thought content normal.          Assessment & Plan:   HIV: continue  Tivicay and Descovy, when he is running out of these meds we can switch him to STR, Biktarvy  Chronic Hepatitis B without hepatic coma or delta agent: needs to have [place] of liver regularly  Hyperlipidemia continue statin.  Hypertension:  PCP managing this  Anxiety: encouraged him to seek Psychiatrist re management and if he needs benzodiazpenes which he says help him a lot though he only takes intermittently  Weight gain: may benefit from carb restricted diet. He may be on verge of DM2  Smoking: proud of him for quitting  I spent greater than 25 minutes with the patient including greater than 50% of time in face to face counsel of the patient and in coordination of his care with specific counselilng re why we had changed prior regimen, review of potential side effects review of option to change to BIKTARVY review of all of this labs and counsel re how to lose weight with diet and exercise.

## 2017-03-30 ENCOUNTER — Other Ambulatory Visit: Payer: Self-pay | Admitting: Internal Medicine

## 2017-04-27 ENCOUNTER — Encounter (INDEPENDENT_AMBULATORY_CARE_PROVIDER_SITE_OTHER): Payer: Self-pay | Admitting: *Deleted

## 2017-04-27 VITALS — BP 106/69 | HR 49 | Temp 97.7°F | Wt 195.2 lb

## 2017-04-27 DIAGNOSIS — Z006 Encounter for examination for normal comparison and control in clinical research program: Secondary | ICD-10-CM

## 2017-04-27 NOTE — Progress Notes (Signed)
Domenik is here for week 240 visit A5322, HAILO Study: A Long Term follow-up of Older HIV-Infected Adults in the ACTG, an observational study addressing the issues of aging, HIV infection and Inflammation. Denies any new problems or concerns. Has started working out at the gym 4-5 times a week. BP and heart rate a little low today. Denied feeling lightheaded or dizzy. He said he has been monitoring his BP and has noticed that it has been lower since he started exercising. Agreed to follow up with his PCP to see if his BP medication need any adjusting. He will return in March for his next study visit.

## 2017-04-28 LAB — COMPREHENSIVE METABOLIC PANEL
AG Ratio: 1.4 (calc) (ref 1.0–2.5)
ALBUMIN MSPROF: 4.3 g/dL (ref 3.6–5.1)
ALKALINE PHOSPHATASE (APISO): 93 U/L (ref 40–115)
ALT: 29 U/L (ref 9–46)
AST: 32 U/L (ref 10–35)
BILIRUBIN TOTAL: 0.5 mg/dL (ref 0.2–1.2)
BUN: 11 mg/dL (ref 7–25)
CALCIUM: 9.8 mg/dL (ref 8.6–10.3)
CHLORIDE: 101 mmol/L (ref 98–110)
CO2: 28 mmol/L (ref 20–32)
Creat: 1.12 mg/dL (ref 0.70–1.25)
Globulin: 3.1 g/dL (calc) (ref 1.9–3.7)
Glucose, Bld: 116 mg/dL — ABNORMAL HIGH (ref 65–99)
POTASSIUM: 3.7 mmol/L (ref 3.5–5.3)
Sodium: 137 mmol/L (ref 135–146)
Total Protein: 7.4 g/dL (ref 6.1–8.1)

## 2017-04-28 LAB — CBC WITH DIFFERENTIAL/PLATELET
Basophils Absolute: 111 cells/uL (ref 0–200)
Basophils Relative: 1.7 %
Eosinophils Absolute: 189 cells/uL (ref 15–500)
Eosinophils Relative: 2.9 %
HCT: 42.2 % (ref 38.5–50.0)
Hemoglobin: 14.8 g/dL (ref 13.2–17.1)
Lymphs Abs: 2444 cells/uL (ref 850–3900)
MCH: 31.9 pg (ref 27.0–33.0)
MCHC: 35.1 g/dL (ref 32.0–36.0)
MCV: 90.9 fL (ref 80.0–100.0)
MPV: 10.6 fL (ref 7.5–12.5)
Monocytes Relative: 10 %
Neutro Abs: 3107 cells/uL (ref 1500–7800)
Neutrophils Relative %: 47.8 %
Platelets: 255 10*3/uL (ref 140–400)
RBC: 4.64 10*6/uL (ref 4.20–5.80)
RDW: 12.6 % (ref 11.0–15.0)
Total Lymphocyte: 37.6 %
WBC mixed population: 650 cells/uL (ref 200–950)
WBC: 6.5 10*3/uL (ref 3.8–10.8)

## 2017-04-28 LAB — PROTEIN / CREATININE RATIO, URINE
Creatinine, Urine: 46 mg/dL (ref 20–320)
Protein/Creat Ratio: 130 mg/g creat — ABNORMAL HIGH (ref 22–128)
Total Protein, Urine: 6 mg/dL (ref 5–25)

## 2017-04-28 LAB — LIPID PANEL
Cholesterol: 128 mg/dL (ref <200)
HDL: 40 mg/dL — ABNORMAL LOW (ref >40)
LDL Cholesterol (Calc): 73 mg/dL (calc)
Non-HDL Cholesterol (Calc): 88 mg/dL (calc) (ref <130)
Total CHOL/HDL Ratio: 3.2 (calc) (ref <5.0)
Triglycerides: 69 mg/dL (ref <150)

## 2017-04-28 LAB — HEMOGLOBIN A1C
Hgb A1c MFr Bld: 5.7 % of total Hgb — ABNORMAL HIGH (ref <5.7)
Mean Plasma Glucose: 117 (calc)
eAG (mmol/L): 6.5 (calc)

## 2017-05-27 ENCOUNTER — Encounter: Payer: Self-pay | Admitting: Internal Medicine

## 2017-06-01 MED ORDER — AMLODIPINE BESYLATE 5 MG PO TABS: ORAL_TABLET | ORAL | 3 refills | Status: DC

## 2017-06-01 MED ORDER — LISINOPRIL-HYDROCHLOROTHIAZIDE 20-25 MG PO TABS: 1.0000 | ORAL_TABLET | Freq: Every day | ORAL | 3 refills | Status: DC

## 2017-06-01 MED ORDER — ATORVASTATIN CALCIUM 20 MG PO TABS: 20.0000 mg | ORAL_TABLET | Freq: Every day | ORAL | 3 refills | Status: DC

## 2017-07-06 DIAGNOSIS — H33312 Horseshoe tear of retina without detachment, left eye: Secondary | ICD-10-CM | POA: Diagnosis not present

## 2017-07-07 DIAGNOSIS — H33002 Unspecified retinal detachment with retinal break, left eye: Secondary | ICD-10-CM | POA: Diagnosis not present

## 2017-07-07 DIAGNOSIS — E785 Hyperlipidemia, unspecified: Secondary | ICD-10-CM | POA: Diagnosis not present

## 2017-07-07 DIAGNOSIS — I10 Essential (primary) hypertension: Secondary | ICD-10-CM | POA: Diagnosis not present

## 2017-07-07 DIAGNOSIS — H43812 Vitreous degeneration, left eye: Secondary | ICD-10-CM | POA: Diagnosis not present

## 2017-07-08 DIAGNOSIS — H33002 Unspecified retinal detachment with retinal break, left eye: Secondary | ICD-10-CM | POA: Diagnosis not present

## 2017-07-13 DIAGNOSIS — H3322 Serous retinal detachment, left eye: Secondary | ICD-10-CM | POA: Diagnosis not present

## 2017-07-30 ENCOUNTER — Other Ambulatory Visit: Payer: Self-pay | Admitting: Infectious Disease

## 2017-07-30 DIAGNOSIS — B20 Human immunodeficiency virus [HIV] disease: Secondary | ICD-10-CM

## 2017-08-09 ENCOUNTER — Encounter (INDEPENDENT_AMBULATORY_CARE_PROVIDER_SITE_OTHER): Payer: Medicare Other | Admitting: *Deleted

## 2017-08-09 VITALS — BP 145/82 | HR 80 | Temp 98.4°F | Wt 188.0 lb

## 2017-08-09 DIAGNOSIS — Z006 Encounter for examination for normal comparison and control in clinical research program: Secondary | ICD-10-CM

## 2017-08-09 NOTE — Progress Notes (Signed)
Dale Mcdonald is here for his week 49 study visit for A5322. He has a patch over his left eye. He said he had a detached retina which required surgery in February. He started having floaters in it then he noticed his vision going black in certain areas. He denies any other problems. He will be returning in October for the next study visit and seeing Dr. Tommy Medal in November.

## 2017-08-16 DIAGNOSIS — H3322 Serous retinal detachment, left eye: Secondary | ICD-10-CM | POA: Diagnosis not present

## 2017-08-25 LAB — HIV-1 RNA QUANT-NO REFLEX-BLD: HIV-1 RNA Viral Load: <40

## 2017-09-01 ENCOUNTER — Encounter: Payer: Self-pay | Admitting: *Deleted

## 2017-09-20 DIAGNOSIS — J069 Acute upper respiratory infection, unspecified: Secondary | ICD-10-CM | POA: Diagnosis not present

## 2017-09-21 DIAGNOSIS — H3322 Serous retinal detachment, left eye: Secondary | ICD-10-CM | POA: Diagnosis not present

## 2017-09-27 ENCOUNTER — Encounter: Payer: Self-pay | Admitting: Internal Medicine

## 2017-09-27 DIAGNOSIS — H25042 Posterior subcapsular polar age-related cataract, left eye: Secondary | ICD-10-CM | POA: Diagnosis not present

## 2017-09-27 MED ORDER — ATORVASTATIN CALCIUM 20 MG PO TABS: 20.0000 mg | ORAL_TABLET | Freq: Every day | ORAL | 2 refills | Status: DC

## 2017-09-27 NOTE — Addendum Note (Signed)
Addended by: Pilar Grammes on: 09/27/2017 11:11 AM   Modules accepted: Orders

## 2017-10-27 DIAGNOSIS — H25042 Posterior subcapsular polar age-related cataract, left eye: Secondary | ICD-10-CM | POA: Diagnosis not present

## 2017-10-29 ENCOUNTER — Other Ambulatory Visit: Payer: Self-pay | Admitting: *Deleted

## 2017-10-29 ENCOUNTER — Encounter: Payer: Self-pay | Admitting: Infectious Disease

## 2017-10-29 DIAGNOSIS — B009 Herpesviral infection, unspecified: Secondary | ICD-10-CM

## 2017-10-29 MED ORDER — VALACYCLOVIR HCL 500 MG PO TABS: 500.0000 mg | ORAL_TABLET | Freq: Every day | ORAL | 11 refills | Status: DC

## 2017-11-03 ENCOUNTER — Encounter: Payer: Self-pay | Admitting: *Deleted

## 2017-11-11 ENCOUNTER — Encounter
Admission: RE | Disposition: A | Discharge status: Home / Self Care | Payer: Self-pay | Source: Ambulatory Visit | Attending: Ophthalmology

## 2017-11-11 ENCOUNTER — Ambulatory Visit
Admission: RE | Admit: 2017-11-11 | Discharge: 2017-11-11 | Disposition: A | Discharge status: Home / Self Care | Payer: Medicare Other | Source: Ambulatory Visit | Attending: Ophthalmology | Admitting: Ophthalmology

## 2017-11-11 ENCOUNTER — Ambulatory Visit: Payer: Medicare Other | Admitting: Anesthesiology

## 2017-11-11 DIAGNOSIS — Z79899 Other long term (current) drug therapy: Secondary | ICD-10-CM | POA: Diagnosis not present

## 2017-11-11 DIAGNOSIS — F419 Anxiety disorder, unspecified: Secondary | ICD-10-CM | POA: Insufficient documentation

## 2017-11-11 DIAGNOSIS — E785 Hyperlipidemia, unspecified: Secondary | ICD-10-CM | POA: Diagnosis not present

## 2017-11-11 DIAGNOSIS — Z87891 Personal history of nicotine dependence: Secondary | ICD-10-CM | POA: Diagnosis not present

## 2017-11-11 DIAGNOSIS — Z21 Asymptomatic human immunodeficiency virus [HIV] infection status: Secondary | ICD-10-CM | POA: Diagnosis not present

## 2017-11-11 DIAGNOSIS — Z7982 Long term (current) use of aspirin: Secondary | ICD-10-CM | POA: Diagnosis not present

## 2017-11-11 DIAGNOSIS — B1910 Unspecified viral hepatitis B without hepatic coma: Secondary | ICD-10-CM | POA: Diagnosis not present

## 2017-11-11 DIAGNOSIS — I10 Essential (primary) hypertension: Secondary | ICD-10-CM | POA: Diagnosis not present

## 2017-11-11 DIAGNOSIS — E7800 Pure hypercholesterolemia, unspecified: Secondary | ICD-10-CM | POA: Diagnosis not present

## 2017-11-11 DIAGNOSIS — H2512 Age-related nuclear cataract, left eye: Secondary | ICD-10-CM | POA: Insufficient documentation

## 2017-11-11 DIAGNOSIS — R011 Cardiac murmur, unspecified: Secondary | ICD-10-CM | POA: Insufficient documentation

## 2017-11-11 DIAGNOSIS — H25042 Posterior subcapsular polar age-related cataract, left eye: Secondary | ICD-10-CM | POA: Diagnosis not present

## 2017-11-11 HISTORY — DX: Inflammatory liver disease, unspecified: K75.9

## 2017-11-11 HISTORY — PX: CATARACT EXTRACTION W/PHACO: SHX586

## 2017-11-11 HISTORY — DX: Cardiac murmur, unspecified: R01.1

## 2017-11-11 HISTORY — DX: Palpitations: R00.2

## 2017-11-11 SURGERY — PHACOEMULSIFICATION, CATARACT, WITH IOL INSERTION
Anesthesia: Monitor Anesthesia Care | Site: Eye | Laterality: Left | Wound class: "Clean "

## 2017-11-11 MED ORDER — LIDOCAINE HCL (PF) 4 % IJ SOLN
INTRAMUSCULAR | Status: AC
  Filled 2017-11-11: qty 5

## 2017-11-11 MED ORDER — POVIDONE-IODINE 5 % OP SOLN
OPHTHALMIC | Status: DC | PRN
  Administered 2017-11-11: 1 via OPHTHALMIC

## 2017-11-11 MED ORDER — NA CHONDROIT SULF-NA HYALURON 40-17 MG/ML IO SOLN
INTRAOCULAR | Status: DC | PRN
  Administered 2017-11-11: 1 mL via INTRAOCULAR

## 2017-11-11 MED ORDER — MOXIFLOXACIN HCL 0.5 % OP SOLN
OPHTHALMIC | Status: AC
  Filled 2017-11-11: qty 3

## 2017-11-11 MED ORDER — FENTANYL CITRATE (PF) 100 MCG/2ML IJ SOLN
INTRAMUSCULAR | Status: DC | PRN
  Administered 2017-11-11: 25 ug via INTRAVENOUS
  Administered 2017-11-11: 75 ug via INTRAVENOUS

## 2017-11-11 MED ORDER — FENTANYL CITRATE (PF) 100 MCG/2ML IJ SOLN
INTRAMUSCULAR | Status: AC
  Filled 2017-11-11: qty 2

## 2017-11-11 MED ORDER — SODIUM CHLORIDE 0.9 % IV SOLN
INTRAVENOUS | Status: DC
  Administered 2017-11-11: 09:00:00 via INTRAVENOUS

## 2017-11-11 MED ORDER — POVIDONE-IODINE 5 % OP SOLN
OPHTHALMIC | Status: AC
  Filled 2017-11-11: qty 30

## 2017-11-11 MED ORDER — ARMC OPHTHALMIC DILATING DROPS
OPHTHALMIC | Status: AC
  Administered 2017-11-11: 1 via OPHTHALMIC
  Filled 2017-11-11: qty 0.4

## 2017-11-11 MED ORDER — MOXIFLOXACIN HCL 0.5 % OP SOLN: 1.0000 [drp] | OPHTHALMIC | Status: DC | PRN

## 2017-11-11 MED ORDER — ARMC OPHTHALMIC DILATING DROPS
1.0000 "application " | OPHTHALMIC | Status: AC
  Administered 2017-11-11 (×3): 1 via OPHTHALMIC

## 2017-11-11 MED ORDER — NA CHONDROIT SULF-NA HYALURON 40-17 MG/ML IO SOLN
INTRAOCULAR | Status: AC
  Filled 2017-11-11: qty 1

## 2017-11-11 MED ORDER — MOXIFLOXACIN HCL 0.5 % OP SOLN
OPHTHALMIC | Status: DC | PRN
  Administered 2017-11-11: 0.2 mL via OPHTHALMIC

## 2017-11-11 MED ORDER — EPINEPHRINE PF 1 MG/ML IJ SOLN
INTRAOCULAR | Status: DC | PRN
  Administered 2017-11-11: 09:00:00 via OPHTHALMIC

## 2017-11-11 MED ORDER — EPINEPHRINE PF 1 MG/ML IJ SOLN
INTRAMUSCULAR | Status: AC
  Filled 2017-11-11: qty 2

## 2017-11-11 MED ORDER — CARBACHOL 0.01 % IO SOLN
INTRAOCULAR | Status: DC | PRN
  Administered 2017-11-11: 0.5 mL via INTRAOCULAR

## 2017-11-11 MED ORDER — LIDOCAINE HCL (PF) 4 % IJ SOLN
INTRAOCULAR | Status: DC | PRN
  Administered 2017-11-11: 4 mL via OPHTHALMIC

## 2017-11-11 SURGICAL SUPPLY — 16 items
GLOVE BIO SURGEON STRL SZ8 (GLOVE) ×2 IMPLANT
GLOVE BIOGEL M 6.5 STRL (GLOVE) ×2 IMPLANT
GLOVE SURG LX 8.0 MICRO (GLOVE) ×1
GLOVE SURG LX STRL 8.0 MICRO (GLOVE) ×1 IMPLANT
GOWN STRL REUS W/ TWL LRG LVL3 (GOWN DISPOSABLE) ×2 IMPLANT
GOWN STRL REUS W/TWL LRG LVL3 (GOWN DISPOSABLE) ×2
LABEL CATARACT MEDS ST (LABEL) ×2 IMPLANT
LENS IOL TECNIS ITEC 14.0 (Intraocular Lens) ×1 IMPLANT
PACK CATARACT (MISCELLANEOUS) ×2 IMPLANT
PACK CATARACT BRASINGTON LX (MISCELLANEOUS) ×2 IMPLANT
PACK EYE AFTER SURG (MISCELLANEOUS) ×2 IMPLANT
SOL BSS BAG (MISCELLANEOUS) ×2
SOLUTION BSS BAG (MISCELLANEOUS) ×1 IMPLANT
SYR 5ML LL (SYRINGE) ×2 IMPLANT
WATER STERILE IRR 250ML POUR (IV SOLUTION) ×2 IMPLANT
WIPE NON LINTING 3.25X3.25 (MISCELLANEOUS) ×2 IMPLANT

## 2017-11-11 NOTE — Anesthesia Post-op Follow-up Note (Signed)
Anesthesia QCDR form completed.        

## 2017-11-11 NOTE — Op Note (Signed)
PREOPERATIVE DIAGNOSIS:  Nuclear sclerotic cataract of the left eye.   POSTOPERATIVE DIAGNOSIS:  Nuclear sclerotic cataract of the left eye.   OPERATIVE PROCEDURE: Procedure(s): CATARACT EXTRACTION PHACO AND INTRAOCULAR LENS PLACEMENT (IOC)   SURGEON:  Birder Robson, MD.   ANESTHESIA:  Anesthesiologist: Alphonsus Sias, MD CRNA: Demetrius Charity, CRNA  1.      Managed anesthesia care. 2.     0.66ml of Shugarcaine was instilled following the paracentesis   COMPLICATIONS:  None.   TECHNIQUE:   Stop and chop   DESCRIPTION OF PROCEDURE:  The patient was examined and consented in the preoperative holding area where the aforementioned topical anesthesia was applied to the left eye and then brought back to the Operating Room where the left eye was prepped and draped in the usual sterile ophthalmic fashion and a lid speculum was placed. A paracentesis was created with the side port blade and the anterior chamber was filled with viscoelastic. A near clear corneal incision was performed with the steel keratome. A continuous curvilinear capsulorrhexis was performed with a cystotome followed by the capsulorrhexis forceps. Hydrodissection and hydrodelineation were carried out with BSS on a blunt cannula. The lens was removed in a stop and chop  technique and the remaining cortical material was removed with the irrigation-aspiration handpiece. The capsular bag was inflated with viscoelastic and the Technis [id number] lens was placed in the capsular bag without complication. The remaining viscoelastic was removed from the eye with the irrigation-aspiration handpiece. The wounds were hydrated. The anterior chamber was flushed with Miostat and the eye was inflated to physiologic pressure. 0.27ml Vigamox was placed in the anterior chamber. The wounds were found to be water tight. The eye was dressed with Vigamox. The patient was given protective glasses to wear throughout the day and a shield with which to sleep  tonight. The patient was also given drops with which to begin a drop regimen today and will follow-up with me in one day. Implant Name Type Inv. Item Serial No. Manufacturer Lot No. LRB No. Used  LENS IOL DIOP 14.0 - [id number] Intraocular Lens LENS IOL DIOP 14.0 [id number] AMO  Left 1    Procedure(s) with comments: CATARACT EXTRACTION PHACO AND INTRAOCULAR LENS PLACEMENT (IOC) (Left) - [place] 00:59.3 AP% 19.5 CDE 11.54 Fluid pack lot # [id number]  Electronically signed: Birder Robson 11/11/2017 9:46 AM

## 2017-11-11 NOTE — Anesthesia Procedure Notes (Signed)
Performed by: Demetrius Charity, CRNA Pre-anesthesia Checklist: Patient identified, Emergency Drugs available, Suction available, Patient being monitored and Timeout performed Oxygen Delivery Method: Nasal cannula

## 2017-11-11 NOTE — Transfer of Care (Signed)
Immediate Anesthesia Transfer of Care Note  Patient: Dale Mcdonald  Procedure(s) Performed: CATARACT EXTRACTION PHACO AND INTRAOCULAR LENS PLACEMENT (IOC) (Left Eye)  Patient Location: PACU  Anesthesia Type:MAC  Level of Consciousness: awake, alert  and oriented  Airway & Oxygen Therapy: Patient Spontanous Breathing  Post-op Assessment: Report given to RN and Post -op Vital signs reviewed and stable  Post vital signs: Reviewed and stable  Last Vitals:  Vitals Value Taken Time  BP    Temp    Pulse    Resp    SpO2      Last Pain:  Vitals:   11/11/17 0822  TempSrc: Oral  PainSc: 0-No pain         Complications: No apparent anesthesia complications

## 2017-11-11 NOTE — Anesthesia Postprocedure Evaluation (Signed)
Anesthesia Post Note  Patient: Dale Mcdonald  Procedure(s) Performed: CATARACT EXTRACTION PHACO AND INTRAOCULAR LENS PLACEMENT (IOC) (Left Eye)  Patient location during evaluation: PACU Anesthesia Type: MAC Level of consciousness: awake and alert and oriented Pain management: satisfactory to patient Vital Signs Assessment: post-procedure vital signs reviewed and stable Respiratory status: respiratory function stable Cardiovascular status: stable Anesthetic complications: no     Last Vitals:  Vitals:   11/11/17 0822  BP: 119/71  Pulse: 86  Resp: 16  Temp: 36.6 C  SpO2: 100%    Last Pain:  Vitals:   11/11/17 0822  TempSrc: Oral  PainSc: 0-No pain                 Blima Singer

## 2017-11-11 NOTE — H&P (Signed)
All labs reviewed. Abnormal studies sent to patients PCP when indicated.  Previous H&P reviewed, patient examined, there are NO CHANGES.  [name]6/20/20199:14 AM

## 2017-11-11 NOTE — Anesthesia Preprocedure Evaluation (Signed)
Anesthesia Evaluation  Patient identified by MRN, date of birth, ID band Patient awake    Reviewed: Allergy & Precautions, H&P , NPO status , reviewed documented beta blocker date and time   Airway Mallampati: II  TM Distance: >3 FB Neck ROM: full    Dental  (+) Chipped   Pulmonary former smoker,    Pulmonary exam normal        Cardiovascular hypertension, Normal cardiovascular exam+ Valvular Problems/Murmurs   2010 ECHO Study Conclusions Left ventricle: The cavity size was normal. Wall thickness was normal. Systolic function was normal. The estimated ejection fraction was in the range of 55% to 60%. Regional wall motion abnormalities cannot be excluded.    Neuro/Psych PSYCHIATRIC DISORDERS Anxiety    GI/Hepatic (+) Hepatitis -  Endo/Other    Renal/GU      Musculoskeletal   Abdominal   Peds  Hematology   Anesthesia Other Findings Past Medical History: No date: Anxiety 03/29/2017: Anxiety No date: Heart murmur No date: Hepatitis     Comment:  B No date: HIV (human immunodeficiency virus infection) ([place]) No date: Hyperlipidemia No date: Hypertension No date: Palpitations  Past Surgical History: No date: EYE SURGERY     Comment:  RETINA No date: HERNIA REPAIR  BMI    Body Mass Index:  25.10 kg/m      Reproductive/Obstetrics                             Anesthesia Physical Anesthesia Plan  ASA: III  Anesthesia Plan: MAC   Post-op Pain Management:    Induction:   PONV Risk Score and Plan: Treatment may vary due to age or medical condition and TIVA  Airway Management Planned:   Additional Equipment:   Intra-op Plan:   Post-operative Plan:   Informed Consent: I have reviewed the patients History and Physical, chart, labs and discussed the procedure including the risks, benefits and alternatives for the proposed anesthesia with the patient or authorized  representative who has indicated his/her understanding and acceptance.   Dental Advisory Given  Plan Discussed with: CRNA  Anesthesia Plan Comments:         Anesthesia Quick Evaluation

## 2017-11-11 NOTE — Discharge Instructions (Signed)
Eye Surgery Discharge Instructions  Expect mild scratchy sensation or mild soreness. DO NOT RUB YOUR EYE!  The day of surgery:  Minimal physical activity, but bed rest is not required  No reading, computer work, or close hand work  No bending, lifting, or straining.  May watch TV  For 24 hours:  No driving, legal decisions, or alcoholic beverages  Safety precautions  Eat anything you prefer: It is better to start with liquids, then soup then solid foods.  _____ Eye patch should be worn until postoperative exam tomorrow.  ____ Solar shield eyeglasses should be worn for comfort in the sunlight/patch while sleeping  Resume all regular medications including aspirin or Coumadin if these were discontinued prior to surgery. You may shower, bathe, shave, or wash your hair. Tylenol may be taken for mild discomfort.  Call your doctor if you experience significant pain, nausea, or vomiting, fever > 101 or other signs of infection. [phone] or [phone] Specific instructions:  Follow-up Information    Birder Robson, MD Follow up.   Specialty:  Ophthalmology Why:  June 20 at 3:25pm Contact information: [address] [place] [address] [phone]

## 2017-11-12 ENCOUNTER — Encounter: Payer: Self-pay | Admitting: Ophthalmology

## 2017-12-13 ENCOUNTER — Encounter: Payer: Self-pay | Admitting: *Deleted

## 2017-12-13 DIAGNOSIS — H2511 Age-related nuclear cataract, right eye: Secondary | ICD-10-CM | POA: Diagnosis not present

## 2017-12-21 ENCOUNTER — Ambulatory Visit
Admission: RE | Admit: 2017-12-21 | Discharge: 2017-12-21 | Disposition: A | Discharge status: Home / Self Care | Payer: Medicare Other | Source: Ambulatory Visit | Attending: Ophthalmology | Admitting: Ophthalmology

## 2017-12-21 ENCOUNTER — Encounter: Payer: Self-pay | Admitting: Ophthalmology

## 2017-12-21 ENCOUNTER — Encounter
Admission: RE | Disposition: A | Discharge status: Home / Self Care | Payer: Self-pay | Source: Ambulatory Visit | Attending: Ophthalmology

## 2017-12-21 ENCOUNTER — Ambulatory Visit: Payer: Medicare Other | Admitting: Anesthesiology

## 2017-12-21 ENCOUNTER — Other Ambulatory Visit: Payer: Self-pay

## 2017-12-21 DIAGNOSIS — E785 Hyperlipidemia, unspecified: Secondary | ICD-10-CM | POA: Diagnosis not present

## 2017-12-21 DIAGNOSIS — H2511 Age-related nuclear cataract, right eye: Secondary | ICD-10-CM | POA: Insufficient documentation

## 2017-12-21 DIAGNOSIS — K219 Gastro-esophageal reflux disease without esophagitis: Secondary | ICD-10-CM | POA: Insufficient documentation

## 2017-12-21 DIAGNOSIS — F419 Anxiety disorder, unspecified: Secondary | ICD-10-CM | POA: Diagnosis not present

## 2017-12-21 DIAGNOSIS — Z7982 Long term (current) use of aspirin: Secondary | ICD-10-CM | POA: Diagnosis not present

## 2017-12-21 DIAGNOSIS — E7800 Pure hypercholesterolemia, unspecified: Secondary | ICD-10-CM | POA: Insufficient documentation

## 2017-12-21 DIAGNOSIS — I10 Essential (primary) hypertension: Secondary | ICD-10-CM | POA: Insufficient documentation

## 2017-12-21 DIAGNOSIS — B20 Human immunodeficiency virus [HIV] disease: Secondary | ICD-10-CM | POA: Diagnosis not present

## 2017-12-21 DIAGNOSIS — Z79899 Other long term (current) drug therapy: Secondary | ICD-10-CM | POA: Insufficient documentation

## 2017-12-21 HISTORY — PX: CATARACT EXTRACTION W/PHACO: SHX586

## 2017-12-21 SURGERY — PHACOEMULSIFICATION, CATARACT, WITH IOL INSERTION
Anesthesia: Monitor Anesthesia Care | Site: Eye | Laterality: Right | Wound class: "Clean "

## 2017-12-21 MED ORDER — PHENYLEPHRINE HCL 10 % OP SOLN
OPHTHALMIC | Status: AC
  Administered 2017-12-21: 1 [drp] via OPHTHALMIC
  Filled 2017-12-21: qty 5

## 2017-12-21 MED ORDER — CARBACHOL 0.01 % IO SOLN
INTRAOCULAR | Status: DC | PRN
  Administered 2017-12-21: .5 mL via INTRAOCULAR

## 2017-12-21 MED ORDER — POVIDONE-IODINE 5 % OP SOLN
OPHTHALMIC | Status: AC
  Filled 2017-12-21: qty 30

## 2017-12-21 MED ORDER — NA CHONDROIT SULF-NA HYALURON 40-17 MG/ML IO SOLN
INTRAOCULAR | Status: DC | PRN
  Administered 2017-12-21: 1 mL via INTRAOCULAR

## 2017-12-21 MED ORDER — MOXIFLOXACIN HCL 0.5 % OP SOLN
OPHTHALMIC | Status: DC | PRN
  Administered 2017-12-21: .2 mL via OPHTHALMIC

## 2017-12-21 MED ORDER — NA CHONDROIT SULF-NA HYALURON 40-17 MG/ML IO SOLN
INTRAOCULAR | Status: AC
  Filled 2017-12-21: qty 1

## 2017-12-21 MED ORDER — LIDOCAINE HCL (PF) 4 % IJ SOLN
INTRAMUSCULAR | Status: AC
  Filled 2017-12-21: qty 5

## 2017-12-21 MED ORDER — EPINEPHRINE PF 1 MG/ML IJ SOLN
INTRAMUSCULAR | Status: AC
  Filled 2017-12-21: qty 1

## 2017-12-21 MED ORDER — MIDAZOLAM HCL 2 MG/2ML IJ SOLN
INTRAMUSCULAR | Status: DC | PRN
  Administered 2017-12-21: 0.5 mg via INTRAVENOUS

## 2017-12-21 MED ORDER — PHENYLEPHRINE HCL 10 % OP SOLN
1.0000 [drp] | OPHTHALMIC | Status: AC
  Administered 2017-12-21 (×4): 1 [drp] via OPHTHALMIC

## 2017-12-21 MED ORDER — EPINEPHRINE PF 1 MG/ML IJ SOLN
INTRAOCULAR | Status: DC | PRN
  Administered 2017-12-21: 1 mL via OPHTHALMIC

## 2017-12-21 MED ORDER — TETRACAINE HCL 0.5 % OP SOLN
1.0000 [drp] | Freq: Once | OPHTHALMIC | Status: AC
  Administered 2017-12-21 (×2): 1 [drp] via OPHTHALMIC

## 2017-12-21 MED ORDER — SODIUM CHLORIDE 0.9 % IV SOLN
INTRAVENOUS | Status: DC
  Administered 2017-12-21 (×2): via INTRAVENOUS

## 2017-12-21 MED ORDER — TETRACAINE HCL 0.5 % OP SOLN
OPHTHALMIC | Status: AC
  Administered 2017-12-21: 1 [drp] via OPHTHALMIC
  Filled 2017-12-21: qty 4

## 2017-12-21 MED ORDER — CYCLOPENTOLATE HCL 2 % OP SOLN
OPHTHALMIC | Status: AC
  Administered 2017-12-21: 1 [drp] via OPHTHALMIC
  Filled 2017-12-21: qty 2

## 2017-12-21 MED ORDER — POVIDONE-IODINE 5 % OP SOLN
OPHTHALMIC | Status: DC | PRN
  Administered 2017-12-21: 1 via OPHTHALMIC

## 2017-12-21 MED ORDER — MOXIFLOXACIN HCL 0.5 % OP SOLN
OPHTHALMIC | Status: AC
  Filled 2017-12-21: qty 3

## 2017-12-21 MED ORDER — ONDANSETRON HCL 4 MG/2ML IJ SOLN: 4.0000 mg | Freq: Once | INTRAMUSCULAR | Status: DC | PRN

## 2017-12-21 MED ORDER — MOXIFLOXACIN HCL 0.5 % OP SOLN: 1.0000 [drp] | OPHTHALMIC | Status: DC | PRN

## 2017-12-21 MED ORDER — CYCLOPENTOLATE HCL 2 % OP SOLN
1.0000 [drp] | OPHTHALMIC | Status: DC
  Administered 2017-12-21 (×4): 1 [drp] via OPHTHALMIC

## 2017-12-21 MED ORDER — LIDOCAINE HCL (PF) 4 % IJ SOLN
INTRAOCULAR | Status: DC | PRN
  Administered 2017-12-21: 2 mL via OPHTHALMIC

## 2017-12-21 MED ORDER — FENTANYL CITRATE (PF) 100 MCG/2ML IJ SOLN
INTRAMUSCULAR | Status: DC | PRN
  Administered 2017-12-21: 50 ug via INTRAVENOUS

## 2017-12-21 SURGICAL SUPPLY — 16 items
GLOVE BIO SURGEON STRL SZ8 (GLOVE) ×2 IMPLANT
GLOVE BIOGEL M 6.5 STRL (GLOVE) ×2 IMPLANT
GLOVE SURG LX 8.0 MICRO (GLOVE) ×1
GLOVE SURG LX STRL 8.0 MICRO (GLOVE) ×1 IMPLANT
GOWN STRL REUS W/ TWL LRG LVL3 (GOWN DISPOSABLE) ×2 IMPLANT
GOWN STRL REUS W/TWL LRG LVL3 (GOWN DISPOSABLE) ×2
LABEL CATARACT MEDS ST (LABEL) ×2 IMPLANT
LENS IOL TECNIS ITEC 15.0 (Intraocular Lens) ×1 IMPLANT
PACK CATARACT (MISCELLANEOUS) ×2 IMPLANT
PACK CATARACT BRASINGTON LX (MISCELLANEOUS) ×2 IMPLANT
PACK EYE AFTER SURG (MISCELLANEOUS) ×2 IMPLANT
SOL BSS BAG (MISCELLANEOUS) ×2
SOLUTION BSS BAG (MISCELLANEOUS) ×1 IMPLANT
SYR 5ML LL (SYRINGE) ×2 IMPLANT
WATER STERILE IRR 250ML POUR (IV SOLUTION) ×2 IMPLANT
WIPE NON LINTING 3.25X3.25 (MISCELLANEOUS) ×2 IMPLANT

## 2017-12-21 NOTE — Discharge Instructions (Addendum)
Eye Surgery Discharge Instructions  Expect mild scratchy sensation or mild soreness. DO NOT RUB YOUR EYE!  The day of surgery:  Minimal physical activity, but bed rest is not required  No reading, computer work, or close hand work  No bending, lifting, or straining.  May watch TV  For 24 hours:  No driving, legal decisions, or alcoholic beverages  Safety precautions  Eat anything you prefer: It is better to start with liquids, then soup then solid foods.  Solar shield eyeglasses should be worn for comfort in the sunlight/patch while sleeping  Resume all regular medications including aspirin or Coumadin if these were discontinued prior to surgery. You may shower, bathe, shave, or wash your hair. Tylenol may be taken for mild discomfort. FOLLOW DR. PORFILIO'S POSTOP DISCHARGE INSTRUCTION SHEET AS REVIEWED.  Call your doctor if you experience significant pain, nausea, or vomiting, fever > 101 or other signs of infection. [phone] or [phone] Specific instructions:  Follow-up Information    Birder Robson, MD Follow up.   Specialty:  Ophthalmology Why:  12/22/17 @ 9:10 am Contact information: [address] [place] [address] [phone]

## 2017-12-21 NOTE — Op Note (Signed)
PREOPERATIVE DIAGNOSIS:  Nuclear sclerotic cataract of the right eye.   POSTOPERATIVE DIAGNOSIS:  nuclear sclerotic cataract right eye   OPERATIVE PROCEDURE: Procedure(s): CATARACT EXTRACTION PHACO AND INTRAOCULAR LENS PLACEMENT (IOC)   SURGEON:  Birder Robson, MD.   ANESTHESIA:  Anesthesiologist: Gunnar Fusi, MD CRNA: Bernardo Heater, CRNA  1.      Managed anesthesia care. 2.      0.63ml of Shugarcaine was instilled in the eye following the paracentesis.   COMPLICATIONS:  None.   TECHNIQUE:   Stop and chop   DESCRIPTION OF PROCEDURE:  The patient was examined and consented in the preoperative holding area where the aforementioned topical anesthesia was applied to the right eye and then brought back to the Operating Room where the right eye was prepped and draped in the usual sterile ophthalmic fashion and a lid speculum was placed. A paracentesis was created with the side port blade and the anterior chamber was filled with viscoelastic. A near clear corneal incision was performed with the steel keratome. A continuous curvilinear capsulorrhexis was performed with a cystotome followed by the capsulorrhexis forceps. Hydrodissection and hydrodelineation were carried out with BSS on a blunt cannula. The lens was removed in a stop and chop  technique and the remaining cortical material was removed with the irrigation-aspiration handpiece. The capsular bag was inflated with viscoelastic and the Technis [id number]  lens was placed in the capsular bag without complication. The remaining viscoelastic was removed from the eye with the irrigation-aspiration handpiece. The wounds were hydrated. The anterior chamber was flushed with Miostat and the eye was inflated to physiologic pressure. 0.26ml of Vigamox was placed in the anterior chamber. The wounds were found to be water tight. The eye was dressed with Vigamox. The patient was given protective glasses to wear throughout the day and a shield with which  to sleep tonight. The patient was also given drops with which to begin a drop regimen today and will follow-up with me in one day. Implant Name Type Inv. Item Serial No. Manufacturer Lot No. LRB No. Used  LENS IOL DIOP 15.0 - [id number] Intraocular Lens LENS IOL DIOP 15.0 [id number] AMO  Right 1   Procedure(s) with comments: CATARACT EXTRACTION PHACO AND INTRAOCULAR LENS PLACEMENT ([facility]) (Right) - [place] 00:40 AP% 13.5 CDE 5.50 Fluid pack lot $ [id number]  Electronically signed: Birder Robson 12/21/2017 9:07 AM

## 2017-12-21 NOTE — Anesthesia Post-op Follow-up Note (Signed)
Anesthesia QCDR form completed.        

## 2017-12-21 NOTE — H&P (Signed)
All labs reviewed. Abnormal studies sent to patients PCP when indicated.  Previous H&P reviewed, patient examined, there are NO CHANGES.  Cerys Winget Porfilio7/30/20198:39 AM

## 2017-12-21 NOTE — Anesthesia Preprocedure Evaluation (Signed)
Anesthesia Evaluation  Patient identified by MRN, date of birth, ID band Patient awake    Reviewed: Allergy & Precautions, NPO status , Patient's Chart, lab work & pertinent test results  History of Anesthesia Complications Negative for: history of anesthetic complications  Airway Mallampati: II       Dental   Pulmonary neg sleep apnea, neg COPD, former smoker,           Cardiovascular hypertension, Pt. on medications (-) Past MI and (-) CHF (-) dysrhythmias + Valvular Problems/Murmurs (murmur, no tx)      Neuro/Psych Anxiety    GI/Hepatic neg GERD  ,(+) Hepatitis -, B  Endo/Other  neg diabetes  Renal/GU negative Renal ROS     Musculoskeletal   Abdominal   Peds  Hematology   Anesthesia Other Findings   Reproductive/Obstetrics                             Anesthesia Physical Anesthesia Plan  ASA: III  Anesthesia Plan: MAC   Post-op Pain Management:    Induction:   PONV Risk Score and Plan:   Airway Management Planned:   Additional Equipment:   Intra-op Plan:   Post-operative Plan:   Informed Consent: I have reviewed the patients History and Physical, chart, labs and discussed the procedure including the risks, benefits and alternatives for the proposed anesthesia with the patient or authorized representative who has indicated his/her understanding and acceptance.     Plan Discussed with:   Anesthesia Plan Comments:         Anesthesia Quick Evaluation

## 2017-12-21 NOTE — Anesthesia Postprocedure Evaluation (Signed)
Anesthesia Post Note  Patient: Dale Mcdonald  Procedure(s) Performed: CATARACT EXTRACTION PHACO AND INTRAOCULAR LENS PLACEMENT ([place]) (Right Eye)  Patient location during evaluation: PACU Anesthesia Type: MAC Level of consciousness: awake and alert Pain management: pain level controlled Vital Signs Assessment: post-procedure vital signs reviewed and stable Anesthetic complications: no     Last Vitals:  Vitals:   12/21/17 0752 12/21/17 0909  BP: 111/68 113/66  Pulse: 85 (!) 57  Resp: 17 16  Temp: 36.6 C 36.6 C  SpO2: 98% 99%    Last Pain:  Vitals:   12/21/17 0909  TempSrc: Temporal  PainSc: 0-No pain                 Bernardo Heater

## 2017-12-21 NOTE — Transfer of Care (Signed)
Immediate Anesthesia Transfer of Care Note  Patient: Dale Mcdonald  Procedure(s) Performed: CATARACT EXTRACTION PHACO AND INTRAOCULAR LENS PLACEMENT ([facility]) (Right Eye)  Patient Location: PACU  Anesthesia Type:MAC  Level of Consciousness: awake, alert , oriented and patient cooperative  Airway & Oxygen Therapy: Patient Spontanous Breathing  Post-op Assessment: Report given to RN and Post -op Vital signs reviewed and stable  Post vital signs: Reviewed and stable  Last Vitals:  Vitals Value Taken Time  BP    Temp    Pulse    Resp    SpO2      Last Pain:  Vitals:   12/21/17 0752  TempSrc: Oral  PainSc: 0-No pain         Complications: No apparent anesthesia complications

## 2017-12-23 ENCOUNTER — Encounter: Payer: Self-pay | Admitting: Internal Medicine

## 2017-12-23 ENCOUNTER — Ambulatory Visit (INDEPENDENT_AMBULATORY_CARE_PROVIDER_SITE_OTHER): Payer: Medicare Other | Admitting: Internal Medicine

## 2017-12-23 VITALS — BP 100/60 | HR 84 | Temp 98.1°F | Ht 70.0 in | Wt 190.0 lb

## 2017-12-23 DIAGNOSIS — L739 Follicular disorder, unspecified: Secondary | ICD-10-CM | POA: Diagnosis not present

## 2017-12-23 MED ORDER — CEPHALEXIN 500 MG PO CAPS: 500.0000 mg | ORAL_CAPSULE | Freq: Three times a day (TID) | ORAL | 0 refills | Status: DC

## 2017-12-23 NOTE — Patient Instructions (Signed)
You can try warm compresses for now. If it gets much worse, start the antibiotic.

## 2017-12-23 NOTE — Assessment & Plan Note (Signed)
No adenopathy---reassured. Discussed warm compresses prn Fill the antibiotic only if worsens

## 2017-12-23 NOTE — Progress Notes (Signed)
Subjective:    Patient ID: Dale Mcdonald, male    DOB: Sep 04, 1950, 67 y.o.   MRN: [id number]  HPI Here due to masses under arms  Started under left arm around 10 days ago Felt sore then he felt lump A few days later--- started in left axillae Left has improved but still sore on right Using Right Guard gel--for many years--no change  Had been at [facility] when this started Then travelled to [place] to help friends (daughter died)  No fever No cat scratches No bites   Current Outpatient Medications on File Prior to Visit  Medication Sig Dispense Refill  . amLODipine (NORVASC) 5 MG tablet TAKE 1 TABLET(5 MG) BY MOUTH DAILY (Patient taking differently: Take 5 mg by mouth at bedtime. TAKE 1 TABLET(5 MG) BY MOUTH DAILY) 90 tablet 3  . aspirin EC 81 MG tablet Take 81 mg by mouth daily.    Marland Kitchen atorvastatin (LIPITOR) 20 MG tablet Take 1 tablet (20 mg total) by mouth daily. (Patient taking differently: Take 20 mg by mouth at bedtime. ) 90 tablet 2  . DESCOVY 200-25 MG tablet TAKE 1 TABLET BY MOUTH DAILY (Patient taking differently: TAKE 1 TABLET BY MOUTH DAILY IN THE EVENING.) 30 tablet 6  . ibuprofen (ADVIL,MOTRIN) 200 MG tablet Take 600 mg by mouth every 8 (eight) hours as needed (for pain.).    Marland Kitchen lisinopril-hydrochlorothiazide (PRINZIDE,ZESTORETIC) 20-25 MG tablet Take 1 tablet by mouth daily. 90 tablet 3  . Melatonin 5 MG TABS Take 5 mg by mouth at bedtime.     Marland Kitchen TIVICAY 50 MG tablet TAKE 1 TABLET(50 MG) BY MOUTH DAILY (Patient taking differently: TAKE 1 TABLET(50 MG) BY MOUTH DAILY IN THE EVENING.) 30 tablet 6  . valACYclovir (VALTREX) 500 MG tablet Take 1 tablet (500 mg total) by mouth daily. (Patient taking differently: Take 500 mg by mouth daily as needed (for fever blisters.). ) 30 tablet 11   No current facility-administered medications on file prior to visit.     No Known Allergies  Past Medical History:  Diagnosis Date  . Anxiety   . Anxiety 03/29/2017  . Heart murmur    . Hepatitis    B  . HIV (human immunodeficiency virus infection) ([place])   . Hyperlipidemia   . Hypertension   . Palpitations     Past Surgical History:  Procedure Laterality Date  . CATARACT EXTRACTION W/PHACO Left 11/11/2017   Procedure: CATARACT EXTRACTION PHACO AND INTRAOCULAR LENS PLACEMENT (IOC);  Surgeon: Birder Robson, MD;  Location: [facility] ORS;  Service: Ophthalmology;  Laterality: Left;  [place] 00:59.3 AP% 19.5 CDE 11.54 Fluid pack lot # [id number]  . CATARACT EXTRACTION W/PHACO Right 12/21/2017   Procedure: CATARACT EXTRACTION PHACO AND INTRAOCULAR LENS PLACEMENT (IOC);  Surgeon: Birder Robson, MD;  Location: [facility] ORS;  Service: Ophthalmology;  Laterality: Right;  [place] 00:40 AP% 13.5 CDE 5.50 Fluid pack lot $ [id number]  . COLONOSCOPY    . EYE SURGERY     RETINA  . HERNIA REPAIR      History reviewed. No pertinent family history.  Social History   Socioeconomic History  . Marital status: Widowed    Spouse name: Not on file  . Number of children: 1  . Years of education: Not on file  . Highest education level: Not on file  Occupational History  . Occupation: VICE PRESIDENT--Purchasing---retired    Comment: [employer]  . Financial resource strain: Not on file  . Food insecurity:  Worry: Not on file    Inability: Not on file  . Transportation needs:    Medical: Not on file    Non-medical: Not on file  Tobacco Use  . Smoking status: Former Smoker    Packs/day: 1.00    Types: Cigarettes  . Smokeless tobacco: Never Used  . Tobacco comment: gave 1-800-QUIT-NOW, currently on Chantix  Substance and Sexual Activity  . Alcohol use: No    Alcohol/week: 0.0 oz  . Drug use: No  . Sexual activity: Not on file  Lifestyle  . Physical activity:    Days per week: Not on file    Minutes per session: Not on file  . Stress: Not on file  Relationships  . Social connections:    Talks on phone: Not on file    Gets together: Not on file     Attends religious service: Not on file    Active member of club or organization: Not on file    Attends meetings of clubs or organizations: Not on file    Relationship status: Not on file  . Intimate partner violence:    Fear of current or ex partner: Not on file    Emotionally abused: Not on file    Physically abused: Not on file    Forced sexual activity: Not on file  Other Topics Concern  . Not on file  Social History Narrative   No living will   Would want son [name] to be decision maker   Would accept resuscitation   Not sure about tube feeds   Review of Systems  Has felt fine Appetite is okay Did have left groin pain briefly--no clear nodes. Gone after 1 day     Objective:   Physical Exam  Musculoskeletal:  Left axilla has not masses or irritation RIght axilla has mildly inflamed papule--but no adenopathy  Lymphadenopathy:       Head (right side): No submental, no submandibular, no tonsillar, no preauricular, no posterior auricular and no occipital adenopathy present.       Head (left side): No submental, no submandibular, no tonsillar, no preauricular, no posterior auricular and no occipital adenopathy present.    He has no cervical adenopathy.       Right: No inguinal adenopathy present.       Left: No inguinal adenopathy present.           Assessment & Plan:

## 2017-12-28 DIAGNOSIS — H3322 Serous retinal detachment, left eye: Secondary | ICD-10-CM | POA: Diagnosis not present

## 2018-01-03 ENCOUNTER — Encounter: Payer: Self-pay | Admitting: Internal Medicine

## 2018-01-03 ENCOUNTER — Ambulatory Visit (INDEPENDENT_AMBULATORY_CARE_PROVIDER_SITE_OTHER): Payer: Medicare Other | Admitting: Internal Medicine

## 2018-01-03 VITALS — BP 116/68 | HR 86 | Temp 98.2°F | Ht 70.5 in | Wt 187.0 lb

## 2018-01-03 DIAGNOSIS — I10 Essential (primary) hypertension: Secondary | ICD-10-CM

## 2018-01-03 DIAGNOSIS — F39 Unspecified mood [affective] disorder: Secondary | ICD-10-CM | POA: Diagnosis not present

## 2018-01-03 DIAGNOSIS — B181 Chronic viral hepatitis B without delta-agent: Secondary | ICD-10-CM | POA: Diagnosis not present

## 2018-01-03 DIAGNOSIS — Z0000 Encounter for general adult medical examination without abnormal findings: Secondary | ICD-10-CM | POA: Diagnosis not present

## 2018-01-03 DIAGNOSIS — B20 Human immunodeficiency virus [HIV] disease: Secondary | ICD-10-CM | POA: Diagnosis not present

## 2018-01-03 DIAGNOSIS — Z1211 Encounter for screening for malignant neoplasm of colon: Secondary | ICD-10-CM

## 2018-01-03 DIAGNOSIS — Z7189 Other specified counseling: Secondary | ICD-10-CM | POA: Insufficient documentation

## 2018-01-03 DIAGNOSIS — I340 Nonrheumatic mitral (valve) insufficiency: Secondary | ICD-10-CM | POA: Insufficient documentation

## 2018-01-03 NOTE — Assessment & Plan Note (Signed)
Mood has been better this year No Rx indicated

## 2018-01-03 NOTE — Progress Notes (Signed)
Subjective:    Patient ID: Dale Mcdonald, male    DOB: February 07, 1951, 67 y.o.   MRN: [id number]  HPI Here for Medicare wellness and follow up of chronic health conditions Reviewed form and advanced directives Reviewed other doctors Quit smoking but now vaping---weaning the nicotine Rare beer Has smoky film over the left eye--will need laser then (though the vision is okay) Can't drive at night Hearing is okay No falls No persistent depression. Not anhedonic Independent with instrumental ADLs  Notices his libido is down Stressful year--detached retina--then both cataracts Some energy issues---[facility] (sw[facility], etc) Now spending time with old high school girlfriend Performance an issue --trouble with maintaining erection even with viagra Overall mood is better this year  No chest pain No SOB No dizziness or syncope No edema No palpitations  Still on statin No myalgias or GI problems  Some heartburn--thinks it may be from the vaping tums helps---twice a week or so No dysphagia  Still on HIV regimen In study about cognition with Rx Still on med for the hep B as well  Current Outpatient Medications on File Prior to Visit  Medication Sig Dispense Refill  . amLODipine (NORVASC) 5 MG tablet TAKE 1 TABLET(5 MG) BY MOUTH DAILY (Patient taking differently: Take 5 mg by mouth at bedtime. TAKE 1 TABLET(5 MG) BY MOUTH DAILY) 90 tablet 3  . aspirin EC 81 MG tablet Take 81 mg by mouth daily.    Marland Kitchen atorvastatin (LIPITOR) 20 MG tablet Take 1 tablet (20 mg total) by mouth daily. (Patient taking differently: Take 20 mg by mouth at bedtime. ) 90 tablet 2  . DESCOVY 200-25 MG tablet TAKE 1 TABLET BY MOUTH DAILY (Patient taking differently: TAKE 1 TABLET BY MOUTH DAILY IN THE EVENING.) 30 tablet 6  . ibuprofen (ADVIL,MOTRIN) 200 MG tablet Take 600 mg by mouth every 8 (eight) hours as needed (for pain.).    Marland Kitchen lisinopril-hydrochlorothiazide (PRINZIDE,ZESTORETIC) 20-25 MG  tablet Take 1 tablet by mouth daily. 90 tablet 3  . Melatonin 5 MG TABS Take 5 mg by mouth at bedtime.     Marland Kitchen TIVICAY 50 MG tablet TAKE 1 TABLET(50 MG) BY MOUTH DAILY (Patient taking differently: TAKE 1 TABLET(50 MG) BY MOUTH DAILY IN THE EVENING.) 30 tablet 6  . valACYclovir (VALTREX) 500 MG tablet Take 1 tablet (500 mg total) by mouth daily. (Patient taking differently: Take 500 mg by mouth daily as needed (for fever blisters.). ) 30 tablet 11   No current facility-administered medications on file prior to visit.     No Known Allergies  Past Medical History:  Diagnosis Date  . Anxiety   . Anxiety 03/29/2017  . Heart murmur   . Hepatitis    B  . HIV (human immunodeficiency virus infection) ([place])   . Hyperlipidemia   . Hypertension   . Palpitations     Past Surgical History:  Procedure Laterality Date  . CATARACT EXTRACTION W/PHACO Left 11/11/2017   Procedure: CATARACT EXTRACTION PHACO AND INTRAOCULAR LENS PLACEMENT (I[facility]);  Surgeon: Birder Robson, MD;  Location: [facility] OR[facility];  Service: Ophthalmology;  Laterality: Left;  [place] 00:59.3 AP% 19.5 CDE 11.54 Fluid pack lot # [id number]  . CATARACT EXTRACTION W/PHACO Right 12/21/2017   Procedure: CATARACT EXTRACTION PHACO AND INTRAOCULAR LENS PLACEMENT (IOC);  Surgeon: Birder Robson, MD;  Location: [facility] OR[facility];  Service: Ophthalmology;  Laterality: Right;  [place] 00:40 AP% 13.5 CDE 5.50 Fluid pack lot $ [id number]  . COLONOSCOPY    .  EYE SURGERY     RETINA  . HERNIA REPAIR      History reviewed. No pertinent family history.  Social History   Socioeconomic History  . Marital status: Widowed    Spouse name: Not on file  . Number of children: 1  . Years of education: Not on file  . Highest education level: Not on file  Occupational History  . Occupation: VICE PRESIDENT--Purchasing---retired    Comment: [employer]  . Financial resource strain: Not on file  . Food insecurity:    Worry: Not on file     Inability: Not on file  . Transportation needs:    Medical: Not on file    Non-medical: Not on file  Tobacco Use  . Smoking status: Former Smoker    Packs/day: 1.00    Types: Cigarettes  . Smokeless tobacco: Never Used  . Tobacco comment: gave 1-800-QUIT-NOW, currently on Chantix  Substance and Sexual Activity  . Alcohol use: No    Alcohol/week: 0.0 standard drinks  . Drug use: No  . Sexual activity: Not on file  Lifestyle  . Physical activity:    Days per week: Not on file    Minutes per session: Not on file  . Stress: Not on file  Relationships  . Social connections:    Talks on phone: Not on file    Gets together: Not on file    Attends religious service: Not on file    Active member of club or organization: Not on file    Attends meetings of clubs or organizations: Not on file    Relationship status: Not on file  . Intimate partner violence:    Fear of current or ex partner: Not on file    Emotionally abused: Not on file    Physically abused: Not on file    Forced sexual activity: Not on file  Other Topics Concern  . Not on file  Social History Narrative   No living will   Would want son Dale Mcdonald to be decision maker   Would accept resuscitation   Not sure about tube feeds   Review of Systems Swelling in axillae cleared fine Sleeps well Appetite is good Weight stable Wars seat belt Still working on dental implants Plans to see dermatologist--- some dryness on ears and behind  bowels are fine. No blood No urinary problems    Objective:   Physical Exam  Constitutional: He is oriented to person, place, and time. He appears well-developed. No distress.  HENT:  Mouth/Throat: Oropharynx is clear and moist. No oropharyngeal exudate.  Neck: No thyromegaly present.  Cardiovascular: Normal rate, regular rhythm and intact distal pulses. Exam reveals no gallop.  Louder MR murmur  Respiratory: Effort normal and breath sounds normal. No respiratory distress. He has no  wheezes. He has no rales.  GI: Soft. There is no tenderness.  Musculoskeletal: He exhibits no edema or tenderness.  Lymphadenopathy:    He has no cervical adenopathy.  Neurological: He is alert and oriented to person, place, and time.  President--- "Daisy Floro, Barack Obama, Bush" [phone] D-[id number] Recall 2/3  Skin: No rash noted. No erythema.  Psychiatric: He has a normal mood and affect. His behavior is normal.           Assessment & Plan:

## 2018-01-03 NOTE — Assessment & Plan Note (Signed)
BP Readings from Last 3 Encounters:  01/03/18 116/68  12/23/17 100/60  12/21/17 113/66   Good control Labs in December all fine

## 2018-01-03 NOTE — Assessment & Plan Note (Signed)
Doing well on his regimen Follows with ID

## 2018-01-03 NOTE — Assessment & Plan Note (Signed)
Murmur more noticeable Will check echo

## 2018-01-03 NOTE — Assessment & Plan Note (Signed)
See social history 

## 2018-01-03 NOTE — Assessment & Plan Note (Signed)
Controlled with antivirals

## 2018-01-03 NOTE — Assessment & Plan Note (Signed)
I have personally reviewed the Medicare Annual Wellness questionnaire and have noted 1. The patient's medical and social history 2. Their use of alcohol, tobacco or illicit drugs 3. Their current medications and supplements 4. The patient's functional ability including ADL's, fall risks, home safety risks and hearing or visual             impairment. 5. Diet and physical activities 6. Evidence for depression or mood disorders  The patients weight, height, BMI and visual acuity have been recorded in the chart I have made referrals, counseling and provided education to the patient based review of the above and I have provided the pt with a written personalized care plan for preventive services.  I have provided you with a copy of your personalized plan for preventive services. Please take the time to review along with your updated medication list.  UTD on imms--yearly flu vaccine FIT  Discussed PSA--defer and discuss next year Discussed fitness

## 2018-01-03 NOTE — Progress Notes (Signed)
Hearing Screening   Method: Audiometry   125Hz  250Hz  500Hz  1000Hz  2000Hz  3000Hz  4000Hz  6000Hz  8000Hz   Right ear:   20 20 20  20     Left ear:   20 40 20  40    Vision Screening Comments: August 2019

## 2018-01-04 ENCOUNTER — Other Ambulatory Visit: Payer: Self-pay

## 2018-01-04 ENCOUNTER — Ambulatory Visit (INDEPENDENT_AMBULATORY_CARE_PROVIDER_SITE_OTHER): Payer: Medicare Other

## 2018-01-04 DIAGNOSIS — I340 Nonrheumatic mitral (valve) insufficiency: Secondary | ICD-10-CM | POA: Diagnosis not present

## 2018-01-05 ENCOUNTER — Other Ambulatory Visit: Payer: Self-pay | Admitting: Internal Medicine

## 2018-01-05 DIAGNOSIS — I340 Nonrheumatic mitral (valve) insufficiency: Secondary | ICD-10-CM

## 2018-01-05 NOTE — Progress Notes (Signed)
cardio

## 2018-01-06 NOTE — Telephone Encounter (Signed)
Dale Mcdonald,  We need to cancel the cardiology referral in [place]. He wishes to be seen at [facility] (not even [place] apparently) Please let me know if I have to put in another referral.

## 2018-01-10 ENCOUNTER — Other Ambulatory Visit (INDEPENDENT_AMBULATORY_CARE_PROVIDER_SITE_OTHER): Payer: Medicare Other

## 2018-01-10 DIAGNOSIS — Z1211 Encounter for screening for malignant neoplasm of colon: Secondary | ICD-10-CM | POA: Diagnosis not present

## 2018-01-10 DIAGNOSIS — H3321 Serous retinal detachment, right eye: Secondary | ICD-10-CM | POA: Diagnosis not present

## 2018-01-10 LAB — FECAL OCCULT BLOOD, IMMUNOCHEMICAL: Fecal Occult Bld: POSITIVE — AB

## 2018-01-12 ENCOUNTER — Other Ambulatory Visit: Payer: Self-pay | Admitting: Internal Medicine

## 2018-01-12 ENCOUNTER — Other Ambulatory Visit: Payer: Self-pay

## 2018-01-12 DIAGNOSIS — R195 Other fecal abnormalities: Secondary | ICD-10-CM

## 2018-01-18 DIAGNOSIS — H3321 Serous retinal detachment, right eye: Secondary | ICD-10-CM | POA: Diagnosis not present

## 2018-01-27 DIAGNOSIS — I517 Cardiomegaly: Secondary | ICD-10-CM | POA: Insufficient documentation

## 2018-02-03 ENCOUNTER — Telehealth: Payer: Self-pay | Admitting: Internal Medicine

## 2018-02-03 NOTE — Telephone Encounter (Signed)
Lm on Dale Mcdonald's vm and advised she will have to contact imaging location where it was completed to have echo placed on disc

## 2018-02-03 NOTE — Telephone Encounter (Signed)
The patient needs to go to the facility that the test was performed

## 2018-02-03 NOTE — Telephone Encounter (Signed)
Copied from [place] [phone]. Topic: General - Other >> Feb 03, 2018  1:56 PM Keene Breath wrote: Reason for CRM: Altha Harm with [facility] called to ask if she could get a copy of the patient's CD Disk of Echo done on 01/04/18.  Please advise and call back at [phone].

## 2018-02-08 ENCOUNTER — Ambulatory Visit: Payer: Medicare Other | Admitting: Anesthesiology

## 2018-02-08 ENCOUNTER — Encounter
Admission: RE | Disposition: A | Discharge status: Home / Self Care | Payer: Self-pay | Source: Ambulatory Visit | Attending: Gastroenterology

## 2018-02-08 ENCOUNTER — Ambulatory Visit
Admission: RE | Admit: 2018-02-08 | Discharge: 2018-02-08 | Disposition: A | Discharge status: Home / Self Care | Payer: Medicare Other | Source: Ambulatory Visit | Attending: Gastroenterology | Admitting: Gastroenterology

## 2018-02-08 DIAGNOSIS — K640 First degree hemorrhoids: Secondary | ICD-10-CM | POA: Insufficient documentation

## 2018-02-08 DIAGNOSIS — Z7982 Long term (current) use of aspirin: Secondary | ICD-10-CM | POA: Insufficient documentation

## 2018-02-08 DIAGNOSIS — Z79899 Other long term (current) drug therapy: Secondary | ICD-10-CM | POA: Insufficient documentation

## 2018-02-08 DIAGNOSIS — R195 Other fecal abnormalities: Secondary | ICD-10-CM

## 2018-02-08 DIAGNOSIS — Z87891 Personal history of nicotine dependence: Secondary | ICD-10-CM | POA: Diagnosis not present

## 2018-02-08 DIAGNOSIS — K5790 Diverticulosis of intestine, part unspecified, without perforation or abscess without bleeding: Secondary | ICD-10-CM | POA: Diagnosis not present

## 2018-02-08 DIAGNOSIS — A630 Anogenital (venereal) warts: Secondary | ICD-10-CM | POA: Diagnosis not present

## 2018-02-08 DIAGNOSIS — E785 Hyperlipidemia, unspecified: Secondary | ICD-10-CM | POA: Insufficient documentation

## 2018-02-08 DIAGNOSIS — D123 Benign neoplasm of transverse colon: Secondary | ICD-10-CM

## 2018-02-08 DIAGNOSIS — Z21 Asymptomatic human immunodeficiency virus [HIV] infection status: Secondary | ICD-10-CM | POA: Insufficient documentation

## 2018-02-08 DIAGNOSIS — K5730 Diverticulosis of large intestine without perforation or abscess without bleeding: Secondary | ICD-10-CM | POA: Insufficient documentation

## 2018-02-08 DIAGNOSIS — I10 Essential (primary) hypertension: Secondary | ICD-10-CM | POA: Diagnosis not present

## 2018-02-08 DIAGNOSIS — Z86010 Personal history of colonic polyps: Secondary | ICD-10-CM | POA: Insufficient documentation

## 2018-02-08 DIAGNOSIS — D126 Benign neoplasm of colon, unspecified: Secondary | ICD-10-CM | POA: Diagnosis not present

## 2018-02-08 DIAGNOSIS — K635 Polyp of colon: Secondary | ICD-10-CM | POA: Insufficient documentation

## 2018-02-08 DIAGNOSIS — D124 Benign neoplasm of descending colon: Secondary | ICD-10-CM | POA: Diagnosis not present

## 2018-02-08 DIAGNOSIS — Z860101 Personal history of adenomatous and serrated colon polyps: Secondary | ICD-10-CM

## 2018-02-08 HISTORY — DX: Personal history of adenomatous and serrated colon polyps: Z86.0101

## 2018-02-08 HISTORY — DX: Personal history of colonic polyps: Z86.010

## 2018-02-08 HISTORY — PX: COLONOSCOPY WITH PROPOFOL: SHX5780

## 2018-02-08 SURGERY — COLONOSCOPY WITH PROPOFOL
Anesthesia: General

## 2018-02-08 MED ORDER — PROPOFOL 10 MG/ML IV BOLUS
INTRAVENOUS | Status: AC
  Filled 2018-02-08: qty 20

## 2018-02-08 MED ORDER — LIDOCAINE HCL (PF) 1 % IJ SOLN
2.0000 mL | Freq: Once | INTRAMUSCULAR | Status: AC
  Administered 2018-02-08: 0.3 mL via INTRADERMAL

## 2018-02-08 MED ORDER — PROPOFOL 10 MG/ML IV BOLUS
INTRAVENOUS | Status: DC | PRN
  Administered 2018-02-08: 80 mg via INTRAVENOUS

## 2018-02-08 MED ORDER — LIDOCAINE HCL (PF) 1 % IJ SOLN
INTRAMUSCULAR | Status: AC
  Administered 2018-02-08: 0.3 mL via INTRADERMAL
  Filled 2018-02-08: qty 2

## 2018-02-08 MED ORDER — LIDOCAINE HCL (CARDIAC) PF 100 MG/5ML IV SOSY
PREFILLED_SYRINGE | INTRAVENOUS | Status: DC | PRN
  Administered 2018-02-08: 100 mg via INTRAVENOUS

## 2018-02-08 MED ORDER — SODIUM CHLORIDE 0.9 % IV SOLN
INTRAVENOUS | Status: DC
  Administered 2018-02-08: 1000 mL via INTRAVENOUS

## 2018-02-08 MED ORDER — PROPOFOL 500 MG/50ML IV EMUL
INTRAVENOUS | Status: DC | PRN
  Administered 2018-02-08: 150 ug/kg/min via INTRAVENOUS

## 2018-02-08 MED ORDER — LIDOCAINE HCL (PF) 2 % IJ SOLN
INTRAMUSCULAR | Status: AC
  Filled 2018-02-08: qty 10

## 2018-02-08 NOTE — H&P (Addendum)
Jonathon Bellows, MD [address] [place], [place], [place], [address] [address], [place], [place], [place], [address] Phone: [phone]  Fax: [phone]  Primary Care Physician:  [name], MD   Pre-Procedure History & Physical: HPI:  Dale Mcdonald is a 67 y.o. male is here for an colonoscopy.   Past Medical History:  Diagnosis Date  . Anxiety   . Anxiety 03/29/2017  . Heart murmur   . Hepatitis    B  . HIV (human immunodeficiency virus infection) ([place])   . Hyperlipidemia   . Hypertension   . Palpitations     Past Surgical History:  Procedure Laterality Date  . CATARACT EXTRACTION W/PHACO Left 11/11/2017   Procedure: CATARACT EXTRACTION PHACO AND INTRAOCULAR LENS PLACEMENT (IOC);  Surgeon: Birder Robson, MD;  Location: [facility] ORS;  Service: Ophthalmology;  Laterality: Left;  [place] 00:59.3 AP% 19.5 CDE 11.54 Fluid pack lot # [id number]  . CATARACT EXTRACTION W/PHACO Right 12/21/2017   Procedure: CATARACT EXTRACTION PHACO AND INTRAOCULAR LENS PLACEMENT (IOC);  Surgeon: Birder Robson, MD;  Location: [facility] ORS;  Service: Ophthalmology;  Laterality: Right;  [place] 00:40 AP% 13.5 CDE 5.50 Fluid pack lot $ [id number]  . COLONOSCOPY    . EYE SURGERY     RETINA  . HERNIA REPAIR      Prior to Admission medications   Medication Sig Start Date End Date Taking? Authorizing Provider  amLODipine (NORVASC) 5 MG tablet TAKE 1 TABLET(5 MG) BY MOUTH DAILY Patient taking differently: Take 5 mg by mouth at bedtime. TAKE 1 TABLET(5 MG) BY MOUTH DAILY 06/01/17   [name] I, MD  aspirin EC 81 MG tablet Take 81 mg by mouth daily.    [provider]  atorvastatin (LIPITOR) 20 MG tablet Take 1 tablet (20 mg total) by mouth daily. Patient taking differently: Take 20 mg by mouth at bedtime.  09/27/17   [name], MD  DESCOVY 200-25 MG tablet TAKE 1 TABLET BY MOUTH DAILY Patient taking differently: TAKE 1 TABLET BY MOUTH DAILY IN THE EVENING. 07/30/17    Tommy Medal, Lavell Islam, MD  ibuprofen (ADVIL,MOTRIN) 200 MG tablet Take 600 mg by mouth every 8 (eight) hours as needed (for pain.).    [provider]  lisinopril-hydrochlorothiazide (PRINZIDE,ZESTORETIC) 20-25 MG tablet Take 1 tablet by mouth daily. 06/01/17   [name], MD  Melatonin 5 MG TABS Take 5 mg by mouth at bedtime.     [provider]  TIVICAY 50 MG tablet TAKE 1 TABLET(50 MG) BY MOUTH DAILY Patient taking differently: TAKE 1 TABLET(50 MG) BY MOUTH DAILY IN THE EVENING. 07/30/17   Tommy Medal, Lavell Islam, MD  valACYclovir (VALTREX) 500 MG tablet Take 1 tablet (500 mg total) by mouth daily. Patient taking differently: Take 500 mg by mouth daily as needed (for fever blisters.).  10/29/17   Truman Hayward, MD    Allergies as of 01/13/2018  . (No Known Allergies)    History reviewed. No pertinent family history.  Social History   Socioeconomic History  . Marital status: Widowed    Spouse name: Not on file  . Number of children: 1  . Years of education: Not on file  . Highest education level: Not on file  Occupational History  . Occupation: VICE PRESIDENT--Purchasing---retired    Comment: [employer]  . Financial resource strain: Not on file  . Food insecurity:  Worry: Not on file    Inability: Not on file  . Transportation needs:    Medical: Not on file    Non-medical: Not on file  Tobacco Use  . Smoking status: Former Smoker    Packs/day: 1.00    Types: Cigarettes  . Smokeless tobacco: Never Used  . Tobacco comment: gave 1-800-QUIT-NOW, currently on Chantix  Substance and Sexual Activity  . Alcohol use: No    Alcohol/week: 0.0 standard drinks  . Drug use: No  . Sexual activity: Not on file  Lifestyle  . Physical activity:    Days per week: Not on file    Minutes per session: Not on file  . Stress: Not on file  Relationships  . Social connections:    Talks on phone: Not on file    Gets together: Not on  file    Attends religious service: Not on file    Active member of club or organization: Not on file    Attends meetings of clubs or organizations: Not on file    Relationship status: Not on file  . Intimate partner violence:    Fear of current or ex partner: Not on file    Emotionally abused: Not on file    Physically abused: Not on file    Forced sexual activity: Not on file  Other Topics Concern  . Not on file  Social History Narrative   No living will   Would want son [name] to be decision maker   Would accept resuscitation   Not sure about tube feeds    Review of Systems: See HPI, otherwise negative ROS  Physical Exam: BP 112/76   Pulse 66   Temp (!) 96.7 F (35.9 C) (Tympanic)   Resp 17   Ht 5\' 10"  (1.778 m)   Wt 81.6 kg   SpO2 99%   BMI 25.83 kg/m  General:   Alert,  pleasant and cooperative in NAD Head:  Normocephalic and atraumatic. Neck:  Supple; no masses or thyromegaly. Lungs:  Clear throughout to auscultation, normal respiratory effort.    Heart:  +S1, +S2, Regular rate and rhythm, No edema. Abdomen:  Soft, nontender and nondistended. Normal bowel sounds, without guarding, and without rebound.   Neurologic:  Alert and  oriented x4;  grossly normal neurologically.  Impression/Plan: Dale Mcdonald is here for an colonoscopy to be performed for positive FIT test.   Risks, benefits, limitations, and alternatives regarding  colonoscopy have been reviewed with the patient.  Questions have been answered.  All parties agreeable.   Jonathon Bellows, MD  02/08/2018, 8:55 AM

## 2018-02-08 NOTE — Transfer of Care (Signed)
Immediate Anesthesia Transfer of Care Note  Patient: Dale Mcdonald  Procedure(s) Performed: COLONOSCOPY WITH PROPOFOL (N/A )  Patient Location: Endoscopy Unit  Anesthesia Type:General  Level of Consciousness: awake and alert   Airway & Oxygen Therapy: Patient Spontanous Breathing and Patient connected to nasal cannula oxygen  Post-op Assessment: Report given to RN and Post -op Vital signs reviewed and stable  Post vital signs: Reviewed and stable  Last Vitals:  Vitals Value Taken Time  BP    Temp 36.1 C 02/08/2018  9:38 AM  Pulse    Resp    SpO2      Last Pain:  Vitals:   02/08/18 0938  TempSrc: Tympanic  PainSc:          Complications: No apparent anesthesia complications

## 2018-02-08 NOTE — Op Note (Signed)
[facility] Gastroenterology Patient Name: Dale Mcdonald Procedure Date: 02/08/2018 8:55 AM MRN: [id number] Account #: [id number] Date of Birth: [birth year] Admit Type: Outpatient Age: 67 Room: [facility] ENDO ROOM 1 Gender: Male Note Status: Finalized Procedure:            Colonoscopy Indications:          Positive fecal immunochemical test Providers:            Jonathon Bellows MD, MD Referring MD:         [name] (Referring MD) Medicines:            Monitored Anesthesia Care Complications:        No immediate complications. Procedure:            Pre-Anesthesia Assessment:                       - Prior to the procedure, a History and Physical was                        performed, and patient medications, allergies and                        sensitivities were reviewed. The patient's tolerance of                        previous anesthesia was reviewed.                       - The risks and benefits of the procedure and the                        sedation options and risks were discussed with the                        patient. All questions were answered and informed                        consent was obtained.                       - ASA Grade Assessment: II - A patient with mild                        systemic disease.                       After obtaining informed consent, the colonoscope was                        passed under direct vision. Throughout the procedure,                        the patient's blood pressure, pulse, and oxygen                        saturations were monitored continuously. The                        Colonoscope was introduced through the anus and  advanced to the the cecum, identified by the                        appendiceal orifice, IC valve and transillumination.                        The colonoscopy was performed with ease. The patient                        tolerated the procedure well. The quality of the  bowel                        preparation was good. Findings:      The perianal and digital rectal examinations were normal.      Non-bleeding internal hemorrhoids were found during retroflexion. The       hemorrhoids were medium-sized and Grade I (internal hemorrhoids that do       not prolapse).      Two sessile polyps were found in the descending colon and transverse       colon. The polyps were 4 to 6 mm in size. These polyps were removed with       a cold snare. Resection and retrieval were complete.      A 3 mm polyp was found in the descending colon. The polyp was sessile.       The polyp was removed with a cold biopsy forceps. Resection and       retrieval were complete.      Multiple small-mouthed diverticula were found in the sigmoid colon.      No additional abnormalities were found on retroflexion.      Few condylomata were found. seen over hemorroids- unclear if they are       warts Impression:           - Non-bleeding internal hemorrhoids.                       - Two 4 to 6 mm polyps in the descending colon and in                        the transverse colon, removed with a cold snare.                        Resected and retrieved.                       - One 3 mm polyp in the descending colon, removed with                        a cold biopsy forceps. Resected and retrieved.                       - Diverticulosis in the sigmoid colon. Recommendation:       - Discharge patient to home (with escort).                       - Resume previous diet.                       - Continue present medications.                       -  Await pathology results.                       - Repeat colonoscopy in 3 - 5 years for surveillance                        based on pathology results.                       - Refer to colorectal surgery to evaluatre wart like                        lesions over anus on retroflexion                       - Return to my office in 8 weeks. Procedure Code(s):     --- Professional ---                       [phone], Colonoscopy, flexible; with removal of tumor(s),                        polyp(s), or other lesion(s) by snare technique                       45380, 76, Colonoscopy, flexible; with biopsy, single                        or multiple Diagnosis Code(s):    --- Professional ---                       D12.4, Benign neoplasm of descending colon                       D12.3, Benign neoplasm of transverse colon (hepatic                        flexure or splenic flexure)                       K64.0, First degree hemorrhoids                       R19.5, Other fecal abnormalities                       K57.30, Diverticulosis of large intestine without                        perforation or abscess without bleeding CPT copyright 2017 American Medical Association. All rights reserved. The codes documented in this report are preliminary and upon coder review may  be revised to meet current compliance requirements. Jonathon Bellows, MD Jonathon Bellows MD, MD 02/08/2018 9:34:00 AM This report has been signed electronically. Number of Addenda: 0 Note Initiated On: 02/08/2018 8:55 AM Scope Withdrawal Time: 0 hours 11 minutes 42 seconds  Total Procedure Duration: 0 hours 17 minutes 4 seconds       [facility]

## 2018-02-08 NOTE — Anesthesia Post-op Follow-up Note (Signed)
Anesthesia QCDR form completed.        

## 2018-02-08 NOTE — Anesthesia Preprocedure Evaluation (Signed)
Anesthesia Evaluation  Patient identified by MRN, date of birth, ID band Patient awake    Reviewed: Allergy & Precautions, NPO status , Patient's Chart, lab work & pertinent test results  History of Anesthesia Complications Negative for: history of anesthetic complications  Airway Mallampati: II       Dental  (+) Dental Advidsory Given, Implants, Missing   Pulmonary neg sleep apnea, neg COPD, former smoker,           Cardiovascular hypertension, Pt. on medications (-) Past MI and (-) CHF (-) dysrhythmias + Valvular Problems/Murmurs (murmur, no tx)      Neuro/Psych PSYCHIATRIC DISORDERS Anxiety    GI/Hepatic neg GERD  ,(+) Hepatitis -, B  Endo/Other  neg diabetes  Renal/GU negative Renal ROS     Musculoskeletal   Abdominal   Peds  Hematology   Anesthesia Other Findings Past Medical History: No date: Anxiety 03/29/2017: Anxiety No date: Heart murmur No date: Hepatitis     Comment:  B No date: HIV (human immunodeficiency virus infection) ([place]) No date: Hyperlipidemia No date: Hypertension No date: Palpitations   Reproductive/Obstetrics                             Anesthesia Physical  Anesthesia Plan  ASA: III  Anesthesia Plan: General   Post-op Pain Management:    Induction: Intravenous  PONV Risk Score and Plan:   Airway Management Planned: Nasal Cannula and Natural Airway  Additional Equipment:   Intra-op Plan:   Post-operative Plan:   Informed Consent: I have reviewed the patients History and Physical, chart, labs and discussed the procedure including the risks, benefits and alternatives for the proposed anesthesia with the patient or authorized representative who has indicated his/her understanding and acceptance.     Plan Discussed with:   Anesthesia Plan Comments:         Anesthesia Quick Evaluation

## 2018-02-08 NOTE — Anesthesia Postprocedure Evaluation (Signed)
Anesthesia Post Note  Patient: Dale Mcdonald  Procedure(s) Performed: COLONOSCOPY WITH PROPOFOL (N/A )  Patient location during evaluation: Endoscopy Anesthesia Type: General Level of consciousness: awake and alert Pain management: pain level controlled Vital Signs Assessment: post-procedure vital signs reviewed and stable Respiratory status: spontaneous breathing, nonlabored ventilation, respiratory function stable and patient connected to nasal cannula oxygen Cardiovascular status: blood pressure returned to baseline and stable Postop Assessment: no apparent nausea or vomiting Anesthetic complications: no     Last Vitals:  Vitals:   02/08/18 0942 02/08/18 1008  BP: (!) 94/52 132/73  Pulse:    Resp:    Temp:    SpO2:      Last Pain:  Vitals:   02/08/18 1020  TempSrc:   PainSc: 0-No pain                 Martha Clan

## 2018-02-10 LAB — SURGICAL PATHOLOGY

## 2018-02-11 ENCOUNTER — Telehealth: Payer: Self-pay

## 2018-02-11 ENCOUNTER — Other Ambulatory Visit: Payer: Self-pay

## 2018-02-11 DIAGNOSIS — K640 First degree hemorrhoids: Secondary | ICD-10-CM

## 2018-02-11 DIAGNOSIS — R195 Other fecal abnormalities: Secondary | ICD-10-CM

## 2018-02-11 DIAGNOSIS — I517 Cardiomegaly: Secondary | ICD-10-CM | POA: Diagnosis not present

## 2018-02-11 DIAGNOSIS — I10 Essential (primary) hypertension: Secondary | ICD-10-CM | POA: Diagnosis not present

## 2018-02-11 DIAGNOSIS — I420 Dilated cardiomyopathy: Secondary | ICD-10-CM | POA: Diagnosis not present

## 2018-02-11 DIAGNOSIS — E782 Mixed hyperlipidemia: Secondary | ICD-10-CM | POA: Diagnosis not present

## 2018-02-11 DIAGNOSIS — I340 Nonrheumatic mitral (valve) insufficiency: Secondary | ICD-10-CM | POA: Diagnosis not present

## 2018-02-11 NOTE — Telephone Encounter (Signed)
Spoke with pt in regards to biopsy results and Dr. Georgeann Oppenheim instructions to refer pt to a colorectal surgeon for evaluation. Pt is aware that we have referred him to [facility].

## 2018-02-11 NOTE — Telephone Encounter (Signed)
-----   Message from Jonathon Bellows, MD sent at 02/08/2018  9:37 AM EDT ----- Regarding: please arrange appointment    Please arrange referral to colorectal surgeon at [facility] to evaluate ?condyloma or warts on anus/hemorroids.    Regards    Dr Jonathon Bellows  Gastroenterology/Hepatology Pager: [phone]

## 2018-02-13 ENCOUNTER — Encounter: Payer: Self-pay | Admitting: Gastroenterology

## 2018-02-14 ENCOUNTER — Other Ambulatory Visit: Payer: Self-pay | Admitting: Infectious Disease

## 2018-02-14 DIAGNOSIS — B20 Human immunodeficiency virus [HIV] disease: Secondary | ICD-10-CM

## 2018-02-17 ENCOUNTER — Telehealth: Payer: Self-pay | Admitting: Gastroenterology

## 2018-02-17 NOTE — Telephone Encounter (Signed)
Pt is concerned that he hasn't heard anything else abt the surgeon or referral. Would like the nurse or doctor to call asap

## 2018-03-01 DIAGNOSIS — H3321 Serous retinal detachment, right eye: Secondary | ICD-10-CM | POA: Diagnosis not present

## 2018-03-02 DIAGNOSIS — I420 Dilated cardiomyopathy: Secondary | ICD-10-CM | POA: Diagnosis not present

## 2018-03-02 DIAGNOSIS — I340 Nonrheumatic mitral (valve) insufficiency: Secondary | ICD-10-CM | POA: Diagnosis not present

## 2018-03-02 DIAGNOSIS — I517 Cardiomegaly: Secondary | ICD-10-CM | POA: Diagnosis not present

## 2018-03-07 DIAGNOSIS — Z961 Presence of intraocular lens: Secondary | ICD-10-CM | POA: Diagnosis not present

## 2018-03-08 ENCOUNTER — Encounter (INDEPENDENT_AMBULATORY_CARE_PROVIDER_SITE_OTHER): Payer: Medicare Other | Admitting: *Deleted

## 2018-03-08 VITALS — BP 143/67 | HR 82 | Temp 97.7°F | Wt 191.8 lb

## 2018-03-08 DIAGNOSIS — Z006 Encounter for examination for normal comparison and control in clinical research program: Secondary | ICD-10-CM

## 2018-03-08 NOTE — Research (Signed)
Dale Mcdonald is here for his week 288 visit for A5322. Leaving after he visit to drive to [place] to visit with his son/family. States that he has been feeling stressed recently related to some health issues. Recently referred to a cardiologist due to his heart murmur sounding louder. Had a stress MRI last week and is scheduled for cardiac cath on 10/24. Currently having no symptoms. Denies any shortness of breath, dyspnea on exertion or chest pain. States he quit smoking in January but has started vaping. He will return in November to see Dr. Tommy Medal and then in February for his next study visit.

## 2018-03-09 LAB — PROTEIN / CREATININE RATIO, URINE
Creatinine, Urine: 63 mg/dL (ref 20–320)
PROTEIN/CREATININE RATIO: 0.095 mg/mg{creat} (ref 0.022–0.12)
Protein/Creat Ratio: 95 mg/g creat (ref 22–128)
TOTAL PROTEIN, URINE: 6 mg/dL (ref 5–25)

## 2018-03-11 LAB — COMPREHENSIVE METABOLIC PANEL
AG RATIO: 1.3 (calc) (ref 1.0–2.5)
ALKALINE PHOSPHATASE (APISO): 96 U/L (ref 40–115)
ALT: 35 U/L (ref 9–46)
AST: 31 U/L (ref 10–35)
Albumin: 4.4 g/dL (ref 3.6–5.1)
BUN/Creatinine Ratio: 10 (calc) (ref 6–22)
BUN: 14 mg/dL (ref 7–25)
CHLORIDE: 96 mmol/L — AB (ref 98–110)
CO2: 29 mmol/L (ref 20–32)
CREATININE: 1.34 mg/dL — AB (ref 0.70–1.25)
Calcium: 10.1 mg/dL (ref 8.6–10.3)
GLOBULIN: 3.5 g/dL (ref 1.9–3.7)
Glucose, Bld: 115 mg/dL — ABNORMAL HIGH (ref 65–99)
Potassium: 4.3 mmol/L (ref 3.5–5.3)
Sodium: 135 mmol/L (ref 135–146)
Total Bilirubin: 0.7 mg/dL (ref 0.2–1.2)
Total Protein: 7.9 g/dL (ref 6.1–8.1)

## 2018-03-11 LAB — HEMOGLOBIN A1C
HEMOGLOBIN A1C: 6.3 %{Hb} — AB (ref <5.7)
MEAN PLASMA GLUCOSE: 134 (calc)
eAG (mmol/L): 7.4 (calc)

## 2018-03-11 LAB — HEPATITIS C ANTIBODY
Hepatitis C Ab: NONREACTIVE
SIGNAL TO CUT-OFF: 0.05 (ref <1.00)

## 2018-03-11 LAB — LIPID PANEL
Cholesterol: 133 mg/dL (ref <200)
HDL: 46 mg/dL (ref >40)
LDL Cholesterol (Calc): 73 mg/dL (calc)
NON-HDL CHOLESTEROL (CALC): 87 mg/dL (ref <130)
Total CHOL/HDL Ratio: 2.9 (calc) (ref <5.0)
Triglycerides: 62 mg/dL (ref <150)

## 2018-03-11 LAB — HEPATITIS B SURFACE ANTIGEN: HEP B S AG: REACTIVE — AB

## 2018-03-11 LAB — HEPATITIS B SURFACE ANTIBODY,QUALITATIVE: HEP B S AB: NONREACTIVE

## 2018-03-16 ENCOUNTER — Other Ambulatory Visit: Payer: Medicare Other

## 2018-03-17 DIAGNOSIS — I110 Hypertensive heart disease with heart failure: Secondary | ICD-10-CM | POA: Diagnosis not present

## 2018-03-17 DIAGNOSIS — I348 Other nonrheumatic mitral valve disorders: Secondary | ICD-10-CM | POA: Diagnosis not present

## 2018-03-17 DIAGNOSIS — I420 Dilated cardiomyopathy: Secondary | ICD-10-CM | POA: Diagnosis not present

## 2018-03-17 DIAGNOSIS — R9439 Abnormal result of other cardiovascular function study: Secondary | ICD-10-CM | POA: Diagnosis not present

## 2018-03-17 DIAGNOSIS — I340 Nonrheumatic mitral (valve) insufficiency: Secondary | ICD-10-CM | POA: Diagnosis not present

## 2018-03-17 DIAGNOSIS — E785 Hyperlipidemia, unspecified: Secondary | ICD-10-CM | POA: Diagnosis not present

## 2018-03-17 DIAGNOSIS — I5021 Acute systolic (congestive) heart failure: Secondary | ICD-10-CM | POA: Diagnosis not present

## 2018-03-17 DIAGNOSIS — I2510 Atherosclerotic heart disease of native coronary artery without angina pectoris: Secondary | ICD-10-CM | POA: Diagnosis not present

## 2018-03-17 DIAGNOSIS — R9389 Abnormal findings on diagnostic imaging of other specified body structures: Secondary | ICD-10-CM | POA: Diagnosis not present

## 2018-03-17 DIAGNOSIS — R0602 Shortness of breath: Secondary | ICD-10-CM | POA: Diagnosis not present

## 2018-03-17 DIAGNOSIS — I10 Essential (primary) hypertension: Secondary | ICD-10-CM | POA: Diagnosis not present

## 2018-03-21 DIAGNOSIS — I2583 Coronary atherosclerosis due to lipid rich plaque: Secondary | ICD-10-CM | POA: Diagnosis not present

## 2018-03-21 DIAGNOSIS — I2510 Atherosclerotic heart disease of native coronary artery without angina pectoris: Secondary | ICD-10-CM | POA: Diagnosis not present

## 2018-03-21 LAB — CBC
CD4 absolute: 905 /uL (ref 359–1519)
CD4%: 34.8 % (ref 30.8–58.5)
CD8 T CELL SUPPRESSOR: 46.3 % — AB (ref 12–35.5)
CD8: 1204 /uL — AB (ref 109–897)
HCT: 44 % (ref 38–51)
HEMOGLOBIN: 14.5 g/dL (ref 13–17.7)

## 2018-03-25 HISTORY — PX: MITRAL VALVE REPLACEMENT: SHX147

## 2018-03-30 ENCOUNTER — Encounter: Payer: Self-pay | Admitting: Family

## 2018-03-30 ENCOUNTER — Ambulatory Visit: Payer: Medicare Other | Admitting: Family

## 2018-03-30 VITALS — BP 132/81 | HR 92 | Temp 98.0°F | Wt 138.0 lb

## 2018-03-30 DIAGNOSIS — B20 Human immunodeficiency virus [HIV] disease: Secondary | ICD-10-CM | POA: Diagnosis not present

## 2018-03-30 DIAGNOSIS — B181 Chronic viral hepatitis B without delta-agent: Secondary | ICD-10-CM

## 2018-03-30 MED ORDER — EMTRICITABINE-TENOFOVIR AF 200-25 MG PO TABS: 1.0000 | ORAL_TABLET | Freq: Every day | ORAL | 5 refills | Status: DC

## 2018-03-30 MED ORDER — DOLUTEGRAVIR SODIUM 50 MG PO TABS: ORAL_TABLET | ORAL | 5 refills | Status: DC

## 2018-03-30 NOTE — Progress Notes (Signed)
Subjective:    Patient ID: Dale Mcdonald, male    DOB: 02/16/1951, 67 y.o.   MRN: [id number]  Chief Complaint  Patient presents with  . HIV Positive/AIDS     HPI:  Dale Mcdonald is a 67 y.o. male who presents today for routine follow-up of HIV disease.  1.) HIV disease - Mr. Dale Mcdonald was last seen in the office on 03/29/2017 for routine follow-up was enrolled in Dale Mcdonald study in the Dale Mcdonald. His medication was changed to Dale Mcdonald and Dale Mcdonald.  Most recent blood work completed on 03/08/2018 with CD4 count of 905. No viral load is available for review. All immunizations are up to date per recommendations.   Mr. Dale Mcdonald has been taking his Dale Mcdonald and Dale Mcdonald as prescribed with no adverse side effects or missed doses. He has no problems obtaining his medication from Dale Mcdonald and continues to be covered by Dale Mcdonald. Denies fevers, chills, night sweats, headaches, changes in vision, neck pain/stiffness, nausea, diarrhea, vomiting, lesions or rashes.  2.) Hepatitis B - Mr. Dale Mcdonald continues to take his Dale Mcdonald which covers his Hepatitis B without adverse side effects or missed doses. Currently asymptomatic and denies abdominal pain, nausea, vomiting, ease of bleeding, jaundice or scleral icterus.    No Known Allergies    Outpatient Medications Prior to Visit  Medication Sig Dispense Refill  . amLODipine (NORVASC) 5 MG tablet TAKE 1 TABLET(5 MG) BY MOUTH DAILY (Patient taking differently: Take 5 mg by mouth at bedtime. TAKE 1 TABLET(5 MG) BY MOUTH DAILY) 90 tablet 3  . aspirin EC 81 MG tablet Take 81 mg by mouth daily.    Marland Kitchen atorvastatin (LIPITOR) 20 MG tablet Take 1 tablet (20 mg total) by mouth daily. (Patient taking differently: Take 20 mg by mouth at bedtime. ) 90 tablet 2  . ibuprofen (ADVIL,MOTRIN) 200 MG tablet Take 600 mg by mouth every 8 (eight) hours as needed (for pain.).    Marland Kitchen lisinopril-hydrochlorothiazide (PRINZIDE,ZESTORETIC) 20-25 MG tablet Take 1 tablet by mouth  daily. 90 tablet 3  . Melatonin 5 MG TABS Take 5 mg by mouth at bedtime.     . valACYclovir (VALTREX) 500 MG tablet Take 1 tablet (500 mg total) by mouth daily. (Patient taking differently: Take 500 mg by mouth daily as needed (for fever blisters.). ) 30 tablet 11  . Dale Mcdonald 200-25 MG tablet TAKE 1 TABLET BY MOUTH DAILY 30 tablet 1  . Dale Mcdonald 50 MG tablet TAKE 1 TABLET(50 MG) BY MOUTH DAILY 30 tablet 1   No facility-administered medications prior to visit.      Past Medical History:  Diagnosis Date  . Anxiety   . Anxiety 03/29/2017  . Heart murmur   . Hepatitis    B  . HIV (human immunodeficiency virus infection) ([place])   . Hyperlipidemia   . Hypertension   . Palpitations      Past Surgical History:  Procedure Laterality Date  . CATARACT EXTRACTION W/PHACO Left 11/11/2017   Procedure: CATARACT EXTRACTION PHACO AND INTRAOCULAR LENS PLACEMENT (IOC);  Surgeon: Birder Robson, MD;  Location: [facility] ORS;  Service: Ophthalmology;  Laterality: Left;  [place] 00:59.3 AP% 19.5 CDE 11.54 Fluid pack lot # [id number]  . CATARACT EXTRACTION W/PHACO Right 12/21/2017   Procedure: CATARACT EXTRACTION PHACO AND INTRAOCULAR LENS PLACEMENT (IOC);  Surgeon: Birder Robson, MD;  Location: [facility] ORS;  Service: Ophthalmology;  Laterality: Right;  [place] 00:40 AP% 13.5 CDE 5.50 Fluid pack lot $ [id number]  . COLONOSCOPY    . COLONOSCOPY  WITH PROPOFOL N/A 02/08/2018   Procedure: COLONOSCOPY WITH PROPOFOL;  Surgeon: Jonathon Bellows, MD;  Location: [facility] ENDOSCOPY;  Service: Gastroenterology;  Laterality: N/A;  . EYE SURGERY     RETINA  . HERNIA REPAIR         Review of Systems  Constitutional: Negative for appetite change, chills, fatigue, fever and unexpected weight change.  Eyes: Negative for visual disturbance.  Respiratory: Negative for cough, chest tightness, shortness of breath and wheezing.   Cardiovascular: Negative for chest pain and leg swelling.  Gastrointestinal: Negative for abdominal pain,  constipation, diarrhea, nausea and vomiting.  Genitourinary: Negative for dysuria, flank pain, frequency, genital sores, hematuria and urgency.  Skin: Negative for rash.  Allergic/Immunologic: Negative for immunocompromised state.  Neurological: Negative for dizziness and headaches.      Objective:    BP 132/81   Pulse 92   Temp 98 F (36.7 C)   Wt 138 lb (62.6 kg)   BMI 19.80 kg/m  Nursing note and vital signs reviewed.  Physical Exam  Constitutional: He is oriented to person, place, and time. He appears well-developed. No distress.  HENT:  Mouth/Throat: Oropharynx is clear and moist.  Eyes: Conjunctivae are normal.  Neck: Neck supple.  Cardiovascular: Normal rate, regular rhythm, normal heart sounds and intact distal pulses. Exam reveals no gallop and no friction rub.  No murmur heard. Pulmonary/Chest: Effort normal and breath sounds normal. No respiratory distress. He has no wheezes. He has no rales. He exhibits no tenderness.  Abdominal: Soft. Bowel sounds are normal. There is no tenderness.  Lymphadenopathy:    He has no cervical adenopathy.  Neurological: He is alert and oriented to person, place, and time.  Skin: Skin is warm and dry. No rash noted.  Psychiatric: He has a normal mood and affect. His behavior is normal. Judgment and thought content normal.       Assessment & Plan:   Problem List Items Addressed This Visit      Digestive   Chronic hepatitis B virus infection (Dale Mcdonald)    Mr. Dale Mcdonald Hepatitis B is stable at present with current Dale Mcdonald. Advised to ensure not to stop medication abruptly which could result in Hepatitis B flare. He is asymptomatic. Continue to monitor.       Relevant Medications   emtricitabine-tenofovir AF (Dale Mcdonald) 200-25 MG tablet   dolutegravir (Dale Mcdonald) 50 MG tablet     Other   Human immunodeficiency virus (HIV) disease (Dale Mcdonald) - Primary    Mr. Dale Mcdonald has well controlled HIV disease with his current regimen of Dale Mcdonald and Dale Mcdonald.  He has about a 1 year supply of medication at home and would like to stay on this regimen. He has no adverse side effects or missed doses. No signs/symptoms of opportunistic infection or progressive HIV disease at present. All immunizations are up to date at present. Check viral load today. Continue current dose of Dale Mcdonald and Dale Mcdonald. Plan for office follow up in 1 year or sooner if needed with lab work 1-2 weeks prior to appointment.       Relevant Medications   emtricitabine-tenofovir AF (Dale Mcdonald) 200-25 MG tablet   dolutegravir (Dale Mcdonald) 50 MG tablet   Other Relevant Orders   HIV-1 RNA quant-no reflex-bld   T-helper cell (CD4)- ([facility] clinic only)   Comprehensive metabolic panel   CBC   Lipid panel   RPR   HIV-1 RNA quant-no reflex-bld       I have changed [facility] Dale Mcdonald to emtricitabine-tenofovir AF and Dale Mcdonald  to dolutegravir. I am also having him maintain his aspirin EC, amLODipine, lisinopril-hydrochlorothiazide, atorvastatin, valACYclovir, Melatonin, and ibuprofen.   Meds ordered this encounter  Medications  . emtricitabine-tenofovir AF (Dale Mcdonald) 200-25 MG tablet    Sig: Take 1 tablet by mouth daily.    Dispense:  30 tablet    Refill:  5    Order Specific Question:   Supervising Provider    Answer:   Carlyle Basques [[id number]]  . dolutegravir (Dale Mcdonald) 50 MG tablet    Sig: TAKE 1 TABLET(50 MG) BY MOUTH DAILY    Dispense:  30 tablet    Refill:  5    Order Specific Question:   Supervising Provider    Answer:   Carlyle Basques [[id number]]     Follow-up: Return in about 1 year (around 03/31/2019), or if symptoms worsen or fail to improve.   Terri Piedra, MSN, FNP-C Nurse Practitioner [facility] for Infectious Disease [facility] Office phone: [phone] Pager: [place] number: [phone]

## 2018-03-30 NOTE — Patient Instructions (Signed)
Nice to see you.  We will check your viral load today.   Continue to take your Tivicay and Descovy.   Plan for follow up in 1 year or sooner if needed with lab work 1-2 weeks prior to your appointment.   Best wishes for your upcoming surgery.

## 2018-03-30 NOTE — Assessment & Plan Note (Signed)
Dale Mcdonald has well controlled HIV disease with his current regimen of Tivicay and Descovy. He has about a 1 year supply of medication at home and would like to stay on this regimen. He has no adverse side effects or missed doses. No signs/symptoms of opportunistic infection or progressive HIV disease at present. All immunizations are up to date at present. Check viral load today. Continue current dose of Tivicay and Descovy. Plan for office follow up in 1 year or sooner if needed with lab work 1-2 weeks prior to appointment.

## 2018-03-30 NOTE — Assessment & Plan Note (Signed)
Dale Mcdonald Hepatitis B is stable at present with current Descovy. Advised to ensure not to stop medication abruptly which could result in Hepatitis B flare. He is asymptomatic. Continue to monitor.

## 2018-04-01 LAB — HIV-1 RNA QUANT-NO REFLEX-BLD
HIV 1 RNA QUANT: NOT DETECTED {copies}/mL
HIV-1 RNA Quant, Log: <1.3 Log copies/mL

## 2018-04-02 DIAGNOSIS — Z7982 Long term (current) use of aspirin: Secondary | ICD-10-CM | POA: Diagnosis not present

## 2018-04-02 DIAGNOSIS — I119 Hypertensive heart disease without heart failure: Secondary | ICD-10-CM | POA: Diagnosis not present

## 2018-04-02 DIAGNOSIS — J811 Chronic pulmonary edema: Secondary | ICD-10-CM | POA: Diagnosis not present

## 2018-04-02 DIAGNOSIS — I672 Cerebral atherosclerosis: Secondary | ICD-10-CM | POA: Diagnosis not present

## 2018-04-02 DIAGNOSIS — J432 Centrilobular emphysema: Secondary | ICD-10-CM | POA: Diagnosis not present

## 2018-04-02 DIAGNOSIS — R0602 Shortness of breath: Secondary | ICD-10-CM | POA: Diagnosis not present

## 2018-04-02 DIAGNOSIS — I2510 Atherosclerotic heart disease of native coronary artery without angina pectoris: Secondary | ICD-10-CM | POA: Diagnosis not present

## 2018-04-02 DIAGNOSIS — I6381 Other cerebral infarction due to occlusion or stenosis of small artery: Secondary | ICD-10-CM | POA: Diagnosis not present

## 2018-04-02 DIAGNOSIS — J810 Acute pulmonary edema: Secondary | ICD-10-CM | POA: Diagnosis not present

## 2018-04-02 DIAGNOSIS — J9601 Acute respiratory failure with hypoxia: Secondary | ICD-10-CM | POA: Diagnosis not present

## 2018-04-02 DIAGNOSIS — E785 Hyperlipidemia, unspecified: Secondary | ICD-10-CM | POA: Diagnosis not present

## 2018-04-02 DIAGNOSIS — R40243 Glasgow coma scale score 3-8, unspecified time: Secondary | ICD-10-CM | POA: Diagnosis not present

## 2018-04-02 DIAGNOSIS — I4589 Other specified conduction disorders: Secondary | ICD-10-CM | POA: Diagnosis not present

## 2018-04-02 DIAGNOSIS — J90 Pleural effusion, not elsewhere classified: Secondary | ICD-10-CM | POA: Diagnosis not present

## 2018-04-02 DIAGNOSIS — Z87891 Personal history of nicotine dependence: Secondary | ICD-10-CM | POA: Diagnosis not present

## 2018-04-02 DIAGNOSIS — R9431 Abnormal electrocardiogram [ECG] [EKG]: Secondary | ICD-10-CM | POA: Diagnosis not present

## 2018-04-02 DIAGNOSIS — R000 Tachycardia, unspecified: Secondary | ICD-10-CM | POA: Diagnosis not present

## 2018-04-03 DIAGNOSIS — J939 Pneumothorax, unspecified: Secondary | ICD-10-CM | POA: Diagnosis not present

## 2018-04-03 DIAGNOSIS — Z7401 Bed confinement status: Secondary | ICD-10-CM | POA: Diagnosis not present

## 2018-04-03 DIAGNOSIS — I420 Dilated cardiomyopathy: Secondary | ICD-10-CM | POA: Diagnosis not present

## 2018-04-03 DIAGNOSIS — J984 Other disorders of lung: Secondary | ICD-10-CM | POA: Diagnosis not present

## 2018-04-03 DIAGNOSIS — J811 Chronic pulmonary edema: Secondary | ICD-10-CM | POA: Diagnosis not present

## 2018-04-03 DIAGNOSIS — E871 Hypo-osmolality and hyponatremia: Secondary | ICD-10-CM | POA: Diagnosis not present

## 2018-04-03 DIAGNOSIS — D62 Acute posthemorrhagic anemia: Secondary | ICD-10-CM | POA: Diagnosis not present

## 2018-04-03 DIAGNOSIS — B370 Candidal stomatitis: Secondary | ICD-10-CM | POA: Diagnosis not present

## 2018-04-03 DIAGNOSIS — J810 Acute pulmonary edema: Secondary | ICD-10-CM | POA: Diagnosis not present

## 2018-04-03 DIAGNOSIS — R40243 Glasgow coma scale score 3-8, unspecified time: Secondary | ICD-10-CM | POA: Diagnosis not present

## 2018-04-03 DIAGNOSIS — Z951 Presence of aortocoronary bypass graft: Secondary | ICD-10-CM | POA: Diagnosis not present

## 2018-04-03 DIAGNOSIS — R918 Other nonspecific abnormal finding of lung field: Secondary | ICD-10-CM | POA: Diagnosis not present

## 2018-04-03 DIAGNOSIS — I2510 Atherosclerotic heart disease of native coronary artery without angina pectoris: Secondary | ICD-10-CM | POA: Diagnosis not present

## 2018-04-03 DIAGNOSIS — E782 Mixed hyperlipidemia: Secondary | ICD-10-CM | POA: Diagnosis not present

## 2018-04-03 DIAGNOSIS — I110 Hypertensive heart disease with heart failure: Secondary | ICD-10-CM | POA: Diagnosis not present

## 2018-04-03 DIAGNOSIS — I5023 Acute on chronic systolic (congestive) heart failure: Secondary | ICD-10-CM | POA: Diagnosis not present

## 2018-04-03 DIAGNOSIS — Z8679 Personal history of other diseases of the circulatory system: Secondary | ICD-10-CM | POA: Diagnosis not present

## 2018-04-03 DIAGNOSIS — I340 Nonrheumatic mitral (valve) insufficiency: Secondary | ICD-10-CM | POA: Diagnosis not present

## 2018-04-03 DIAGNOSIS — E861 Hypovolemia: Secondary | ICD-10-CM | POA: Diagnosis not present

## 2018-04-03 DIAGNOSIS — R34 Anuria and oliguria: Secondary | ICD-10-CM | POA: Diagnosis not present

## 2018-04-03 DIAGNOSIS — J432 Centrilobular emphysema: Secondary | ICD-10-CM | POA: Diagnosis not present

## 2018-04-03 DIAGNOSIS — J90 Pleural effusion, not elsewhere classified: Secondary | ICD-10-CM | POA: Diagnosis not present

## 2018-04-03 DIAGNOSIS — Z7982 Long term (current) use of aspirin: Secondary | ICD-10-CM | POA: Diagnosis not present

## 2018-04-03 DIAGNOSIS — I10 Essential (primary) hypertension: Secondary | ICD-10-CM | POA: Diagnosis not present

## 2018-04-03 DIAGNOSIS — K769 Liver disease, unspecified: Secondary | ICD-10-CM | POA: Diagnosis not present

## 2018-04-03 DIAGNOSIS — E785 Hyperlipidemia, unspecified: Secondary | ICD-10-CM | POA: Diagnosis not present

## 2018-04-03 DIAGNOSIS — I25110 Atherosclerotic heart disease of native coronary artery with unstable angina pectoris: Secondary | ICD-10-CM | POA: Diagnosis not present

## 2018-04-03 DIAGNOSIS — R9431 Abnormal electrocardiogram [ECG] [EKG]: Secondary | ICD-10-CM | POA: Diagnosis not present

## 2018-04-03 DIAGNOSIS — Z952 Presence of prosthetic heart valve: Secondary | ICD-10-CM | POA: Diagnosis not present

## 2018-04-03 DIAGNOSIS — Z87891 Personal history of nicotine dependence: Secondary | ICD-10-CM | POA: Diagnosis not present

## 2018-04-03 DIAGNOSIS — G8918 Other acute postprocedural pain: Secondary | ICD-10-CM | POA: Diagnosis not present

## 2018-04-03 DIAGNOSIS — J9811 Atelectasis: Secondary | ICD-10-CM | POA: Diagnosis not present

## 2018-04-03 DIAGNOSIS — I4589 Other specified conduction disorders: Secondary | ICD-10-CM | POA: Diagnosis not present

## 2018-04-03 DIAGNOSIS — I672 Cerebral atherosclerosis: Secondary | ICD-10-CM | POA: Diagnosis not present

## 2018-04-03 DIAGNOSIS — I5020 Unspecified systolic (congestive) heart failure: Secondary | ICD-10-CM | POA: Diagnosis not present

## 2018-04-03 DIAGNOSIS — I2583 Coronary atherosclerosis due to lipid rich plaque: Secondary | ICD-10-CM | POA: Diagnosis not present

## 2018-04-03 DIAGNOSIS — Z9911 Dependence on respirator [ventilator] status: Secondary | ICD-10-CM | POA: Diagnosis not present

## 2018-04-03 DIAGNOSIS — J9601 Acute respiratory failure with hypoxia: Secondary | ICD-10-CM | POA: Insufficient documentation

## 2018-04-03 DIAGNOSIS — N179 Acute kidney failure, unspecified: Secondary | ICD-10-CM | POA: Diagnosis not present

## 2018-04-03 DIAGNOSIS — I119 Hypertensive heart disease without heart failure: Secondary | ICD-10-CM | POA: Diagnosis not present

## 2018-04-03 DIAGNOSIS — I6381 Other cerebral infarction due to occlusion or stenosis of small artery: Secondary | ICD-10-CM | POA: Diagnosis not present

## 2018-04-03 DIAGNOSIS — Z9889 Other specified postprocedural states: Secondary | ICD-10-CM | POA: Diagnosis not present

## 2018-04-03 DIAGNOSIS — I959 Hypotension, unspecified: Secondary | ICD-10-CM | POA: Diagnosis not present

## 2018-04-03 DIAGNOSIS — I2582 Chronic total occlusion of coronary artery: Secondary | ICD-10-CM | POA: Diagnosis not present

## 2018-04-03 DIAGNOSIS — R570 Cardiogenic shock: Secondary | ICD-10-CM | POA: Diagnosis not present

## 2018-04-04 DIAGNOSIS — I25119 Atherosclerotic heart disease of native coronary artery with unspecified angina pectoris: Secondary | ICD-10-CM | POA: Insufficient documentation

## 2018-04-04 DIAGNOSIS — I2510 Atherosclerotic heart disease of native coronary artery without angina pectoris: Secondary | ICD-10-CM | POA: Insufficient documentation

## 2018-04-05 DIAGNOSIS — R0689 Other abnormalities of breathing: Secondary | ICD-10-CM | POA: Insufficient documentation

## 2018-04-05 DIAGNOSIS — R531 Weakness: Secondary | ICD-10-CM | POA: Insufficient documentation

## 2018-04-05 DIAGNOSIS — Z789 Other specified health status: Secondary | ICD-10-CM | POA: Insufficient documentation

## 2018-04-05 DIAGNOSIS — R6889 Other general symptoms and signs: Secondary | ICD-10-CM | POA: Insufficient documentation

## 2018-04-07 HISTORY — PX: CORONARY ARTERY BYPASS GRAFT: SHX141

## 2018-04-08 ENCOUNTER — Encounter: Payer: Self-pay | Admitting: *Deleted

## 2018-04-08 LAB — HIV-1 RNA QUANT-NO REFLEX-BLD: HIV-1 RNA Viral Load: <40

## 2018-04-09 ENCOUNTER — Other Ambulatory Visit: Payer: Self-pay | Admitting: Internal Medicine

## 2018-04-10 DIAGNOSIS — Z951 Presence of aortocoronary bypass graft: Secondary | ICD-10-CM | POA: Insufficient documentation

## 2018-04-10 DIAGNOSIS — Z953 Presence of xenogenic heart valve: Secondary | ICD-10-CM | POA: Insufficient documentation

## 2018-04-12 DIAGNOSIS — R9430 Abnormal result of cardiovascular function study, unspecified: Secondary | ICD-10-CM | POA: Insufficient documentation

## 2018-04-12 DIAGNOSIS — IMO0002 Reserved for concepts with insufficient information to code with codable children: Secondary | ICD-10-CM | POA: Insufficient documentation

## 2018-04-13 MED ORDER — DEXTROSE 50 % IV SOLN
12.50 | INTRAVENOUS | Status: DC
Start: ? — End: 2018-04-13

## 2018-04-13 MED ORDER — ATORVASTATIN CALCIUM 20 MG PO TABS
20.00 | ORAL_TABLET | ORAL | Status: DC
Start: 2018-04-14 — End: 2018-04-13

## 2018-04-13 MED ORDER — OXYBUTYNIN CHLORIDE 5 MG PO TABS
5.00 | ORAL_TABLET | ORAL | Status: DC
Start: ? — End: 2018-04-13

## 2018-04-13 MED ORDER — GABAPENTIN 100 MG PO CAPS
100.00 | ORAL_CAPSULE | ORAL | Status: DC
Start: 2018-04-13 — End: 2018-04-13

## 2018-04-13 MED ORDER — DOLUTEGRAVIR SODIUM 50 MG PO TABS
50.00 | ORAL_TABLET | ORAL | Status: DC
Start: 2018-04-14 — End: 2018-04-13

## 2018-04-13 MED ORDER — EMTRICITABINE-TENOFOVIR AF 200-25 MG PO TABS
1.00 | ORAL_TABLET | ORAL | Status: DC
Start: 2018-04-14 — End: 2018-04-13

## 2018-04-13 MED ORDER — ACETAMINOPHEN 325 MG PO TABS
975.00 | ORAL_TABLET | ORAL | Status: DC
Start: 2018-04-13 — End: 2018-04-13

## 2018-04-13 MED ORDER — GLUCAGON HCL RDNA (DIAGNOSTIC) 1 MG IJ SOLR
1.00 | INTRAMUSCULAR | Status: DC
Start: ? — End: 2018-04-13

## 2018-04-13 MED ORDER — SENNOSIDES-DOCUSATE SODIUM 8.6-50 MG PO TABS
2.00 | ORAL_TABLET | ORAL | Status: DC
Start: 2018-04-14 — End: 2018-04-13

## 2018-04-13 MED ORDER — FUROSEMIDE 40 MG PO TABS
40.00 | ORAL_TABLET | ORAL | Status: DC
Start: 2018-04-14 — End: 2018-04-13

## 2018-04-13 MED ORDER — POTASSIUM CHLORIDE ER 20 MEQ PO TBCR
20.00 | EXTENDED_RELEASE_TABLET | ORAL | Status: DC
Start: 2018-04-14 — End: 2018-04-13

## 2018-04-13 MED ORDER — NYSTATIN 100000 UNIT/ML MT SUSP
5.00 | OROMUCOSAL | Status: DC
Start: 2018-04-13 — End: 2018-04-13

## 2018-04-13 MED ORDER — PANTOPRAZOLE SODIUM 40 MG PO TBEC
40.00 | DELAYED_RELEASE_TABLET | ORAL | Status: DC
Start: 2018-04-14 — End: 2018-04-13

## 2018-04-13 MED ORDER — POLYETHYLENE GLYCOL 3350 17 G PO PACK
17.00 | PACK | ORAL | Status: DC
Start: 2018-04-14 — End: 2018-04-13

## 2018-04-13 MED ORDER — TAMSULOSIN HCL 0.4 MG PO CAPS
0.40 | ORAL_CAPSULE | ORAL | Status: DC
Start: 2018-04-13 — End: 2018-04-13

## 2018-04-13 MED ORDER — OXYCODONE HCL 5 MG PO TABS
5.00 | ORAL_TABLET | ORAL | Status: DC
Start: ? — End: 2018-04-13

## 2018-04-13 MED ORDER — LISINOPRIL 5 MG PO TABS
5.00 | ORAL_TABLET | ORAL | Status: DC
Start: 2018-04-14 — End: 2018-04-13

## 2018-04-13 MED ORDER — ASPIRIN 81 MG PO CHEW
81.00 | CHEWABLE_TABLET | ORAL | Status: DC
Start: 2018-04-14 — End: 2018-04-13

## 2018-04-13 MED ORDER — DICLOFENAC EPOLAMINE 1.3 % TD PTCH
1.00 | MEDICATED_PATCH | TRANSDERMAL | Status: DC
Start: 2018-04-13 — End: 2018-04-13

## 2018-04-14 ENCOUNTER — Telehealth: Payer: Self-pay

## 2018-04-14 DIAGNOSIS — I25119 Atherosclerotic heart disease of native coronary artery with unspecified angina pectoris: Secondary | ICD-10-CM

## 2018-04-14 NOTE — Telephone Encounter (Signed)
I made him a lab visit and built the labs, but I was not sure of the diagnosis

## 2018-04-14 NOTE — Telephone Encounter (Signed)
Spoke to pt to see how he was doing after recent CABG. He said he was feeling pretty good. They told him he needed to see if his PCP would order a BMET and CBC to be done before his appt with them on 04-27-18. Asking if Dr Silvio Pate will do the orders.

## 2018-04-14 NOTE — Telephone Encounter (Signed)
Yes---you can set this up

## 2018-04-19 ENCOUNTER — Other Ambulatory Visit (INDEPENDENT_AMBULATORY_CARE_PROVIDER_SITE_OTHER): Payer: Medicare Other

## 2018-04-19 DIAGNOSIS — I25119 Atherosclerotic heart disease of native coronary artery with unspecified angina pectoris: Secondary | ICD-10-CM

## 2018-04-19 LAB — BASIC METABOLIC PANEL
BUN: 20 mg/dL (ref 6–23)
CHLORIDE: 99 meq/L (ref 96–112)
CO2: 25 mEq/L (ref 19–32)
CREATININE: 1.41 mg/dL (ref 0.40–1.50)
Calcium: 9 mg/dL (ref 8.4–10.5)
GFR: 53.33 mL/min — AB (ref >60.00)
Glucose, Bld: 102 mg/dL — ABNORMAL HIGH (ref 70–99)
POTASSIUM: 4.8 meq/L (ref 3.5–5.1)
Sodium: 134 mEq/L — ABNORMAL LOW (ref 135–145)

## 2018-04-19 LAB — CBC
HCT: 25.8 % — ABNORMAL LOW (ref 39.0–52.0)
Hemoglobin: 8.6 g/dL — ABNORMAL LOW (ref 13.0–17.0)
MCHC: 33.2 g/dL (ref 30.0–36.0)
MCV: 90.5 fl (ref 78.0–100.0)
Platelets: 442 10*3/uL — ABNORMAL HIGH (ref 150.0–400.0)
RBC: 2.85 Mil/uL — ABNORMAL LOW (ref 4.22–5.81)
RDW: 15.2 % (ref 11.5–15.5)
WBC: 9.6 10*3/uL (ref 4.0–10.5)

## 2018-04-27 DIAGNOSIS — K219 Gastro-esophageal reflux disease without esophagitis: Secondary | ICD-10-CM | POA: Diagnosis not present

## 2018-04-27 DIAGNOSIS — Z7982 Long term (current) use of aspirin: Secondary | ICD-10-CM | POA: Diagnosis not present

## 2018-04-27 DIAGNOSIS — D509 Iron deficiency anemia, unspecified: Secondary | ICD-10-CM | POA: Diagnosis not present

## 2018-04-27 DIAGNOSIS — Z951 Presence of aortocoronary bypass graft: Secondary | ICD-10-CM | POA: Diagnosis not present

## 2018-04-27 DIAGNOSIS — R070 Pain in throat: Secondary | ICD-10-CM | POA: Diagnosis not present

## 2018-04-27 DIAGNOSIS — N183 Chronic kidney disease, stage 3 (moderate): Secondary | ICD-10-CM | POA: Diagnosis not present

## 2018-04-27 DIAGNOSIS — I340 Nonrheumatic mitral (valve) insufficiency: Secondary | ICD-10-CM | POA: Diagnosis not present

## 2018-04-27 DIAGNOSIS — T462X5A Adverse effect of other antidysrhythmic drugs, initial encounter: Secondary | ICD-10-CM | POA: Diagnosis not present

## 2018-04-27 DIAGNOSIS — I10 Essential (primary) hypertension: Secondary | ICD-10-CM | POA: Diagnosis not present

## 2018-04-27 DIAGNOSIS — R918 Other nonspecific abnormal finding of lung field: Secondary | ICD-10-CM | POA: Diagnosis not present

## 2018-04-27 DIAGNOSIS — I2583 Coronary atherosclerosis due to lipid rich plaque: Secondary | ICD-10-CM | POA: Diagnosis not present

## 2018-04-27 DIAGNOSIS — E8770 Fluid overload, unspecified: Secondary | ICD-10-CM | POA: Diagnosis not present

## 2018-04-27 DIAGNOSIS — E782 Mixed hyperlipidemia: Secondary | ICD-10-CM | POA: Diagnosis not present

## 2018-04-27 DIAGNOSIS — I2510 Atherosclerotic heart disease of native coronary artery without angina pectoris: Secondary | ICD-10-CM | POA: Diagnosis not present

## 2018-04-27 DIAGNOSIS — R7989 Other specified abnormal findings of blood chemistry: Secondary | ICD-10-CM | POA: Diagnosis not present

## 2018-04-27 DIAGNOSIS — I4891 Unspecified atrial fibrillation: Secondary | ICD-10-CM | POA: Diagnosis not present

## 2018-04-27 DIAGNOSIS — R112 Nausea with vomiting, unspecified: Secondary | ICD-10-CM | POA: Diagnosis not present

## 2018-04-27 DIAGNOSIS — Z9889 Other specified postprocedural states: Secondary | ICD-10-CM | POA: Diagnosis not present

## 2018-04-27 DIAGNOSIS — I5023 Acute on chronic systolic (congestive) heart failure: Secondary | ICD-10-CM | POA: Diagnosis not present

## 2018-04-27 DIAGNOSIS — I447 Left bundle-branch block, unspecified: Secondary | ICD-10-CM | POA: Diagnosis not present

## 2018-04-27 DIAGNOSIS — Z953 Presence of xenogenic heart valve: Secondary | ICD-10-CM | POA: Diagnosis not present

## 2018-04-27 DIAGNOSIS — I5043 Acute on chronic combined systolic (congestive) and diastolic (congestive) heart failure: Secondary | ICD-10-CM | POA: Diagnosis not present

## 2018-04-27 DIAGNOSIS — R1310 Dysphagia, unspecified: Secondary | ICD-10-CM | POA: Diagnosis not present

## 2018-04-27 DIAGNOSIS — Z952 Presence of prosthetic heart valve: Secondary | ICD-10-CM | POA: Diagnosis not present

## 2018-04-27 DIAGNOSIS — I4819 Other persistent atrial fibrillation: Secondary | ICD-10-CM | POA: Diagnosis not present

## 2018-04-27 DIAGNOSIS — I480 Paroxysmal atrial fibrillation: Secondary | ICD-10-CM | POA: Diagnosis not present

## 2018-04-27 DIAGNOSIS — I4892 Unspecified atrial flutter: Secondary | ICD-10-CM | POA: Diagnosis not present

## 2018-04-27 DIAGNOSIS — I130 Hypertensive heart and chronic kidney disease with heart failure and stage 1 through stage 4 chronic kidney disease, or unspecified chronic kidney disease: Secondary | ICD-10-CM | POA: Diagnosis not present

## 2018-05-01 MED ORDER — DOLUTEGRAVIR SODIUM 50 MG PO TABS
50.00 | ORAL_TABLET | ORAL | Status: DC
Start: 2018-05-02 — End: 2018-05-01

## 2018-05-01 MED ORDER — ASPIRIN EC 81 MG PO TBEC
81.00 | DELAYED_RELEASE_TABLET | ORAL | Status: DC
Start: 2018-05-02 — End: 2018-05-01

## 2018-05-01 MED ORDER — ACETAMINOPHEN 325 MG PO TABS
975.00 | ORAL_TABLET | ORAL | Status: DC
Start: ? — End: 2018-05-01

## 2018-05-01 MED ORDER — MELATONIN 3 MG PO TABS
6.00 | ORAL_TABLET | ORAL | Status: DC
Start: 2018-05-01 — End: 2018-05-01

## 2018-05-01 MED ORDER — LIDOCAINE HCL 1 % IJ SOLN
0.50 | INTRAMUSCULAR | Status: DC
Start: ? — End: 2018-05-01

## 2018-05-01 MED ORDER — VALACYCLOVIR HCL 500 MG PO TABS
500.00 | ORAL_TABLET | ORAL | Status: DC
Start: 2018-05-01 — End: 2018-05-01

## 2018-05-01 MED ORDER — ATORVASTATIN CALCIUM 20 MG PO TABS
20.00 | ORAL_TABLET | ORAL | Status: DC
Start: 2018-05-02 — End: 2018-05-01

## 2018-05-01 MED ORDER — GABAPENTIN 100 MG PO CAPS
100.00 | ORAL_CAPSULE | ORAL | Status: DC
Start: 2018-05-01 — End: 2018-05-01

## 2018-05-01 MED ORDER — TRAZODONE HCL 50 MG PO TABS
25.00 | ORAL_TABLET | ORAL | Status: DC
Start: ? — End: 2018-05-01

## 2018-05-01 MED ORDER — FIRST-MOUTHWASH BLM MT SUSP
5.00 | OROMUCOSAL | Status: DC
Start: ? — End: 2018-05-01

## 2018-05-01 MED ORDER — APIXABAN 5 MG PO TABS
5.00 | ORAL_TABLET | ORAL | Status: DC
Start: 2018-05-01 — End: 2018-05-01

## 2018-05-01 MED ORDER — NYSTATIN 100000 UNIT/ML MT SUSP
5.00 | OROMUCOSAL | Status: DC
Start: 2018-05-01 — End: 2018-05-01

## 2018-05-01 MED ORDER — PANTOPRAZOLE SODIUM 40 MG PO TBEC
40.00 | DELAYED_RELEASE_TABLET | ORAL | Status: DC
Start: 2018-05-02 — End: 2018-05-01

## 2018-05-01 MED ORDER — LISINOPRIL 5 MG PO TABS
5.00 | ORAL_TABLET | ORAL | Status: DC
Start: 2018-05-02 — End: 2018-05-01

## 2018-05-01 MED ORDER — GENERIC EXTERNAL MEDICATION
12.50 | Status: DC
Start: 2018-05-01 — End: 2018-05-01

## 2018-05-01 MED ORDER — EMTRICITABINE-TENOFOVIR AF 200-25 MG PO TABS
1.00 | ORAL_TABLET | ORAL | Status: DC
Start: 2018-05-02 — End: 2018-05-01

## 2018-05-05 ENCOUNTER — Ambulatory Visit (INDEPENDENT_AMBULATORY_CARE_PROVIDER_SITE_OTHER): Payer: Medicare Other | Admitting: Internal Medicine

## 2018-05-05 ENCOUNTER — Encounter: Payer: Self-pay | Admitting: Internal Medicine

## 2018-05-05 VITALS — BP 138/70 | HR 69 | Temp 97.9°F | Ht 70.5 in | Wt 182.0 lb

## 2018-05-05 DIAGNOSIS — I5022 Chronic systolic (congestive) heart failure: Secondary | ICD-10-CM | POA: Diagnosis not present

## 2018-05-05 DIAGNOSIS — I480 Paroxysmal atrial fibrillation: Secondary | ICD-10-CM | POA: Diagnosis not present

## 2018-05-05 NOTE — Assessment & Plan Note (Signed)
Post op Now back regular after the cardioversion On eliquis Will have follow up at [facility]

## 2018-05-05 NOTE — Progress Notes (Signed)
Subjective:    Patient ID: Dale Mcdonald, male    DOB: 1950-06-24, 67 y.o.   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 (stopped due to nausea) Was on eliquis Cardioverted 12/6 Feels good now  Fluid is still on him--but it is coming off slowly Still on torsemide No chest pain Still has some DOE with activity--but does go away quicker Some dizziness upon standing--quite a bit of changes in his BP meds  Current Outpatient Medications on File Prior to Visit  Medication Sig Dispense Refill  . acetaminophen (TYLENOL) 325 MG tablet Take by mouth.    Marland Kitchen apixaban (ELIQUIS) 5 MG TABS tablet Take 1 tablet by mouth 2 (two) times daily.    Marland Kitchen aspirin EC 81 MG tablet Take 81 mg by mouth daily.    Marland Kitchen atorvastatin (LIPITOR) 20 MG tablet TAKE 1 TABLET BY MOUTH  DAILY 90 tablet 3  . dolutegravir (TIVICAY) 50 MG tablet TAKE 1 TABLET(50 MG) BY MOUTH DAILY 30 tablet 5  . emtricitabine-tenofovir AF (DESCOVY) 200-25 MG tablet Take 1 tablet by mouth daily. 30 tablet 5  . gabapentin (NEURONTIN) 100 MG capsule Take 1 capsule by mouth 2 (two) times daily.    Marland Kitchen lisinopril (PRINIVIL,ZESTRIL) 5 MG tablet Take 1 tablet by mouth daily.    . Melatonin 5 MG TABS Take 5 mg by mouth at bedtime.     . metoprolol succinate (TOPROL-XL) 25 MG 24 hr tablet Take 1 tablet by mouth daily.    Marland Kitchen nystatin (MYCOSTATIN) 100000 UNIT/ML suspension Take 5 mLs by mouth 4 (four) times daily.    . pantoprazole (PROTONIX) 40 MG tablet Take 1 tablet by mouth daily.    . Potassium Chloride ER 20 MEQ TBCR Take 1 tablet by mouth 2 (two) times daily.    Marland Kitchen  torsemide (DEMADEX) 20 MG tablet Take 2 tablets by mouth daily.    . traZODone (DESYREL) 50 MG tablet Take 0.5 tablets by mouth daily.    . valACYclovir (VALTREX) 500 MG tablet Take 1 tablet (500 mg total) by mouth daily. (Patient taking differently: Take 500 mg by mouth daily as needed (for fever blisters.). ) 30 tablet 11   No current facility-administered medications on file prior to visit.     No Known Allergies  Past Medical History:  Diagnosis Date  . Anxiety   . Anxiety 03/29/2017  . Heart murmur   . Hepatitis    B  . HIV (human immunodeficiency virus infection) ([place])   . Hyperlipidemia   . Hypertension   . Palpitations     Past Surgical History:  Procedure Laterality Date  . CATARACT EXTRACTION W/PHACO Left 11/11/2017   Procedure: CATARACT EXTRACTION PHACO AND INTRAOCULAR LENS PLACEMENT (IOC);  Surgeon: Birder Robson, MD;  Location: [facility] ORS;  Service: Ophthalmology;  Laterality: Left;  [place] 00:59.3 AP% 19.5 CDE 11.54 Fluid pack lot # [id number]  . CATARACT EXTRACTION W/PHACO Right 12/21/2017   Procedure: CATARACT EXTRACTION PHACO AND INTRAOCULAR LENS PLACEMENT (IOC);  Surgeon: Birder Robson, MD;  Location: [facility] ORS;  Service: Ophthalmology;  Laterality: Right;  [place]  00:40 AP% 13.5 CDE 5.50 Fluid pack lot $ [id number]  . COLONOSCOPY    . COLONOSCOPY WITH PROPOFOL N/A 02/08/2018   Procedure: COLONOSCOPY WITH PROPOFOL;  Surgeon: Jonathon Bellows, MD;  Location: [facility] ENDOSCOPY;  Service: Gastroenterology;  Laterality: N/A;  . CORONARY ARTERY BYPASS GRAFT  04/07/2018  . EYE SURGERY     RETINA  . HERNIA REPAIR    . MITRAL VALVE REPLACEMENT  03/2018   bovine--with CABG    History reviewed. No pertinent family history.  Social History   Socioeconomic History  . Marital status: Widowed    Spouse name: Not on file  . Number of children: 1  . Years of education: Not on file  . Highest education level: Not on file  Occupational History  . Occupation: VICE  PRESIDENT--Purchasing---retired    Comment: [employer]  . Financial resource strain: Not on file  . Food insecurity:    Worry: Not on file    Inability: Not on file  . Transportation needs:    Medical: Not on file    Non-medical: Not on file  Tobacco Use  . Smoking status: Former Smoker    Packs/day: 1.00    Types: Cigarettes  . Smokeless tobacco: Never Used  . Tobacco comment: gave 1-800-QUIT-NOW, currently on Chantix  Substance and Sexual Activity  . Alcohol use: No    Alcohol/week: 0.0 standard drinks  . Drug use: No  . Sexual activity: Not on file  Lifestyle  . Physical activity:    Days per week: Not on file    Minutes per session: Not on file  . Stress: Not on file  Relationships  . Social connections:    Talks on phone: Not on file    Gets together: Not on file    Attends religious service: Not on file    Active member of club or organization: Not on file    Attends meetings of clubs or organizations: Not on file    Relationship status: Not on file  . Intimate partner violence:    Fear of current or ex partner: Not on file    Emotionally abused: Not on file    Physically abused: Not on file    Forced sexual activity: Not on file  Other Topics Concern  . Not on file  Social History Narrative   No living will   Would want son Dale Mcdonald to be decision maker   Would accept resuscitation   Not sure about tube feeds   Review of Systems Appetite is improving Sleeping better for the past 2 nights Hospital bed---keeps [facility] elevated a little. No PND Still a little drainage from his incisions    Objective:   Physical Exam  Constitutional: He appears well-developed. No distress.  Neck: No thyromegaly present.  Cardiovascular: Normal rate, regular rhythm and normal heart sounds. Exam reveals no gallop.  No murmur heard. Respiratory: Effort normal. He has no wheezes. He has no rales.  No inflammation around sternal wounds Dullness and  decreased breath sounds at right base  Musculoskeletal:     Comments: Trace edema in feet  Lymphadenopathy:    He has no cervical adenopathy.  Psychiatric: He has a normal mood and affect.           Assessment & Plan:

## 2018-05-05 NOTE — Assessment & Plan Note (Signed)
EF 30% on TEE Hopefully will improve over time with the CABG and MVR On diuretics Will check labs

## 2018-05-06 LAB — RENAL FUNCTION PANEL
Albumin: 3.9 g/dL (ref 3.5–5.2)
BUN: 18 mg/dL (ref 6–23)
CALCIUM: 9.6 mg/dL (ref 8.4–10.5)
CO2: 30 mEq/L (ref 19–32)
Chloride: 99 mEq/L (ref 96–112)
Creatinine, Ser: 1.2 mg/dL (ref 0.40–1.50)
GFR: 64.23 mL/min (ref >60.00)
Glucose, Bld: 63 mg/dL — ABNORMAL LOW (ref 70–99)
Phosphorus: 3.7 mg/dL (ref 2.3–4.6)
Potassium: 4 mEq/L (ref 3.5–5.1)
Sodium: 140 mEq/L (ref 135–145)

## 2018-05-06 LAB — CBC
HCT: 33.7 % — ABNORMAL LOW (ref 39.0–52.0)
Hemoglobin: 10.7 g/dL — ABNORMAL LOW (ref 13.0–17.0)
MCHC: 31.6 g/dL (ref 30.0–36.0)
MCV: 90.9 fl (ref 78.0–100.0)
Platelets: 225 10*3/uL (ref 150.0–400.0)
RBC: 3.71 Mil/uL — ABNORMAL LOW (ref 4.22–5.81)
RDW: 18.7 % — ABNORMAL HIGH (ref 11.5–15.5)
WBC: 6.9 10*3/uL (ref 4.0–10.5)

## 2018-05-06 LAB — MAGNESIUM: Magnesium: 1.8 mg/dL (ref 1.5–2.5)

## 2018-05-20 DIAGNOSIS — Z953 Presence of xenogenic heart valve: Secondary | ICD-10-CM | POA: Diagnosis not present

## 2018-05-20 DIAGNOSIS — I97130 Postprocedural heart failure following cardiac surgery: Secondary | ICD-10-CM | POA: Diagnosis not present

## 2018-05-20 DIAGNOSIS — J90 Pleural effusion, not elsewhere classified: Secondary | ICD-10-CM | POA: Diagnosis not present

## 2018-05-20 DIAGNOSIS — G8918 Other acute postprocedural pain: Secondary | ICD-10-CM | POA: Diagnosis not present

## 2018-05-20 DIAGNOSIS — R918 Other nonspecific abnormal finding of lung field: Secondary | ICD-10-CM | POA: Diagnosis not present

## 2018-05-20 DIAGNOSIS — A4102 Sepsis due to Methicillin resistant Staphylococcus aureus: Secondary | ICD-10-CM | POA: Diagnosis not present

## 2018-05-20 DIAGNOSIS — Z7901 Long term (current) use of anticoagulants: Secondary | ICD-10-CM | POA: Diagnosis not present

## 2018-05-20 DIAGNOSIS — A419 Sepsis, unspecified organism: Secondary | ICD-10-CM | POA: Diagnosis not present

## 2018-05-20 DIAGNOSIS — Z7982 Long term (current) use of aspirin: Secondary | ICD-10-CM | POA: Diagnosis not present

## 2018-05-20 DIAGNOSIS — I420 Dilated cardiomyopathy: Secondary | ICD-10-CM | POA: Diagnosis not present

## 2018-05-20 DIAGNOSIS — R140 Abdominal distension (gaseous): Secondary | ICD-10-CM | POA: Diagnosis not present

## 2018-05-20 DIAGNOSIS — K828 Other specified diseases of gallbladder: Secondary | ICD-10-CM | POA: Diagnosis not present

## 2018-05-20 DIAGNOSIS — T8142XA Infection following a procedure, deep incisional surgical site, initial encounter: Secondary | ICD-10-CM | POA: Diagnosis not present

## 2018-05-20 DIAGNOSIS — I517 Cardiomegaly: Secondary | ICD-10-CM | POA: Diagnosis not present

## 2018-05-20 DIAGNOSIS — K8020 Calculus of gallbladder without cholecystitis without obstruction: Secondary | ICD-10-CM | POA: Diagnosis not present

## 2018-05-20 DIAGNOSIS — R7881 Bacteremia: Secondary | ICD-10-CM | POA: Diagnosis not present

## 2018-05-20 DIAGNOSIS — L089 Local infection of the skin and subcutaneous tissue, unspecified: Secondary | ICD-10-CM | POA: Diagnosis not present

## 2018-05-20 DIAGNOSIS — J9811 Atelectasis: Secondary | ICD-10-CM | POA: Diagnosis not present

## 2018-05-20 DIAGNOSIS — B9561 Methicillin susceptible Staphylococcus aureus infection as the cause of diseases classified elsewhere: Secondary | ICD-10-CM | POA: Diagnosis not present

## 2018-05-20 DIAGNOSIS — Z952 Presence of prosthetic heart valve: Secondary | ICD-10-CM | POA: Diagnosis not present

## 2018-05-20 DIAGNOSIS — T8141XA Infection following a procedure, superficial incisional surgical site, initial encounter: Secondary | ICD-10-CM | POA: Diagnosis not present

## 2018-05-20 DIAGNOSIS — T8132XA Disruption of internal operation (surgical) wound, not elsewhere classified, initial encounter: Secondary | ICD-10-CM | POA: Diagnosis not present

## 2018-05-20 DIAGNOSIS — J9851 Mediastinitis: Secondary | ICD-10-CM | POA: Diagnosis not present

## 2018-05-20 DIAGNOSIS — E782 Mixed hyperlipidemia: Secondary | ICD-10-CM | POA: Diagnosis not present

## 2018-05-20 DIAGNOSIS — T82218A Other mechanical complication of coronary artery bypass graft, initial encounter: Secondary | ICD-10-CM | POA: Diagnosis not present

## 2018-05-20 DIAGNOSIS — I4892 Unspecified atrial flutter: Secondary | ICD-10-CM | POA: Diagnosis not present

## 2018-05-20 DIAGNOSIS — J432 Centrilobular emphysema: Secondary | ICD-10-CM | POA: Diagnosis not present

## 2018-05-20 DIAGNOSIS — I2510 Atherosclerotic heart disease of native coronary artery without angina pectoris: Secondary | ICD-10-CM | POA: Diagnosis not present

## 2018-05-20 DIAGNOSIS — I5022 Chronic systolic (congestive) heart failure: Secondary | ICD-10-CM | POA: Diagnosis not present

## 2018-05-20 DIAGNOSIS — Z79899 Other long term (current) drug therapy: Secondary | ICD-10-CM | POA: Diagnosis not present

## 2018-05-20 DIAGNOSIS — I4819 Other persistent atrial fibrillation: Secondary | ICD-10-CM | POA: Diagnosis not present

## 2018-05-20 DIAGNOSIS — I5043 Acute on chronic combined systolic (congestive) and diastolic (congestive) heart failure: Secondary | ICD-10-CM | POA: Diagnosis not present

## 2018-05-20 DIAGNOSIS — J95821 Acute postprocedural respiratory failure: Secondary | ICD-10-CM | POA: Diagnosis not present

## 2018-05-20 DIAGNOSIS — R0789 Other chest pain: Secondary | ICD-10-CM | POA: Diagnosis not present

## 2018-05-20 DIAGNOSIS — Z481 Encounter for planned postprocedural wound closure: Secondary | ICD-10-CM | POA: Diagnosis not present

## 2018-05-20 DIAGNOSIS — N179 Acute kidney failure, unspecified: Secondary | ICD-10-CM | POA: Diagnosis not present

## 2018-05-20 DIAGNOSIS — R6521 Severe sepsis with septic shock: Secondary | ICD-10-CM | POA: Diagnosis not present

## 2018-05-20 DIAGNOSIS — I429 Cardiomyopathy, unspecified: Secondary | ICD-10-CM | POA: Diagnosis not present

## 2018-05-20 DIAGNOSIS — I10 Essential (primary) hypertension: Secondary | ICD-10-CM | POA: Diagnosis not present

## 2018-05-20 DIAGNOSIS — K117 Disturbances of salivary secretion: Secondary | ICD-10-CM | POA: Diagnosis not present

## 2018-05-20 DIAGNOSIS — E785 Hyperlipidemia, unspecified: Secondary | ICD-10-CM | POA: Diagnosis not present

## 2018-05-20 DIAGNOSIS — Z452 Encounter for adjustment and management of vascular access device: Secondary | ICD-10-CM | POA: Diagnosis not present

## 2018-05-20 DIAGNOSIS — S21101A Unspecified open wound of right front wall of thorax without penetration into thoracic cavity, initial encounter: Secondary | ICD-10-CM | POA: Diagnosis not present

## 2018-05-20 DIAGNOSIS — T8143XA Infection following a procedure, organ and space surgical site, initial encounter: Secondary | ICD-10-CM | POA: Diagnosis not present

## 2018-05-20 DIAGNOSIS — B9562 Methicillin resistant Staphylococcus aureus infection as the cause of diseases classified elsewhere: Secondary | ICD-10-CM | POA: Diagnosis not present

## 2018-05-20 DIAGNOSIS — I480 Paroxysmal atrial fibrillation: Secondary | ICD-10-CM | POA: Diagnosis not present

## 2018-05-20 DIAGNOSIS — Z4682 Encounter for fitting and adjustment of non-vascular catheter: Secondary | ICD-10-CM | POA: Diagnosis not present

## 2018-05-20 DIAGNOSIS — Z9889 Other specified postprocedural states: Secondary | ICD-10-CM | POA: Diagnosis not present

## 2018-05-20 DIAGNOSIS — Z951 Presence of aortocoronary bypass graft: Secondary | ICD-10-CM | POA: Diagnosis not present

## 2018-05-20 DIAGNOSIS — I5023 Acute on chronic systolic (congestive) heart failure: Secondary | ICD-10-CM | POA: Diagnosis not present

## 2018-05-20 DIAGNOSIS — J811 Chronic pulmonary edema: Secondary | ICD-10-CM | POA: Diagnosis not present

## 2018-05-20 DIAGNOSIS — I130 Hypertensive heart and chronic kidney disease with heart failure and stage 1 through stage 4 chronic kidney disease, or unspecified chronic kidney disease: Secondary | ICD-10-CM | POA: Diagnosis not present

## 2018-05-20 DIAGNOSIS — N189 Chronic kidney disease, unspecified: Secondary | ICD-10-CM | POA: Diagnosis not present

## 2018-05-20 DIAGNOSIS — T8149XA Infection following a procedure, other surgical site, initial encounter: Secondary | ICD-10-CM | POA: Diagnosis not present

## 2018-05-20 DIAGNOSIS — D62 Acute posthemorrhagic anemia: Secondary | ICD-10-CM | POA: Diagnosis not present

## 2018-05-20 DIAGNOSIS — Z954 Presence of other heart-valve replacement: Secondary | ICD-10-CM | POA: Diagnosis not present

## 2018-05-23 DIAGNOSIS — T8149XA Infection following a procedure, other surgical site, initial encounter: Secondary | ICD-10-CM | POA: Insufficient documentation

## 2018-05-29 DIAGNOSIS — K117 Disturbances of salivary secretion: Secondary | ICD-10-CM | POA: Insufficient documentation

## 2018-05-29 DIAGNOSIS — J90 Pleural effusion, not elsewhere classified: Secondary | ICD-10-CM | POA: Insufficient documentation

## 2018-05-29 DIAGNOSIS — Z7901 Long term (current) use of anticoagulants: Secondary | ICD-10-CM | POA: Insufficient documentation

## 2018-06-03 DIAGNOSIS — R6521 Severe sepsis with septic shock: Secondary | ICD-10-CM | POA: Diagnosis not present

## 2018-06-03 DIAGNOSIS — J9851 Mediastinitis: Secondary | ICD-10-CM | POA: Diagnosis not present

## 2018-06-03 DIAGNOSIS — I5022 Chronic systolic (congestive) heart failure: Secondary | ICD-10-CM | POA: Diagnosis not present

## 2018-06-03 MED ORDER — LIDOCAINE HCL 1 % IJ SOLN
.50 | INTRAMUSCULAR | Status: DC
Start: ? — End: 2018-06-03

## 2018-06-03 MED ORDER — LISINOPRIL 5 MG PO TABS
5.00 | ORAL_TABLET | ORAL | Status: DC
Start: 2018-06-04 — End: 2018-06-03

## 2018-06-03 MED ORDER — FLUCONAZOLE 100 MG PO TABS
100.00 | ORAL_TABLET | ORAL | Status: DC
Start: 2018-06-04 — End: 2018-06-03

## 2018-06-03 MED ORDER — ACETAMINOPHEN 325 MG PO TABS
650.00 | ORAL_TABLET | ORAL | Status: DC
Start: 2018-06-03 — End: 2018-06-03

## 2018-06-03 MED ORDER — ASPIRIN 81 MG PO CHEW
81.00 | CHEWABLE_TABLET | ORAL | Status: DC
Start: 2018-06-04 — End: 2018-06-03

## 2018-06-03 MED ORDER — GENERIC EXTERNAL MEDICATION
1.25 | Status: DC
Start: 2018-06-03 — End: 2018-06-03

## 2018-06-03 MED ORDER — TORSEMIDE 20 MG PO TABS
20.00 | ORAL_TABLET | ORAL | Status: DC
Start: 2018-06-04 — End: 2018-06-03

## 2018-06-03 MED ORDER — LIDOCAINE HCL 1 % IJ SOLN
3.00 | INTRAMUSCULAR | Status: DC
Start: ? — End: 2018-06-03

## 2018-06-03 MED ORDER — GENERIC EXTERNAL MEDICATION
Status: DC
Start: ? — End: 2018-06-03

## 2018-06-03 MED ORDER — METOPROLOL TARTRATE 25 MG PO TABS
25.00 | ORAL_TABLET | ORAL | Status: DC
Start: 2018-06-03 — End: 2018-06-03

## 2018-06-03 MED ORDER — DOLUTEGRAVIR SODIUM 50 MG PO TABS
50.00 | ORAL_TABLET | ORAL | Status: DC
Start: 2018-06-04 — End: 2018-06-03

## 2018-06-03 MED ORDER — ONDANSETRON HCL 4 MG/2ML IJ SOLN
4.00 | INTRAMUSCULAR | Status: DC
Start: ? — End: 2018-06-03

## 2018-06-03 MED ORDER — OXYCODONE HCL 5 MG PO TABS
5.00 | ORAL_TABLET | ORAL | Status: DC
Start: ? — End: 2018-06-03

## 2018-06-03 MED ORDER — ATORVASTATIN CALCIUM 20 MG PO TABS
20.00 | ORAL_TABLET | ORAL | Status: DC
Start: 2018-06-04 — End: 2018-06-03

## 2018-06-03 MED ORDER — EMTRICITABINE-TENOFOVIR AF 200-25 MG PO TABS
1.00 | ORAL_TABLET | ORAL | Status: DC
Start: 2018-06-04 — End: 2018-06-03

## 2018-06-03 MED ORDER — POTASSIUM CHLORIDE ER 20 MEQ PO TBCR
40.00 | EXTENDED_RELEASE_TABLET | ORAL | Status: DC
Start: 2018-06-04 — End: 2018-06-03

## 2018-06-03 MED ORDER — MAGNESIUM OXIDE 400 MG PO TABS
800.00 | ORAL_TABLET | ORAL | Status: DC
Start: 2018-06-04 — End: 2018-06-03

## 2018-06-03 MED ORDER — NYSTATIN 100000 UNIT/ML MT SUSP
5.00 | OROMUCOSAL | Status: DC
Start: 2018-06-03 — End: 2018-06-03

## 2018-06-03 MED ORDER — PANTOPRAZOLE SODIUM 40 MG PO TBEC
40.00 | DELAYED_RELEASE_TABLET | ORAL | Status: DC
Start: 2018-06-04 — End: 2018-06-03

## 2018-06-03 MED ORDER — SENNOSIDES-DOCUSATE SODIUM 8.6-50 MG PO TABS
2.00 | ORAL_TABLET | ORAL | Status: DC
Start: 2018-06-03 — End: 2018-06-03

## 2018-06-03 MED ORDER — TRAZODONE HCL 50 MG PO TABS
25.00 | ORAL_TABLET | ORAL | Status: DC
Start: 2018-06-03 — End: 2018-06-03

## 2018-06-03 MED ORDER — APIXABAN 5 MG PO TABS
5.00 | ORAL_TABLET | ORAL | Status: DC
Start: 2018-06-03 — End: 2018-06-03

## 2018-06-03 MED ORDER — POLYETHYLENE GLYCOL 3350 17 G PO PACK
17.00 | PACK | ORAL | Status: DC
Start: 2018-06-04 — End: 2018-06-03

## 2018-06-04 DIAGNOSIS — I340 Nonrheumatic mitral (valve) insufficiency: Secondary | ICD-10-CM | POA: Diagnosis not present

## 2018-06-04 DIAGNOSIS — T827XXA Infection and inflammatory reaction due to other cardiac and vascular devices, implants and grafts, initial encounter: Secondary | ICD-10-CM | POA: Diagnosis not present

## 2018-06-06 ENCOUNTER — Telehealth: Payer: Self-pay

## 2018-06-06 DIAGNOSIS — T827XXA Infection and inflammatory reaction due to other cardiac and vascular devices, implants and grafts, initial encounter: Secondary | ICD-10-CM | POA: Diagnosis not present

## 2018-06-06 DIAGNOSIS — A4101 Sepsis due to Methicillin susceptible Staphylococcus aureus: Secondary | ICD-10-CM | POA: Diagnosis not present

## 2018-06-06 DIAGNOSIS — I340 Nonrheumatic mitral (valve) insufficiency: Secondary | ICD-10-CM | POA: Diagnosis not present

## 2018-06-06 DIAGNOSIS — H43811 Vitreous degeneration, right eye: Secondary | ICD-10-CM | POA: Diagnosis not present

## 2018-06-06 DIAGNOSIS — A4102 Sepsis due to Methicillin resistant Staphylococcus aureus: Secondary | ICD-10-CM | POA: Diagnosis not present

## 2018-06-06 NOTE — Telephone Encounter (Signed)
Left message to check on pt to see how he is doing.

## 2018-06-06 NOTE — Telephone Encounter (Signed)
Left verbal orders on VM for [place]. Forwarding to [place] to [facility] F/U appt. Use any 30 same days if needed

## 2018-06-06 NOTE — Telephone Encounter (Signed)
Okay Will need a post hospital visit with me as well

## 2018-06-06 NOTE — Telephone Encounter (Signed)
Ria Comment nurse with [facility] request verbal orders for [facility] nursing 2 x a wk for 2 wks and 1 x a wk for 5 wks. Pt is at home with IV abx and is being followed by infectious disease also.

## 2018-06-07 NOTE — Telephone Encounter (Signed)
Patient called back and scheduled hospital f/u appointment on 06/16/18.

## 2018-06-07 NOTE — Telephone Encounter (Signed)
I left a message on patient's voice mail to call back and schedule a hospital f/u appt.  If patient returns my call, please schedule 30 minute appointment and can use same day, if needed.

## 2018-06-10 DIAGNOSIS — I340 Nonrheumatic mitral (valve) insufficiency: Secondary | ICD-10-CM | POA: Diagnosis not present

## 2018-06-10 DIAGNOSIS — R6521 Severe sepsis with septic shock: Secondary | ICD-10-CM | POA: Diagnosis not present

## 2018-06-10 DIAGNOSIS — T827XXA Infection and inflammatory reaction due to other cardiac and vascular devices, implants and grafts, initial encounter: Secondary | ICD-10-CM | POA: Diagnosis not present

## 2018-06-11 DIAGNOSIS — J9851 Mediastinitis: Secondary | ICD-10-CM | POA: Diagnosis not present

## 2018-06-11 DIAGNOSIS — I5022 Chronic systolic (congestive) heart failure: Secondary | ICD-10-CM | POA: Diagnosis not present

## 2018-06-11 DIAGNOSIS — R6521 Severe sepsis with septic shock: Secondary | ICD-10-CM | POA: Diagnosis not present

## 2018-06-12 DIAGNOSIS — I5022 Chronic systolic (congestive) heart failure: Secondary | ICD-10-CM | POA: Diagnosis not present

## 2018-06-12 DIAGNOSIS — J9851 Mediastinitis: Secondary | ICD-10-CM | POA: Diagnosis not present

## 2018-06-12 DIAGNOSIS — R6521 Severe sepsis with septic shock: Secondary | ICD-10-CM | POA: Diagnosis not present

## 2018-06-14 DIAGNOSIS — I340 Nonrheumatic mitral (valve) insufficiency: Secondary | ICD-10-CM | POA: Diagnosis not present

## 2018-06-14 DIAGNOSIS — B9562 Methicillin resistant Staphylococcus aureus infection as the cause of diseases classified elsewhere: Secondary | ICD-10-CM | POA: Diagnosis not present

## 2018-06-14 DIAGNOSIS — T827XXA Infection and inflammatory reaction due to other cardiac and vascular devices, implants and grafts, initial encounter: Secondary | ICD-10-CM | POA: Diagnosis not present

## 2018-06-16 ENCOUNTER — Ambulatory Visit (INDEPENDENT_AMBULATORY_CARE_PROVIDER_SITE_OTHER): Payer: Medicare Other | Admitting: Internal Medicine

## 2018-06-16 ENCOUNTER — Encounter: Payer: Self-pay | Admitting: Internal Medicine

## 2018-06-16 VITALS — BP 124/84 | HR 81 | Temp 98.3°F | Ht 71.0 in | Wt 171.0 lb

## 2018-06-16 DIAGNOSIS — M869 Osteomyelitis, unspecified: Secondary | ICD-10-CM

## 2018-06-16 DIAGNOSIS — I5022 Chronic systolic (congestive) heart failure: Secondary | ICD-10-CM | POA: Diagnosis not present

## 2018-06-16 NOTE — Assessment & Plan Note (Signed)
Compensated Weight down a lot Discussed changing the torsemide to prn if his weight goes up (he will check with cardiologist)

## 2018-06-16 NOTE — Assessment & Plan Note (Addendum)
On vanco 1 gm every 12 hours for MRSA Home health working with this Wounds are improving Has follow up at [facility]

## 2018-06-16 NOTE — Progress Notes (Signed)
Subjective:    Patient ID: Dale Mcdonald, male    DOB: 11/28/1950, 68 y.o.   MRN: [id number]  HPI Here for hospital follow up  Developed worsening of sternal wound ---some subcutaneous sutures apparently popped and there was oozing 2 additional surgeries for debridement of bone Sternal wires removed Then had muscle flap by plastic surgeon--- 1/7 Home 1/10 Home health---IV vanco, 6 weeks planned Not yet getting PT or OT Was being considered for cardiac rehab---but hadn't reached that point  No fever since home  Still has J-P drains in substernal Current Outpatient Medications on File Prior to Visit  Medication Sig Dispense Refill  . apixaban (ELIQUIS) 5 MG TABS tablet Take 1 tablet by mouth 2 (two) times daily.    Marland Kitchen aspirin EC 81 MG tablet Take 81 mg by mouth daily.    Marland Kitchen atorvastatin (LIPITOR) 20 MG tablet TAKE 1 TABLET BY MOUTH  DAILY 90 tablet 3  . dolutegravir (TIVICAY) 50 MG tablet TAKE 1 TABLET(50 MG) BY MOUTH DAILY 30 tablet 5  . emtricitabine-tenofovir AF (DESCOVY) 200-25 MG tablet Take 1 tablet by mouth daily. 30 tablet 5  . lisinopril (PRINIVIL,ZESTRIL) 5 MG tablet Take 1 tablet by mouth daily.    . Melatonin 5 MG TABS Take 5 mg by mouth at bedtime.     . metoprolol succinate (TOPROL-XL) 25 MG 24 hr tablet Take 1 tablet by mouth daily.    Marland Kitchen torsemide (DEMADEX) 20 MG tablet Take 1 tablet by mouth daily.     . traZODone (DESYREL) 50 MG tablet Take 0.5 tablets by mouth daily.    . valACYclovir (VALTREX) 500 MG tablet Take 1 tablet (500 mg total) by mouth daily. (Patient taking differently: Take 500 mg by mouth daily as needed (for fever blisters.). ) 30 tablet 11  . vancomycin 1,000 mg in sodium chloride 0.9 % 250 mL Inject into the vein.     No current facility-administered medications on file prior to visit.     No Known Allergies  Past Medical History:  Diagnosis Date  . Anxiety   . Anxiety 03/29/2017  . Heart murmur   . Hepatitis    B  . HIV (human  immunodeficiency virus infection) ([place])   . Hyperlipidemia   . Hypertension   . Palpitations     Past Surgical History:  Procedure Laterality Date  . CATARACT EXTRACTION W/PHACO Left 11/11/2017   Procedure: CATARACT EXTRACTION PHACO AND INTRAOCULAR LENS PLACEMENT (IOC);  Surgeon: Birder Robson, MD;  Location: [facility] ORS;  Service: Ophthalmology;  Laterality: Left;  [place] 00:59.3 AP% 19.5 CDE 11.54 Fluid pack lot # [id number]  . CATARACT EXTRACTION W/PHACO Right 12/21/2017   Procedure: CATARACT EXTRACTION PHACO AND INTRAOCULAR LENS PLACEMENT (IOC);  Surgeon: Birder Robson, MD;  Location: [facility] ORS;  Service: Ophthalmology;  Laterality: Right;  [place] 00:40 AP% 13.5 CDE 5.50 Fluid pack lot $ [id number]  . COLONOSCOPY    . COLONOSCOPY WITH PROPOFOL N/A 02/08/2018   Procedure: COLONOSCOPY WITH PROPOFOL;  Surgeon: Jonathon Bellows, MD;  Location: [facility] ENDOSCOPY;  Service: Gastroenterology;  Laterality: N/A;  . CORONARY ARTERY BYPASS GRAFT  04/07/2018  . EYE SURGERY     RETINA  . HERNIA REPAIR    . MITRAL VALVE REPLACEMENT  03/2018   bovine--with CABG    History reviewed. No pertinent family history.  Social History   Socioeconomic History  . Marital status: Widowed    Spouse name: Not on file  . Number of children: 1  . Years  of education: Not on file  . Highest education level: Not on file  Occupational History  . Occupation: VICE PRESIDENT--Purchasing---retired    Comment: [place]  . Financial resource strain: Not on file  . Food insecurity:    Worry: Not on file    Inability: Not on file  . Transportation needs:    Medical: Not on file    Non-medical: Not on file  Tobacco Use  . Smoking status: Former Smoker    Packs/day: 1.00    Types: Cigarettes  . Smokeless tobacco: Never Used  . Tobacco comment: gave 1-800-QUIT-NOW, currently on Chantix  Substance and Sexual Activity  . Alcohol use: No    Alcohol/week: 0.0 standard drinks  . Drug use:  No  . Sexual activity: Not on file  Lifestyle  . Physical activity:    Days per week: Not on file    Minutes per session: Not on file  . Stress: Not on file  Relationships  . Social connections:    Talks on phone: Not on file    Gets together: Not on file    Attends religious service: Not on file    Active member of club or organization: Not on file    Attends meetings of clubs or organizations: Not on file    Relationship status: Not on file  . Intimate partner violence:    Fear of current or ex partner: Not on file    Emotionally abused: Not on file    Physically abused: Not on file    Forced sexual activity: Not on file  Other Topics Concern  . Not on file  Social History Narrative   No living will   Would want son [name] to be decision maker   Would accept resuscitation   Not sure about tube feeds    Review of Systems Severe pain over the muscle flap with cough, sneeze, etc Today is the first day without oxycodone Appetite is diminished--but is improving Has lost a lot of weight--11# Bowels are okay Gets SOB if he overexerts (like having to walk all the way from parking garage at [facility]) Dry mouth has been terrible since on torsemide Weighs daily    Objective:   Physical Exam  Constitutional: He appears well-developed. No distress.  Neck: No thyromegaly present.  Cardiovascular: Normal rate, regular rhythm and normal heart sounds. Exam reveals no gallop.  No murmur heard. Respiratory: Effort normal. No respiratory distress. He has no wheezes.  Slight right basilar crackles Mild dullness at right base as well  GI: Soft. There is no abdominal tenderness.  Lymphadenopathy:    He has no cervical adenopathy.  Skin:  Sternal wound is clean and dry 2 J-P sites are not inflamed and no discharge (sub sternal)           Assessment & Plan:

## 2018-06-17 DIAGNOSIS — J9851 Mediastinitis: Secondary | ICD-10-CM | POA: Diagnosis not present

## 2018-06-17 DIAGNOSIS — R6521 Severe sepsis with septic shock: Secondary | ICD-10-CM | POA: Diagnosis not present

## 2018-06-17 DIAGNOSIS — I340 Nonrheumatic mitral (valve) insufficiency: Secondary | ICD-10-CM | POA: Diagnosis not present

## 2018-06-17 DIAGNOSIS — T827XXA Infection and inflammatory reaction due to other cardiac and vascular devices, implants and grafts, initial encounter: Secondary | ICD-10-CM | POA: Diagnosis not present

## 2018-06-17 DIAGNOSIS — I5022 Chronic systolic (congestive) heart failure: Secondary | ICD-10-CM | POA: Diagnosis not present

## 2018-06-20 DIAGNOSIS — Z951 Presence of aortocoronary bypass graft: Secondary | ICD-10-CM | POA: Diagnosis not present

## 2018-06-20 DIAGNOSIS — R9431 Abnormal electrocardiogram [ECG] [EKG]: Secondary | ICD-10-CM | POA: Diagnosis not present

## 2018-06-20 DIAGNOSIS — I517 Cardiomegaly: Secondary | ICD-10-CM | POA: Diagnosis not present

## 2018-06-20 DIAGNOSIS — R5383 Other fatigue: Secondary | ICD-10-CM | POA: Diagnosis not present

## 2018-06-20 DIAGNOSIS — I340 Nonrheumatic mitral (valve) insufficiency: Secondary | ICD-10-CM | POA: Diagnosis not present

## 2018-06-20 DIAGNOSIS — B9562 Methicillin resistant Staphylococcus aureus infection as the cause of diseases classified elsewhere: Secondary | ICD-10-CM | POA: Diagnosis not present

## 2018-06-20 DIAGNOSIS — J90 Pleural effusion, not elsewhere classified: Secondary | ICD-10-CM | POA: Diagnosis not present

## 2018-06-20 DIAGNOSIS — Z952 Presence of prosthetic heart valve: Secondary | ICD-10-CM | POA: Diagnosis not present

## 2018-06-20 DIAGNOSIS — I4891 Unspecified atrial fibrillation: Secondary | ICD-10-CM | POA: Diagnosis not present

## 2018-06-20 DIAGNOSIS — T827XXA Infection and inflammatory reaction due to other cardiac and vascular devices, implants and grafts, initial encounter: Secondary | ICD-10-CM | POA: Diagnosis not present

## 2018-06-20 DIAGNOSIS — T8149XD Infection following a procedure, other surgical site, subsequent encounter: Secondary | ICD-10-CM | POA: Diagnosis not present

## 2018-06-20 DIAGNOSIS — Z452 Encounter for adjustment and management of vascular access device: Secondary | ICD-10-CM | POA: Diagnosis not present

## 2018-06-20 DIAGNOSIS — Z7901 Long term (current) use of anticoagulants: Secondary | ICD-10-CM | POA: Diagnosis not present

## 2018-06-22 DIAGNOSIS — Z951 Presence of aortocoronary bypass graft: Secondary | ICD-10-CM | POA: Diagnosis not present

## 2018-06-22 DIAGNOSIS — D72819 Decreased white blood cell count, unspecified: Secondary | ICD-10-CM | POA: Diagnosis not present

## 2018-06-22 DIAGNOSIS — I340 Nonrheumatic mitral (valve) insufficiency: Secondary | ICD-10-CM | POA: Diagnosis not present

## 2018-06-22 DIAGNOSIS — I5022 Chronic systolic (congestive) heart failure: Secondary | ICD-10-CM | POA: Diagnosis not present

## 2018-06-22 DIAGNOSIS — I2510 Atherosclerotic heart disease of native coronary artery without angina pectoris: Secondary | ICD-10-CM | POA: Diagnosis not present

## 2018-06-23 ENCOUNTER — Telehealth: Payer: Self-pay

## 2018-06-23 DIAGNOSIS — Z792 Long term (current) use of antibiotics: Secondary | ICD-10-CM | POA: Diagnosis not present

## 2018-06-23 DIAGNOSIS — Z4889 Encounter for other specified surgical aftercare: Secondary | ICD-10-CM | POA: Diagnosis not present

## 2018-06-23 NOTE — Telephone Encounter (Signed)
Patient is currently admitted at [facility] . Dale Mcdonald would like to speak with

## 2018-06-23 NOTE — Telephone Encounter (Signed)
Patient is currently admitted at [facility], NP would like to speak with Dr Tommy Medal regarding patient.   Phone : [phone]

## 2018-06-24 DIAGNOSIS — B9562 Methicillin resistant Staphylococcus aureus infection as the cause of diseases classified elsewhere: Secondary | ICD-10-CM | POA: Diagnosis not present

## 2018-06-24 DIAGNOSIS — I5022 Chronic systolic (congestive) heart failure: Secondary | ICD-10-CM | POA: Diagnosis not present

## 2018-06-24 DIAGNOSIS — T827XXA Infection and inflammatory reaction due to other cardiac and vascular devices, implants and grafts, initial encounter: Secondary | ICD-10-CM | POA: Diagnosis not present

## 2018-06-24 DIAGNOSIS — I340 Nonrheumatic mitral (valve) insufficiency: Secondary | ICD-10-CM | POA: Diagnosis not present

## 2018-06-24 DIAGNOSIS — R6521 Severe sepsis with septic shock: Secondary | ICD-10-CM | POA: Diagnosis not present

## 2018-06-24 DIAGNOSIS — J9851 Mediastinitis: Secondary | ICD-10-CM | POA: Diagnosis not present

## 2018-06-27 DIAGNOSIS — B9562 Methicillin resistant Staphylococcus aureus infection as the cause of diseases classified elsewhere: Secondary | ICD-10-CM | POA: Diagnosis not present

## 2018-06-27 DIAGNOSIS — T827XXA Infection and inflammatory reaction due to other cardiac and vascular devices, implants and grafts, initial encounter: Secondary | ICD-10-CM | POA: Diagnosis not present

## 2018-06-27 DIAGNOSIS — I340 Nonrheumatic mitral (valve) insufficiency: Secondary | ICD-10-CM | POA: Diagnosis not present

## 2018-06-27 NOTE — Telephone Encounter (Signed)
Patient is on Vancomycin for MRSA mediastinitis at [facility] but wants me to take over monitoring IV abx  I asked MD to have labs faxed here attn Cassie  He should schedule FU with me or NP in next 2 weeks  [place] will continue to follow as well

## 2018-06-30 ENCOUNTER — Telehealth: Payer: Self-pay | Admitting: Pharmacist

## 2018-06-30 NOTE — Telephone Encounter (Signed)
Corene Cornea, [place] Pharmacist at [facility], reached out to me in regards to patient's IV vancomycin.  He will continue following until patient follows up with Marya Amsler on 2/18.  Patient is tentatively scheduled to stop OPAT on 2/18, so if that changes, please let me know and I will update/coordinate with Corene Cornea. I have access to patient's labs on [facility] site.   Of note, patients wbc is slightly elevated at 13.2. Patient was leukopenic and received Neupogen a few days ago. Vanc trough is therapeutic at 16.2.

## 2018-06-30 NOTE — Telephone Encounter (Signed)
Sounds great Kasie strains with her doing with the Neupogen and keeping him on vancomycin but will see how he does.  Marya Amsler thanks for seeing him on the 18th

## 2018-07-02 DIAGNOSIS — R6521 Severe sepsis with septic shock: Secondary | ICD-10-CM | POA: Diagnosis not present

## 2018-07-02 DIAGNOSIS — J9851 Mediastinitis: Secondary | ICD-10-CM | POA: Diagnosis not present

## 2018-07-02 DIAGNOSIS — I5022 Chronic systolic (congestive) heart failure: Secondary | ICD-10-CM | POA: Diagnosis not present

## 2018-07-04 DIAGNOSIS — H3322 Serous retinal detachment, left eye: Secondary | ICD-10-CM | POA: Diagnosis not present

## 2018-07-04 DIAGNOSIS — T827XXA Infection and inflammatory reaction due to other cardiac and vascular devices, implants and grafts, initial encounter: Secondary | ICD-10-CM | POA: Diagnosis not present

## 2018-07-04 DIAGNOSIS — I340 Nonrheumatic mitral (valve) insufficiency: Secondary | ICD-10-CM | POA: Diagnosis not present

## 2018-07-05 DIAGNOSIS — Z7982 Long term (current) use of aspirin: Secondary | ICD-10-CM | POA: Diagnosis not present

## 2018-07-05 DIAGNOSIS — N189 Chronic kidney disease, unspecified: Secondary | ICD-10-CM | POA: Diagnosis not present

## 2018-07-05 DIAGNOSIS — I2510 Atherosclerotic heart disease of native coronary artery without angina pectoris: Secondary | ICD-10-CM | POA: Diagnosis not present

## 2018-07-05 DIAGNOSIS — I130 Hypertensive heart and chronic kidney disease with heart failure and stage 1 through stage 4 chronic kidney disease, or unspecified chronic kidney disease: Secondary | ICD-10-CM | POA: Diagnosis not present

## 2018-07-05 DIAGNOSIS — J9851 Mediastinitis: Secondary | ICD-10-CM | POA: Diagnosis not present

## 2018-07-05 DIAGNOSIS — E782 Mixed hyperlipidemia: Secondary | ICD-10-CM | POA: Diagnosis not present

## 2018-07-05 DIAGNOSIS — B9562 Methicillin resistant Staphylococcus aureus infection as the cause of diseases classified elsewhere: Secondary | ICD-10-CM | POA: Diagnosis not present

## 2018-07-05 DIAGNOSIS — Z87891 Personal history of nicotine dependence: Secondary | ICD-10-CM | POA: Diagnosis not present

## 2018-07-05 DIAGNOSIS — Z452 Encounter for adjustment and management of vascular access device: Secondary | ICD-10-CM | POA: Diagnosis not present

## 2018-07-05 DIAGNOSIS — Z5181 Encounter for therapeutic drug level monitoring: Secondary | ICD-10-CM | POA: Diagnosis not present

## 2018-07-05 DIAGNOSIS — Z7901 Long term (current) use of anticoagulants: Secondary | ICD-10-CM | POA: Diagnosis not present

## 2018-07-05 DIAGNOSIS — Z953 Presence of xenogenic heart valve: Secondary | ICD-10-CM | POA: Diagnosis not present

## 2018-07-05 DIAGNOSIS — I4819 Other persistent atrial fibrillation: Secondary | ICD-10-CM | POA: Diagnosis not present

## 2018-07-05 DIAGNOSIS — I340 Nonrheumatic mitral (valve) insufficiency: Secondary | ICD-10-CM | POA: Diagnosis not present

## 2018-07-05 DIAGNOSIS — Z951 Presence of aortocoronary bypass graft: Secondary | ICD-10-CM | POA: Diagnosis not present

## 2018-07-05 DIAGNOSIS — I5042 Chronic combined systolic (congestive) and diastolic (congestive) heart failure: Secondary | ICD-10-CM | POA: Diagnosis not present

## 2018-07-05 DIAGNOSIS — T827XXA Infection and inflammatory reaction due to other cardiac and vascular devices, implants and grafts, initial encounter: Secondary | ICD-10-CM | POA: Diagnosis not present

## 2018-07-10 DIAGNOSIS — J9851 Mediastinitis: Secondary | ICD-10-CM | POA: Diagnosis not present

## 2018-07-10 DIAGNOSIS — I5022 Chronic systolic (congestive) heart failure: Secondary | ICD-10-CM | POA: Diagnosis not present

## 2018-07-10 DIAGNOSIS — R6521 Severe sepsis with septic shock: Secondary | ICD-10-CM | POA: Diagnosis not present

## 2018-07-11 DIAGNOSIS — I340 Nonrheumatic mitral (valve) insufficiency: Secondary | ICD-10-CM | POA: Diagnosis not present

## 2018-07-11 DIAGNOSIS — T827XXA Infection and inflammatory reaction due to other cardiac and vascular devices, implants and grafts, initial encounter: Secondary | ICD-10-CM | POA: Diagnosis not present

## 2018-07-11 DIAGNOSIS — B9562 Methicillin resistant Staphylococcus aureus infection as the cause of diseases classified elsewhere: Secondary | ICD-10-CM | POA: Diagnosis not present

## 2018-07-12 ENCOUNTER — Encounter: Payer: Self-pay | Admitting: Family

## 2018-07-12 ENCOUNTER — Ambulatory Visit: Payer: Medicare Other | Admitting: Family

## 2018-07-12 VITALS — Ht 71.0 in | Wt 177.0 lb

## 2018-07-12 DIAGNOSIS — M869 Osteomyelitis, unspecified: Secondary | ICD-10-CM

## 2018-07-12 DIAGNOSIS — B9562 Methicillin resistant Staphylococcus aureus infection as the cause of diseases classified elsewhere: Secondary | ICD-10-CM

## 2018-07-12 DIAGNOSIS — J9851 Mediastinitis: Secondary | ICD-10-CM | POA: Insufficient documentation

## 2018-07-12 DIAGNOSIS — R7881 Bacteremia: Secondary | ICD-10-CM | POA: Diagnosis not present

## 2018-07-12 MED ORDER — DOXYCYCLINE HYCLATE 100 MG PO TABS: 100.0000 mg | ORAL_TABLET | Freq: Two times a day (BID) | ORAL | 2 refills | Status: DC

## 2018-07-12 NOTE — Progress Notes (Signed)
LPN called [place] and gave verbal orders to [facility] per Terri Piedra, NP to pull picc Today or first available date. Patient to transition to oral antibiotics starting today.  VORB and understood.  Patient made aware during office visit today.

## 2018-07-12 NOTE — Patient Instructions (Signed)
Nice to see you.  We will get your PICC line removed.   Start taking doxycycline this evening.  We will plan to follow up in 1 month or sooner if needed.

## 2018-07-12 NOTE — Assessment & Plan Note (Signed)
Cultures cleared on 05/25/18 with most likely source being his post-surgical sternal wound. There is concern given recent bovine mitral valve replacement and will continue antibiotic therapy for now.

## 2018-07-12 NOTE — Assessment & Plan Note (Signed)
Dale Mcdonald is s/p 3 vessel CABG and bovine mitral valve replacement complicated by MRSA bacteremia and osteomyelitis of the sternum requiring multiple irrigation and debridement procedures and finalized with a flap procedure. At the completion of 6 weeks of IV therapy with vancomcyin with inflammatory markers remaining elevated (ESR 54, CRP 16). Wound is healing without overt signs of infection. Will change IV vancomycin to oral doxycycline. He will continue to require a prolonged course of antibiotics as I have concern with his recent valve replacement. Plan for follow up in 1 month or sooner if needed.

## 2018-07-12 NOTE — Progress Notes (Signed)
Subjective:    Patient ID: Dale Mcdonald, male    DOB: [birth year], 68 y.o.   MRN: [id number]  Chief Complaint  Patient presents with  . MRSA Bacteremia / Sternal Osteomyelitis     HPI:  Dale Mcdonald is a 68 y.o. male with previous medical history of HIV, chronic Hepatitis B, tobacco use and chronic systolic heart failure with mitral regurgitation s/p 3 vessel CABG and bovine mitral valve replacement (November 2019) who presents today for hospitalization follow up.   Dale Mcdonald was hospitalized at [facility] on 12/29 with mediastinitis and shock. Was seen on previous Friday by Cardiology with drainage from incision and started on Bactrim however continued to have pain and drainage from the sternum with fevers up to 101. CT scan confirming mediastinitis. He was taken to the OR on multiple occasions for irrigation and debridement with the final surgery being performed being a flap procedure. Wound and blood cultures were positive for MRSA and was planned for 6 weeks of IV therapy with vancomycin from 1/7 until 07/12/18. Blood cultures were sterile on 05/25/2018.   Course has been complicated by neturopenia which resolved with medication. He did also have increasing edema near the top of his incision on 1/30 during follow up which has since resolved and related to stiches that were left in place. Most recent inflammatory markers with ESR of 56 and CRP of 18. All hospital notes, labs and imaging were reviewed in detail.  Dale Mcdonald has been receiving his vancomycin as prescribed with the adverse side effect of fatigue. Denies any fevers, chills, sweats, drainage from wounds, or problems with PICC line. He has not been able to start cardiac rehabilitation due to his PICC line.   Allergies  Allergen Reactions  . Magnesium Nausea Only      Outpatient Medications Prior to Visit  Medication Sig Dispense Refill  . apixaban (ELIQUIS) 5 MG TABS tablet Take 1 tablet by mouth 2 (two)  times daily.    Marland Kitchen aspirin EC 81 MG tablet Take 81 mg by mouth daily.    Marland Kitchen atorvastatin (LIPITOR) 20 MG tablet TAKE 1 TABLET BY MOUTH  DAILY 90 tablet 3  . dolutegravir (TIVICAY) 50 MG tablet TAKE 1 TABLET(50 MG) BY MOUTH DAILY 30 tablet 5  . emtricitabine-tenofovir AF (DESCOVY) 200-25 MG tablet Take 1 tablet by mouth daily. 30 tablet 5  . lisinopril (PRINIVIL,ZESTRIL) 5 MG tablet Take 1 tablet by mouth daily.    . Melatonin 5 MG TABS Take 5 mg by mouth at bedtime.     . metoprolol succinate (TOPROL-XL) 25 MG 24 hr tablet Take 1 tablet by mouth daily.    Marland Kitchen torsemide (DEMADEX) 20 MG tablet Take 1 tablet by mouth daily.     . traZODone (DESYREL) 50 MG tablet Take 0.5 tablets by mouth daily.    . valACYclovir (VALTREX) 500 MG tablet Take 1 tablet (500 mg total) by mouth daily. (Patient taking differently: Take 500 mg by mouth daily as needed (for fever blisters.). ) 30 tablet 11  . vancomycin 1,000 mg in sodium chloride 0.9 % 250 mL Inject into the vein.     No facility-administered medications prior to visit.      Past Medical History:  Diagnosis Date  . Anxiety   . Anxiety 03/29/2017  . Heart murmur   . Hepatitis    B  . HIV (human immunodeficiency virus infection) ([place])   . Hyperlipidemia   . Hypertension   . Palpitations  Past Surgical History:  Procedure Laterality Date  . CATARACT EXTRACTION W/PHACO Left 11/11/2017   Procedure: CATARACT EXTRACTION PHACO AND INTRAOCULAR LENS PLACEMENT (IOC);  Surgeon: Birder Robson, MD;  Location: [facility] ORS;  Service: Ophthalmology;  Laterality: Left;  [place] 00:59.3 AP% 19.5 CDE 11.54 Fluid pack lot # [id number]  . CATARACT EXTRACTION W/PHACO Right 12/21/2017   Procedure: CATARACT EXTRACTION PHACO AND INTRAOCULAR LENS PLACEMENT (IOC);  Surgeon: Birder Robson, MD;  Location: [facility] ORS;  Service: Ophthalmology;  Laterality: Right;  [place] 00:40 AP% 13.5 CDE 5.50 Fluid pack lot $ [id number]  . COLONOSCOPY    . COLONOSCOPY WITH PROPOFOL N/A  02/08/2018   Procedure: COLONOSCOPY WITH PROPOFOL;  Surgeon: Jonathon Bellows, MD;  [facility] ENDOSCOPY;  Service: Gastroenterology;  Laterality: N/A;  . CORONARY ARTERY BYPASS GRAFT  04/07/2018  . EYE SURGERY     RETINA  . HERNIA REPAIR    . MITRAL VALVE REPLACEMENT  03/2018   bovine--with CABG    Review of Systems  Constitutional: Positive for fatigue. Negative for chills and fever.  Respiratory: Negative for chest tightness, shortness of breath and wheezing.   Cardiovascular: Negative for chest pain and palpitations.  Gastrointestinal: Negative for constipation, diarrhea, nausea and vomiting.  Skin: Positive for wound.      Objective:    Ht '5\' 11"'$  (1.803 m)   Wt 177 lb (80.3 kg)   BMI 24.69 kg/m  Nursing note and vital signs reviewed.  Physical Exam Constitutional:      General: He is not in acute distress.    Appearance: He is well-developed.  Cardiovascular:     Rate and Rhythm: Normal rate and regular rhythm.     Pulses: Normal pulses.     Heart sounds: Normal heart sounds.     Comments: Valve click present Pulmonary:     Effort: Pulmonary effort is normal.     Breath sounds: Normal breath sounds.     Comments: Sternal surgical incision appears well approximated with good scar formation. There is no redness or induration. Sensation remains decreased.  Skin:    General: Skin is warm and dry.  Neurological:     Mental Status: He is alert and oriented to person, place, and time.  Psychiatric:        Behavior: Behavior normal.        Thought Content: Thought content normal.        Judgment: Judgment normal.        Assessment & Plan:   Problem List Items Addressed This Visit      Musculoskeletal and Integument   Osteomyelitis of sternum [facility])    Dale Mcdonald is s/p 3 vessel CABG and bovine mitral valve replacement complicated by MRSA bacteremia and osteomyelitis of the sternum requiring multiple irrigation and debridement procedures and finalized with a flap  procedure. At the completion of 6 weeks of IV therapy with vancomcyin with inflammatory markers remaining elevated (ESR 54, CRP 16). Wound is healing without overt signs of infection. Will change IV vancomycin to oral doxycycline. He will continue to require a prolonged course of antibiotics as I have concern with his recent valve replacement. Plan for follow up in 1 month or sooner if needed.         Other   MRSA bacteremia - Primary    Cultures cleared on 05/25/18 with most likely source being his post-surgical sternal wound. There is concern given recent bovine mitral valve replacement and will continue antibiotic therapy for now.  Relevant Medications   doxycycline (VIBRA-TABS) 100 MG tablet       I have discontinued Mardy R. Duclos's (vancomycin 1,000 mg in sodium chloride 0.9 % 250 mL). I am also having him start on doxycycline. Additionally, I am having him maintain his aspirin EC, valACYclovir, Melatonin, emtricitabine-tenofovir AF, dolutegravir, atorvastatin, apixaban, metoprolol succinate, torsemide, traZODone, and lisinopril.   Meds ordered this encounter  Medications  . doxycycline (VIBRA-TABS) 100 MG tablet    Sig: Take 1 tablet (100 mg total) by mouth 2 (two) times daily.    Dispense:  60 tablet    Refill:  2    Order Specific Question:   Supervising Provider    Answer:   Carlyle Basques [[id number]]     Follow-up: Return in about 1 month (around 08/10/2018), or if symptoms worsen or fail to improve.   Terri Piedra, MSN, FNP-C Nurse Practitioner [facility] [place] [facility] Office phone: [phone] Pager: [place] number: [phone]

## 2018-07-13 DIAGNOSIS — I130 Hypertensive heart and chronic kidney disease with heart failure and stage 1 through stage 4 chronic kidney disease, or unspecified chronic kidney disease: Secondary | ICD-10-CM | POA: Diagnosis not present

## 2018-07-13 DIAGNOSIS — E782 Mixed hyperlipidemia: Secondary | ICD-10-CM | POA: Diagnosis not present

## 2018-07-13 DIAGNOSIS — N189 Chronic kidney disease, unspecified: Secondary | ICD-10-CM | POA: Diagnosis not present

## 2018-07-13 DIAGNOSIS — Z7982 Long term (current) use of aspirin: Secondary | ICD-10-CM | POA: Diagnosis not present

## 2018-07-13 DIAGNOSIS — Z953 Presence of xenogenic heart valve: Secondary | ICD-10-CM | POA: Diagnosis not present

## 2018-07-13 DIAGNOSIS — J9851 Mediastinitis: Secondary | ICD-10-CM | POA: Diagnosis not present

## 2018-07-13 DIAGNOSIS — Z5181 Encounter for therapeutic drug level monitoring: Secondary | ICD-10-CM | POA: Diagnosis not present

## 2018-07-13 DIAGNOSIS — I4819 Other persistent atrial fibrillation: Secondary | ICD-10-CM | POA: Diagnosis not present

## 2018-07-13 DIAGNOSIS — Z452 Encounter for adjustment and management of vascular access device: Secondary | ICD-10-CM | POA: Diagnosis not present

## 2018-07-13 DIAGNOSIS — I2510 Atherosclerotic heart disease of native coronary artery without angina pectoris: Secondary | ICD-10-CM | POA: Diagnosis not present

## 2018-07-13 DIAGNOSIS — I5042 Chronic combined systolic (congestive) and diastolic (congestive) heart failure: Secondary | ICD-10-CM | POA: Diagnosis not present

## 2018-07-13 DIAGNOSIS — Z7901 Long term (current) use of anticoagulants: Secondary | ICD-10-CM | POA: Diagnosis not present

## 2018-07-13 DIAGNOSIS — Z87891 Personal history of nicotine dependence: Secondary | ICD-10-CM | POA: Diagnosis not present

## 2018-07-13 DIAGNOSIS — B9562 Methicillin resistant Staphylococcus aureus infection as the cause of diseases classified elsewhere: Secondary | ICD-10-CM | POA: Diagnosis not present

## 2018-07-13 DIAGNOSIS — Z951 Presence of aortocoronary bypass graft: Secondary | ICD-10-CM | POA: Diagnosis not present

## 2018-07-13 DIAGNOSIS — I340 Nonrheumatic mitral (valve) insufficiency: Secondary | ICD-10-CM | POA: Diagnosis not present

## 2018-07-13 DIAGNOSIS — T827XXA Infection and inflammatory reaction due to other cardiac and vascular devices, implants and grafts, initial encounter: Secondary | ICD-10-CM | POA: Diagnosis not present

## 2018-07-15 MED ORDER — SULFAMETHOXAZOLE-TRIMETHOPRIM 800-160 MG PO TABS: 1.0000 | ORAL_TABLET | Freq: Two times a day (BID) | ORAL | 2 refills | Status: DC

## 2018-07-28 ENCOUNTER — Encounter: Payer: Medicare Other | Attending: Internal Medicine | Admitting: *Deleted

## 2018-07-28 ENCOUNTER — Encounter: Payer: Self-pay | Admitting: *Deleted

## 2018-07-28 VITALS — Ht 71.0 in | Wt 172.5 lb

## 2018-07-28 DIAGNOSIS — Z87891 Personal history of nicotine dependence: Secondary | ICD-10-CM | POA: Insufficient documentation

## 2018-07-28 DIAGNOSIS — Z7901 Long term (current) use of anticoagulants: Secondary | ICD-10-CM | POA: Diagnosis not present

## 2018-07-28 DIAGNOSIS — R002 Palpitations: Secondary | ICD-10-CM | POA: Diagnosis not present

## 2018-07-28 DIAGNOSIS — Z951 Presence of aortocoronary bypass graft: Secondary | ICD-10-CM

## 2018-07-28 DIAGNOSIS — Z7982 Long term (current) use of aspirin: Secondary | ICD-10-CM | POA: Insufficient documentation

## 2018-07-28 DIAGNOSIS — R011 Cardiac murmur, unspecified: Secondary | ICD-10-CM | POA: Insufficient documentation

## 2018-07-28 DIAGNOSIS — I10 Essential (primary) hypertension: Secondary | ICD-10-CM | POA: Insufficient documentation

## 2018-07-28 DIAGNOSIS — E785 Hyperlipidemia, unspecified: Secondary | ICD-10-CM | POA: Diagnosis not present

## 2018-07-28 DIAGNOSIS — Z79899 Other long term (current) drug therapy: Secondary | ICD-10-CM | POA: Insufficient documentation

## 2018-07-28 DIAGNOSIS — B20 Human immunodeficiency virus [HIV] disease: Secondary | ICD-10-CM | POA: Diagnosis not present

## 2018-07-28 DIAGNOSIS — Z952 Presence of prosthetic heart valve: Secondary | ICD-10-CM | POA: Diagnosis not present

## 2018-07-28 NOTE — Patient Instructions (Signed)
Patient Instructions  Patient Details  Name: Dale Mcdonald MRN: [id number] Date of Birth: [birth year] Referring Provider:  [name], MD  Below are your personal goals for exercise, nutrition, and risk factors. Our goal is to help you stay on track towards obtaining and maintaining these goals. We will be discussing your progress on these goals with you throughout the program.  Initial Exercise Prescription: Initial Exercise Prescription - 07/28/18 1400      Date of Initial Exercise RX and Referring Provider   Date  07/28/18    Referring Provider  [place]      Treadmill   MPH  2.3    Grade  1    Minutes  15    METs  3      Recumbant Bike   Level  2    RPM  60    Watts  25    Minutes  15    METs  3      NuStep   Level  3    SPM  80    Minutes  15    METs  3      Prescription Details   Frequency (times per week)  3    Duration  Progress to 45 minutes of aerobic exercise without signs/symptoms of physical distress      Intensity   THRR 40-80% of Max Heartrate  109-138    Ratings of Perceived Exertion  11-13    Perceived Dyspnea  0-4      Resistance Training   Training Prescription  Yes    Weight  3 lb    Reps  10-15       Exercise Goals: Frequency: Be able to perform aerobic exercise two to three times per week in program working toward 2-5 days per week of home exercise.  Intensity: Work with a perceived exertion of 11 (fairly light) - 15 (hard) while following your exercise prescription.  We will make changes to your prescription with you as you progress through the program.   Duration: Be able to do 30 to 45 minutes of continuous aerobic exercise in addition to a 5 minute warm-up and a 5 minute cool-down routine.   Nutrition Goals: Your personal nutrition goals will be established when you do your nutrition analysis with the dietician.  The following are general nutrition guidelines to follow: Cholesterol < 200mg /day Sodium < 1500mg /day Fiber: Men  over 50 yrs - 30 grams per day  Personal Goals: Personal Goals and Risk Factors at Admission - 07/28/18 1414      Core Components/Risk Factors/Patient Goals on Admission    Weight Management  Yes;Weight Maintenance    Intervention  Weight Management: Develop a combined nutrition and exercise program designed to reach desired caloric intake, while maintaining appropriate intake of nutrient and fiber, sodium and fats, and appropriate energy expenditure required for the weight goal.;Weight Management: Provide education and appropriate resources to help participant work on and attain dietary goals.   Lost 15 pounds since surgery. Wants to keep weight where he is now  172.5 lbs   Expected Outcomes  Short Term: Continue to assess and modify interventions until short term weight is achieved;Long Term: Adherence to nutrition and physical activity/exercise program aimed toward attainment of established weight goal    Improve shortness of breath with ADL's  Yes    Intervention  Provide education, individualized exercise plan and daily activity instruction to help decrease symptoms of SOB with activities of daily living.  Expected Outcomes  Short Term: Improve cardiorespiratory fitness to achieve a reduction of symptoms when performing ADLs;Long Term: Be able to perform more ADLs without symptoms or delay the onset of symptoms    Hypertension  Yes    Intervention  Provide education on lifestyle modifcations including regular physical activity/exercise, weight management, moderate sodium restriction and increased consumption of fresh fruit, vegetables, and low fat dairy, alcohol moderation, and smoking cessation.;Monitor prescription use compliance.    Expected Outcomes  Short Term: Continued assessment and intervention until BP is < 140/57mm HG in hypertensive participants. < 130/51mm HG in hypertensive participants with diabetes, heart failure or chronic kidney disease.;Long Term: Maintenance of blood  pressure at goal levels.    Lipids  Yes    Intervention  Provide education and support for participant on nutrition & aerobic/resistive exercise along with prescribed medications to achieve LDL 70mg , HDL >40mg .    Expected Outcomes  Short Term: Participant states understanding of desired cholesterol values and is compliant with medications prescribed. Participant is following exercise prescription and nutrition guidelines.;Long Term: Cholesterol controlled with medications as prescribed, with individualized exercise RX and with personalized nutrition plan. Value goals: LDL < 70mg , HDL > 40 mg.       Tobacco Use Initial Evaluation: Social History   Tobacco Use  Smoking Status Former Smoker  . Packs/day: 2.00  . Years: 40.00  . Pack years: 80.00  . Types: Cigarettes  Smokeless Tobacco Never Used  Tobacco Comment   Quit 05/25/2017    Exercise Goals and Review: Exercise Goals    Row Name 07/28/18 1441             Exercise Goals   Increase Physical Activity  Yes       Intervention  Provide advice, education, support and counseling about physical activity/exercise needs.;Develop an individualized exercise prescription for aerobic and resistive training based on initial evaluation findings, risk stratification, comorbidities and participant's personal goals.       Expected Outcomes  Short Term: Attend rehab on a regular basis to increase amount of physical activity.;Long Term: Add in home exercise to make exercise part of routine and to increase amount of physical activity.;Long Term: Exercising regularly at least 3-5 days a week.       Increase Strength and Stamina  Yes       Intervention  Provide advice, education, support and counseling about physical activity/exercise needs.;Develop an individualized exercise prescription for aerobic and resistive training based on initial evaluation findings, risk stratification, comorbidities and participant's personal goals.       Expected Outcomes   Short Term: Increase workloads from initial exercise prescription for resistance, speed, and METs.;Short Term: Perform resistance training exercises routinely during rehab and add in resistance training at home;Long Term: Improve cardiorespiratory fitness, muscular endurance and strength as measured by increased METs and functional capacity (6MWT)       Able to understand and use rate of perceived exertion (RPE) scale  Yes       Intervention  Provide education and explanation on how to use RPE scale       Expected Outcomes  Short Term: Able to use RPE daily in rehab to express subjective intensity level;Long Term:  Able to use RPE to guide intensity level when exercising independently       Able to understand and use Dyspnea scale  Yes       Intervention  Provide education and explanation on how to use Dyspnea scale  Expected Outcomes  Short Term: Able to use Dyspnea scale daily in rehab to express subjective sense of shortness of breath during exertion;Long Term: Able to use Dyspnea scale to guide intensity level when exercising independently       Knowledge and understanding of Target Heart Rate Range (THRR)  Yes       Intervention  Provide education and explanation of THRR including how the numbers were predicted and where they are located for reference       Expected Outcomes  Short Term: Able to state/look up THRR;Short Term: Able to use daily as guideline for intensity in rehab;Long Term: Able to use THRR to govern intensity when exercising independently       Able to check pulse independently  Yes       Intervention  Provide education and demonstration on how to check pulse in carotid and radial arteries.;Review the importance of being able to check your own pulse for safety during independent exercise       Expected Outcomes  Short Term: Able to explain why pulse checking is important during independent exercise;Long Term: Able to check pulse independently and accurately        Understanding of Exercise Prescription  Yes       Intervention  Provide education, explanation, and written materials on patient's individual exercise prescription       Expected Outcomes  Short Term: Able to explain program exercise prescription;Long Term: Able to explain home exercise prescription to exercise independently          Copy of goals given to participant.

## 2018-07-28 NOTE — Progress Notes (Signed)
Cardiac Individual Treatment Plan  Patient Details  Name: Dale Mcdonald MRN: [id number] Date of Birth: 1950-06-25 Referring Provider:     Cardiac Rehab from 07/28/2018 in [facility] Cardiac and Pulmonary Rehab  Referring Provider  [place]      Initial Encounter Date:    Cardiac Rehab from 07/28/2018 in [facility] Cardiac and Pulmonary Rehab  Date  07/28/18      Visit Diagnosis: S/P CABG x 3  S/P mitral valve replacement  Patient's Home Medications on Admission:  Current Outpatient Medications:  .  aspirin EC 81 MG tablet, Take 81 mg by mouth daily., Disp: , Rfl:  .  atorvastatin (LIPITOR) 20 MG tablet, TAKE 1 TABLET BY MOUTH  DAILY, Disp: 90 tablet, Rfl: 3 .  diphenhydrAMINE (BENADRYL) 25 mg capsule, Take by mouth., Disp: , Rfl:  .  dolutegravir (TIVICAY) 50 MG tablet, TAKE 1 TABLET(50 MG) BY MOUTH DAILY, Disp: 30 tablet, Rfl: 5 .  emtricitabine-tenofovir AF (DESCOVY) 200-25 MG tablet, Take 1 tablet by mouth daily., Disp: 30 tablet, Rfl: 5 .  lisinopril (PRINIVIL,ZESTRIL) 5 MG tablet, Take 1 tablet by mouth daily., Disp: , Rfl:  .  Melatonin 5 MG TABS, Take 5 mg by mouth at bedtime. , Disp: , Rfl:  .  metoprolol succinate (TOPROL-XL) 25 MG 24 hr tablet, Take 1 tablet by mouth daily., Disp: , Rfl:  .  Multiple Vitamins-Minerals (MULTIVITAMIN ADULT PO), Take by mouth., Disp: , Rfl:  .  nystatin (MYCOSTATIN) 100000 UNIT/ML suspension, SWISH AND SWALLOW 5 MLS 4 (FOUR) TIMES DAILY FOR 10 DAYS, Disp: , Rfl:  .  rivaroxaban (XARELTO) 20 MG TABS tablet, Take by mouth., Disp: , Rfl:  .  sulfamethoxazole-trimethoprim (BACTRIM DS,SEPTRA DS) 800-160 MG tablet, Take 1 tablet by mouth 2 (two) times daily., Disp: 60 tablet, Rfl: 2 .  torsemide (DEMADEX) 20 MG tablet, Take 1 tablet by mouth daily. , Disp: , Rfl:  .  traZODone (DESYREL) 50 MG tablet, Take 0.5 tablets by mouth daily., Disp: , Rfl:  .  valACYclovir (VALTREX) 500 MG tablet, Take 1 tablet (500 mg total) by mouth daily. (Patient taking differently:  Take 500 mg by mouth daily as needed (for fever blisters.). ), Disp: 30 tablet, Rfl: 11  Past Medical History: Past Medical History:  Diagnosis Date  . Anxiety   . Anxiety 03/29/2017  . Heart murmur   . Hepatitis    B  . HIV (human immunodeficiency virus infection) ([place])   . Hyperlipidemia   . Hypertension   . Palpitations     Tobacco Use: Social History   Tobacco Use  Smoking Status Former Smoker  . Packs/day: 2.00  . Years: 40.00  . Pack years: 80.00  . Types: Cigarettes  Smokeless Tobacco Never Used  Tobacco Comment   Quit 05/25/2017    Labs: Recent Review Flowsheet Data    Labs for ITP Cardiac and Pulmonary Rehab Latest Ref Rng & Units 12/25/2015 02/24/2016 12/28/2016 04/27/2017 03/08/2018   Cholestrol <200 mg/dL 164 165 159 128 133   LDLCALC mg/dL (calc) 87 91 - 73 73   LDLDIRECT mg/dL - - 79.0 - -   HDL >40 mg/dL 47.80 46 47.20 40(L) 46   Trlycerides <150 mg/dL 147.0 138 209.0(H) 69 62   Hemoglobin A1c <5.7 % of total Hgb - 6.2(H) - 5.7(H) 6.3(H)       Exercise Target Goals: Exercise Program Goal: Individual exercise prescription set using results from initial 6 min walk test and THRR while considering  patient's activity barriers  and safety.   Exercise Prescription Goal: Initial exercise prescription builds to 30-45 minutes a day of aerobic activity, 2-3 days per week.  Home exercise guidelines will be given to patient during program as part of exercise prescription that the participant will acknowledge.  Activity Barriers & Risk Stratification: Activity Barriers & Cardiac Risk Stratification - 07/28/18 1355      Activity Barriers & Cardiac Risk Stratification   Activity Barriers  Incisional Pain;Shortness of Breath    Cardiac Risk Stratification  High       6 Minute Walk: 6 Minute Walk    Row Name 07/28/18 1442         6 Minute Walk   Phase  Initial     Distance  1142 feet     Walk Time  5.5 minutes     # of Rest Breaks  1     MPH  2.36     METS   3     RPE  13     Perceived Dyspnea   3.5     VO2 Peak  10.77     Symptoms  Yes (comment)     Comments  SOB - has had sporadically since returning from travel     Resting HR  80 bpm     Resting BP  96/54     Resting Oxygen Saturation   100 %     Exercise Oxygen Saturation  during 6 min walk  98 %     Max Ex. HR  101 bpm     Max Ex. BP  130/66     2 Minute Post BP  106/62        Oxygen Initial Assessment:   Oxygen Re-Evaluation:   Oxygen Discharge (Final Oxygen Re-Evaluation):   Initial Exercise Prescription: Initial Exercise Prescription - 07/28/18 1400      Date of Initial Exercise RX and Referring Provider   Date  07/28/18    Referring Provider  Letvak      Treadmill   MPH  2.3    Grade  1    Minutes  15    METs  3      Recumbant Bike   Level  2    RPM  60    Watts  25    Minutes  15    METs  3      NuStep   Level  3    SPM  80    Minutes  15    METs  3      Prescription Details   Frequency (times per week)  3    Duration  Progress to 45 minutes of aerobic exercise without signs/symptoms of physical distress      Intensity   THRR 40-80% of Max Heartrate  109-138    Ratings of Perceived Exertion  11-13    Perceived Dyspnea  0-4      Resistance Training   Training Prescription  Yes    Weight  3 lb    Reps  10-15       Perform Capillary Blood Glucose checks as needed.  Exercise Prescription Changes: Exercise Prescription Changes    Row Name 07/28/18 1400             Response to Exercise   Blood Pressure (Admit)  96/54       Blood Pressure (Exercise)  130/66       Blood Pressure (Exit)  106/62  Heart Rate (Admit)  80 bpm       Heart Rate (Exercise)  101 bpm       Heart Rate (Exit)  85 bpm       Oxygen Saturation (Admit)  100 %       Oxygen Saturation (Exit)  98 %       Rating of Perceived Exertion (Exercise)  13          Exercise Comments:   Exercise Goals and Review: Exercise Goals    Row Name 07/28/18 1441              Exercise Goals   Increase Physical Activity  Yes       Intervention  Provide advice, education, support and counseling about physical activity/exercise needs.;Develop an individualized exercise prescription for aerobic and resistive training based on initial evaluation findings, risk stratification, comorbidities and participant's personal goals.       Expected Outcomes  Short Term: Attend rehab on a regular basis to increase amount of physical activity.;Long Term: Add in home exercise to make exercise part of routine and to increase amount of physical activity.;Long Term: Exercising regularly at least 3-5 days a week.       Increase Strength and Stamina  Yes       Intervention  Provide advice, education, support and counseling about physical activity/exercise needs.;Develop an individualized exercise prescription for aerobic and resistive training based on initial evaluation findings, risk stratification, comorbidities and participant's personal goals.       Expected Outcomes  Short Term: Increase workloads from initial exercise prescription for resistance, speed, and METs.;Short Term: Perform resistance training exercises routinely during rehab and add in resistance training at home;Long Term: Improve cardiorespiratory fitness, muscular endurance and strength as measured by increased METs and functional capacity (6MWT)       Able to understand and use rate of perceived exertion (RPE) scale  Yes       Intervention  Provide education and explanation on how to use RPE scale       Expected Outcomes  Short Term: Able to use RPE daily in rehab to express subjective intensity level;Long Term:  Able to use RPE to guide intensity level when exercising independently       Able to understand and use Dyspnea scale  Yes       Intervention  Provide education and explanation on how to use Dyspnea scale       Expected Outcomes  Short Term: Able to use Dyspnea scale daily in rehab to express subjective sense of  shortness of breath during exertion;Long Term: Able to use Dyspnea scale to guide intensity level when exercising independently       Knowledge and understanding of Target Heart Rate Range (THRR)  Yes       Intervention  Provide education and explanation of THRR including how the numbers were predicted and where they are located for reference       Expected Outcomes  Short Term: Able to state/look up THRR;Short Term: Able to use daily as guideline for intensity in rehab;Long Term: Able to use THRR to govern intensity when exercising independently       Able to check pulse independently  Yes       Intervention  Provide education and demonstration on how to check pulse in carotid and radial arteries.;Review the importance of being able to check your own pulse for safety during independent exercise       Expected Outcomes  Short  Term: Able to explain why pulse checking is important during independent exercise;Long Term: Able to check pulse independently and accurately       Understanding of Exercise Prescription  Yes       Intervention  Provide education, explanation, and written materials on patient's individual exercise prescription       Expected Outcomes  Short Term: Able to explain program exercise prescription;Long Term: Able to explain home exercise prescription to exercise independently          Exercise Goals Re-Evaluation :   Discharge Exercise Prescription (Final Exercise Prescription Changes): Exercise Prescription Changes - 07/28/18 1400      Response to Exercise   Blood Pressure (Admit)  96/54    Blood Pressure (Exercise)  130/66    Blood Pressure (Exit)  106/62    Heart Rate (Admit)  80 bpm    Heart Rate (Exercise)  101 bpm    Heart Rate (Exit)  85 bpm    Oxygen Saturation (Admit)  100 %    Oxygen Saturation (Exit)  98 %    Rating of Perceived Exertion (Exercise)  13       Nutrition:  Target Goals: Understanding of nutrition guidelines, daily intake of sodium <1555m,  cholesterol <2049m calories 30% from fat and 7% or less from saturated fats, daily to have 5 or more servings of fruits and vegetables.  Biometrics: Pre Biometrics - 07/28/18 1441      Pre Biometrics   Height  _0  (1.803 m)    Weight  172 lb 8 oz (78.2 kg)    Waist Circumference  39 inches    Hip Circumference  38 inches    Waist to Hip Ratio  1.03 %    BMI (Calculated)  24.07    Single Leg Stand  30 seconds        Nutrition Therapy Plan and Nutrition Goals: Nutrition Therapy & Goals - 07/28/18 1412      Intervention Plan   Intervention  Prescribe, educate and counsel regarding individualized specific dietary modifications aiming towards targeted core components such as weight, hypertension, lipid management, diabetes, heart failure and other comorbidities.    Expected Outcomes  Short Term Goal: Understand basic principles of dietary content, such as calories, fat, sodium, cholesterol and nutrients.;Short Term Goal: A plan has been developed with personal nutrition goals set during dietitian appointment.;Long Term Goal: Adherence to prescribed nutrition plan.       Nutrition Assessments: Nutrition Assessments - 07/28/18 1412      MEDFICTS Scores   Pre Score  30       Nutrition Goals Re-Evaluation:   Nutrition Goals Discharge (Final Nutrition Goals Re-Evaluation):   Psychosocial: Target Goals: Acknowledge presence or absence of significant depression and/or stress, maximize coping skills, provide positive support system. Participant is able to verbalize types and ability to use techniques and skills needed for reducing stress and depression.   Initial Review & Psychosocial Screening: Initial Psych Review & Screening - 07/28/18 1409      Initial Review   Current issues with  Current Sleep Concerns;Current Stress Concerns    Source of Stress Concerns  Unable to perform yard/household activities    Comments  -Surgery done in Novemeber, then several surgeries Dec for  infection. Still has 10 pound weight limit for lifting.      Family Dynamics   Good Support System?  Yes   Sisters and son   [place] of state     Barriers  Psychosocial barriers to participate in program  There are no identifiable barriers or psychosocial needs.;The patient should benefit from training in stress management and relaxation.      Screening Interventions   Interventions  Encouraged to exercise;To provide support and resources with identified psychosocial needs;Provide feedback about the scores to participant    Expected Outcomes  Short Term goal: Utilizing psychosocial counselor, staff and physician to assist with identification of specific Stressors or current issues interfering with healing process. Setting desired goal for each stressor or current issue identified.;Long Term Goal: Stressors or current issues are controlled or eliminated.;Short Term goal: Identification and review with participant of any Quality of Life or Depression concerns found by scoring the questionnaire.;Long Term goal: The participant improves quality of Life and PHQ9 Scores as seen by post scores and/or verbalization of changes       Quality of Life Scores:  Quality of Life - 07/28/18 1412      Quality of Life   Select  Quality of Life      Quality of Life Scores   Health/Function Pre  13.47 %    Socioeconomic Pre  20.07 %    Psych/Spiritual Pre  13.86 %    Family Pre  19.5 %    GLOBAL Pre  15.79 %      Scores of 19 and below usually indicate a poorer quality of life in these areas.  A difference of  2-3 points is a clinically meaningful difference.  A difference of 2-3 points in the total score of the Quality of Life Index has been associated with significant improvement in overall quality of life, self-image, physical symptoms, and general health in studies assessing change in quality of life.  PHQ-9: Recent Review Flowsheet Data    Depression screen [facility] 2/9 07/28/2018 07/12/2018  03/30/2018 01/03/2018 03/29/2017   Decreased Interest 1 0 1 0 0   Down, Depressed, Hopeless 1 0 1 1 0   PHQ - 2 Score 2 0 2 1 0   Altered sleeping 3 - 1 - -   Tired, decreased energy 2 - 1 - -   Change in appetite 1 - 1 - -   Feeling bad or failure about yourself  1 - 1 - -   Trouble concentrating 0 - 1 - -   Moving slowly or fidgety/restless 0 - 1 - -   Suicidal thoughts 0 - 1 - -   PHQ-9 Score 9 - 9 - -   Difficult doing work/chores Somewhat difficult - - - -     Interpretation of Total Score  Total Score Depression Severity:  1-4 = Minimal depression, 5-9 = Mild depression, 10-14 = Moderate depression, 15-19 = Moderately severe depression, 20-27 = Severe depression   Psychosocial Evaluation and Intervention:   Psychosocial Re-Evaluation:   Psychosocial Discharge (Final Psychosocial Re-Evaluation):   Vocational Rehabilitation: Provide vocational rehab assistance to qualifying candidates.   Vocational Rehab Evaluation & Intervention: Vocational Rehab - 07/28/18 1414      Initial Vocational Rehab Evaluation & Intervention   Assessment shows need for Vocational Rehabilitation  No       Education: Education Goals: Education classes will be provided on a variety of topics geared toward better understanding of heart health and risk factor modification. Participant will state understanding/return demonstration of topics presented as noted by education test scores.  Learning Barriers/Preferences: Learning Barriers/Preferences - 07/28/18 1412      Learning Barriers/Preferences   Learning Barriers  None  Learning Preferences  Individual Instruction;Group Instruction;Verbal Instruction;Skilled Demonstration       Education Topics:  AED/CPR: - Group verbal and written instruction with the use of models to demonstrate the basic use of the AED with the basic ABC's of resuscitation.   General Nutrition Guidelines/Fats and Fiber: -Group instruction provided by verbal,  written material, models and posters to present the general guidelines for heart healthy nutrition. Gives an explanation and review of dietary fats and fiber.   Controlling Sodium/Reading Food Labels: -Group verbal and written material supporting the discussion of sodium use in heart healthy nutrition. Review and explanation with models, verbal and written materials for utilization of the food label.   Exercise Physiology & General Exercise Guidelines: - Group verbal and written instruction with models to review the exercise physiology of the cardiovascular system and associated critical values. Provides general exercise guidelines with specific guidelines to those with heart or lung disease.    Aerobic Exercise & Resistance Training: - Gives group verbal and written instruction on the various components of exercise. Focuses on aerobic and resistive training programs and the benefits of this training and how to safely progress through these programs..   Flexibility, Balance, Mind/Body Relaxation: Provides group verbal/written instruction on the benefits of flexibility and balance training, including mind/body exercise modes such as yoga, pilates and tai chi.  Demonstration and skill practice provided.   Stress and Anxiety: - Provides group verbal and written instruction about the health risks of elevated stress and causes of high stress.  Discuss the correlation between heart/lung disease and anxiety and treatment options. Review healthy ways to manage with stress and anxiety.   Depression: - Provides group verbal and written instruction on the correlation between heart/lung disease and depressed mood, treatment options, and the stigmas associated with seeking treatment.   Anatomy & Physiology of the Heart: - Group verbal and written instruction and models provide basic cardiac anatomy and physiology, with the coronary electrical and arterial systems. Review of Valvular disease and Heart  Failure   Cardiac Procedures: - Group verbal and written instruction to review commonly prescribed medications for heart disease. Reviews the medication, class of the drug, and side effects. Includes the steps to properly store meds and maintain the prescription regimen. (beta blockers and nitrates)   Cardiac Medications I: - Group verbal and written instruction to review commonly prescribed medications for heart disease. Reviews the medication, class of the drug, and side effects. Includes the steps to properly store meds and maintain the prescription regimen.   Cardiac Medications II: -Group verbal and written instruction to review commonly prescribed medications for heart disease. Reviews the medication, class of the drug, and side effects. (all other drug classes)    Go Sex-Intimacy & Heart Disease, Get SMART - Goal Setting: - Group verbal and written instruction through game format to discuss heart disease and the return to sexual intimacy. Provides group verbal and written material to discuss and apply goal setting through the application of the S.M.A.R.T. Method.   Other Matters of the Heart: - Provides group verbal, written materials and models to describe Stable Angina and Peripheral Artery. Includes description of the disease process and treatment options available to the cardiac patient.   Exercise & Equipment Safety: - Individual verbal instruction and demonstration of equipment use and safety with use of the equipment.   Cardiac Rehab from 07/28/2018 in [facility] Cardiac and Pulmonary Rehab  Date  07/28/18  Educator  mc  Instruction Review Code  1- Verbalizes Understanding  Infection Prevention: - Provides verbal and written material to individual with discussion of infection control including proper hand washing and proper equipment cleaning during exercise session.   Cardiac Rehab from 07/28/2018 in [facility] Cardiac and Pulmonary Rehab  Date  07/28/18  Educator  mc   Instruction Review Code  1- Verbalizes Understanding      Falls Prevention: - Provides verbal and written material to individual with discussion of falls prevention and safety.   Cardiac Rehab from 07/28/2018 in [facility] Cardiac and Pulmonary Rehab  Date  07/28/18  Educator  mc  Instruction Review Code  1- Verbalizes Understanding      Diabetes: - Individual verbal and written instruction to review signs/symptoms of diabetes, desired ranges of glucose level fasting, after meals and with exercise. Acknowledge that pre and post exercise glucose checks will be done for 3 sessions at entry of program.   Know Your Numbers and Risk Factors: -Group verbal and written instruction about important numbers in your health.  Discussion of what are risk factors and how they play a role in the disease process.  Review of Cholesterol, Blood Pressure, Diabetes, and BMI and the role they play in your overall health.   Sleep Hygiene: -Provides group verbal and written instruction about how sleep can affect your health.  Define sleep hygiene, discuss sleep cycles and impact of sleep habits. Review good sleep hygiene tips.    Other: -Provides group and verbal instruction on various topics (see comments)   Knowledge Questionnaire Score: Knowledge Questionnaire Score - 07/28/18 1413      Knowledge Questionnaire Score   Pre Score  23/26   Correct responses were reviewed with Bill today. He verbalized understanding of the responses.       Core Components/Risk Factors/Patient Goals at Admission: Personal Goals and Risk Factors at Admission - 07/28/18 1414      Core Components/Risk Factors/Patient Goals on Admission    Weight Management  Yes;Weight Maintenance    Intervention  Weight Management: Develop a combined nutrition and exercise program designed to reach desired caloric intake, while maintaining appropriate intake of nutrient and fiber, sodium and fats, and appropriate energy expenditure required  for the weight goal.;Weight Management: Provide education and appropriate resources to help participant work on and attain dietary goals.   Lost 15 pounds since surgery. Wants to keep weight where he is now  172.5 lbs   Expected Outcomes  Short Term: Continue to assess and modify interventions until short term weight is achieved;Long Term: Adherence to nutrition and physical activity/exercise program aimed toward attainment of established weight goal    Improve shortness of breath with ADL's  Yes    Intervention  Provide education, individualized exercise plan and daily activity instruction to help decrease symptoms of SOB with activities of daily living.    Expected Outcomes  Short Term: Improve cardiorespiratory fitness to achieve a reduction of symptoms when performing ADLs;Long Term: Be able to perform more ADLs without symptoms or delay the onset of symptoms    Hypertension  Yes    Intervention  Provide education on lifestyle modifcations including regular physical activity/exercise, weight management, moderate sodium restriction and increased consumption of fresh fruit, vegetables, and low fat dairy, alcohol moderation, and smoking cessation.;Monitor prescription use compliance.    Expected Outcomes  Short Term: Continued assessment and intervention until BP is < 140/30m HG in hypertensive participants. < 130/867mHG in hypertensive participants with diabetes, heart failure or chronic kidney disease.;Long Term: Maintenance of blood pressure at goal levels.  Lipids  Yes    Intervention  Provide education and support for participant on nutrition & aerobic/resistive exercise along with prescribed medications to achieve LDL <21m, HDL >432m    Expected Outcomes  Short Term: Participant states understanding of desired cholesterol values and is compliant with medications prescribed. Participant is following exercise prescription and nutrition guidelines.;Long Term: Cholesterol controlled with  medications as prescribed, with individualized exercise RX and with personalized nutrition plan. Value goals: LDL < 7064mHDL > 40 mg.       Core Components/Risk Factors/Patient Goals Review:    Core Components/Risk Factors/Patient Goals at Discharge (Final Review):    ITP Comments: ITP Comments    Row Name 07/28/18 1335           ITP Comments  Medical review and evaluation completed today. ITP sent to Dr MilSabra Heckr review,changes as needed and signature. [facility]          Comments: initial ITP

## 2018-08-01 DIAGNOSIS — S025XXA Fracture of tooth (traumatic), initial encounter for closed fracture: Secondary | ICD-10-CM | POA: Insufficient documentation

## 2018-08-03 ENCOUNTER — Other Ambulatory Visit: Payer: Self-pay

## 2018-08-03 DIAGNOSIS — Z7901 Long term (current) use of anticoagulants: Secondary | ICD-10-CM | POA: Diagnosis not present

## 2018-08-03 DIAGNOSIS — Z951 Presence of aortocoronary bypass graft: Secondary | ICD-10-CM | POA: Diagnosis not present

## 2018-08-03 DIAGNOSIS — E785 Hyperlipidemia, unspecified: Secondary | ICD-10-CM | POA: Diagnosis not present

## 2018-08-03 DIAGNOSIS — Z952 Presence of prosthetic heart valve: Secondary | ICD-10-CM

## 2018-08-03 DIAGNOSIS — R002 Palpitations: Secondary | ICD-10-CM | POA: Diagnosis not present

## 2018-08-03 DIAGNOSIS — I10 Essential (primary) hypertension: Secondary | ICD-10-CM | POA: Diagnosis not present

## 2018-08-03 DIAGNOSIS — Z87891 Personal history of nicotine dependence: Secondary | ICD-10-CM | POA: Diagnosis not present

## 2018-08-03 DIAGNOSIS — Z7982 Long term (current) use of aspirin: Secondary | ICD-10-CM | POA: Diagnosis not present

## 2018-08-03 DIAGNOSIS — Z79899 Other long term (current) drug therapy: Secondary | ICD-10-CM | POA: Diagnosis not present

## 2018-08-03 DIAGNOSIS — R011 Cardiac murmur, unspecified: Secondary | ICD-10-CM | POA: Diagnosis not present

## 2018-08-03 NOTE — Progress Notes (Signed)
Daily Session Note  Patient Details  Name: DANIELA HERNAN MRN: [id number] Date of Birth: May 14, 1951 Referring Provider:     Cardiac Rehab from 07/28/2018 in [facility]  Referring Provider  Silvio Pate      Encounter Date: 08/03/2018  Check In: Session Check In - 08/03/18 1615      Check-In   Supervising physician immediately available to respond to emergencies  See telemetry face sheet for immediately available ER MD    Location  [facility]-Cardiac & Pulmonary Rehab    Staff Present  Nyoka Cowden, RN, BSN, MA;Meredith Sherryll Burger, RN BSN;Jeanna Durrell BS, Exercise Physiologist;Zakara Parkey Oletta Darter, BA, ACSM CEP, Exercise Physiologist    Medication changes reported      No    Fall or balance concerns reported     No    Warm-up and Cool-down  Performed as group-led instruction    Resistance Training Performed  Yes    VAD Patient?  No    PAD/SET Patient?  No      Pain Assessment   Currently in Pain?  No/denies    Multiple Pain Sites  No          Social History   Tobacco Use  Smoking Status Former Smoker  . Packs/day: 2.00  . Years: 40.00  . Pack years: 80.00  . Types: Cigarettes  Smokeless Tobacco Never Used  Tobacco Comment   Quit 05/25/2017    Goals Met:  Independence with exercise equipment Exercise tolerated well No report of cardiac concerns or symptoms Strength training completed today  Goals Unmet:  Not Applicable  Comments: First full day of exercise!  Patient was oriented to gym and equipment including functions, settings, policies, and procedures.  Patient's individual exercise prescription and treatment plan were reviewed.  All starting workloads were established based on the results of the 6 minute walk test done at initial orientation visit.  The plan for exercise progression was also introduced and progression will be customized based on patient's performance and goals.    Dr. Emily Filbert is Medical Director for [facility].

## 2018-08-04 DIAGNOSIS — R011 Cardiac murmur, unspecified: Secondary | ICD-10-CM | POA: Diagnosis not present

## 2018-08-04 DIAGNOSIS — Z952 Presence of prosthetic heart valve: Secondary | ICD-10-CM | POA: Diagnosis not present

## 2018-08-04 DIAGNOSIS — R002 Palpitations: Secondary | ICD-10-CM | POA: Diagnosis not present

## 2018-08-04 DIAGNOSIS — Z951 Presence of aortocoronary bypass graft: Secondary | ICD-10-CM

## 2018-08-04 DIAGNOSIS — Z87891 Personal history of nicotine dependence: Secondary | ICD-10-CM | POA: Diagnosis not present

## 2018-08-04 DIAGNOSIS — Z79899 Other long term (current) drug therapy: Secondary | ICD-10-CM | POA: Diagnosis not present

## 2018-08-04 DIAGNOSIS — Z7982 Long term (current) use of aspirin: Secondary | ICD-10-CM | POA: Diagnosis not present

## 2018-08-04 DIAGNOSIS — I10 Essential (primary) hypertension: Secondary | ICD-10-CM | POA: Diagnosis not present

## 2018-08-04 DIAGNOSIS — E785 Hyperlipidemia, unspecified: Secondary | ICD-10-CM | POA: Diagnosis not present

## 2018-08-04 DIAGNOSIS — Z7901 Long term (current) use of anticoagulants: Secondary | ICD-10-CM | POA: Diagnosis not present

## 2018-08-04 NOTE — Progress Notes (Signed)
Daily Session Note  Patient Details  Name: Dale Mcdonald MRN: [id number] Date of Birth: 1951-01-29 Referring Provider:     Cardiac Rehab from 07/28/2018 in [facility] Cardiac [facility] Pul[facility] Rehab  Referring Provider  Silvio Pate      Encounter Date: 08/04/2018  Check In: Session Check In - 08/04/18 1613      Check-In   Supervising physician immediately available to respond to emergencies  See telemetry face sheet for immediately available ER MD    Location  [facility]Cardiac [facility] Pulmonary Re[facility]    Staff Present  Justin Mend Jaci Carrel, BS, ACSM CEP, Exercise Physiologist;Meredith Sherryll Burger, RN BSN    Medication changes reported      No    Fall or balance concerns reported     No    Warm-up and Cool-down  Performed as group-led Higher education careers adviser Performed  Yes    VAD Patient?  No    PAD/SET Patient?  No      Pain Assessment   Currently in Pain?  No/denies          Social History   Tobacco Use  Smoking Status Former Smoker  . Packs/day: 2.00  . Years: 40.00  . Pack years: 80.00  . Types: Cigarettes  Smokeless Tobacco Never Used  Tobacco Comment   Quit 05/25/2017    Goals Met:  Independence with exercise equipment Exercise tolerated well No report of cardiac concerns or symptoms Strength training completed today  Goals Unmet:  Not Applicable  Comments: Pt able to follow exercise prescription today without complaint.  Will continue to monitor for progression.    Dr. Emily Filbert is Medical Director for [facility].

## 2018-08-10 ENCOUNTER — Other Ambulatory Visit: Payer: Self-pay

## 2018-08-10 ENCOUNTER — Ambulatory Visit: Payer: Medicare Other | Admitting: Family

## 2018-08-10 ENCOUNTER — Encounter: Payer: Self-pay | Admitting: Family

## 2018-08-10 VITALS — BP 135/80 | HR 98 | Temp 97.9°F | Wt 169.0 lb

## 2018-08-10 DIAGNOSIS — R7881 Bacteremia: Secondary | ICD-10-CM | POA: Diagnosis not present

## 2018-08-10 DIAGNOSIS — M869 Osteomyelitis, unspecified: Secondary | ICD-10-CM | POA: Diagnosis not present

## 2018-08-10 DIAGNOSIS — B9562 Methicillin resistant Staphylococcus aureus infection as the cause of diseases classified elsewhere: Secondary | ICD-10-CM

## 2018-08-10 NOTE — Patient Instructions (Signed)
Nice to see you.  Continue to take your Bactrim pending the blood work results.   We will decide additional treatment based on blood work.

## 2018-08-10 NOTE — Assessment & Plan Note (Signed)
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 He has completed 6 weeks of IV therapy and 1 month of Bactrim and currently doing well. Will check inflammatory markers and CMET today. If improved will consider stopping Bactrim. May need to treat for up to 3 months pending blood work. Follow up pending blood work results.

## 2018-08-10 NOTE — Progress Notes (Signed)
Subjective:    Patient ID: Dale Mcdonald, male    DOB: 26-Sep-1950, 68 y.o.   MRN: [id number]  Chief Complaint  Patient presents with  . Osteomyelitis   . MRSA Bacteremia     HPI:  Dale Mcdonald is a 68 y.o. male who presents today for follow up of osteomyelitis and bacteremia.   Dale Mcdonald was last seen in the office on 07/12/2018 following 3 vessel CABG and bovine mitral valve replacement complicated by MRSA bacteremia and osteomyelitis of the sternum requiring multiple debridement procedures and finalized with a flap procedure. He had completed 6 weeks of IV therapy with vancomycin and his IV medication was changed to doxycycline. Inflammatory marker remained slightly elevated at ESR 54 and CRP of 16. He was not able to tolerate the doxycycline and was switched to Bactrim.  Dale Mcdonald has been taking his Bactrim as prescribed with no adverse side effects or missed doses. Overall feeling well. He does continue to have soreness in his chest at times and has decreased taste which he notes has been going on since November. Denies fevers, chills, or sweats. Most recently started cardiac rehabilitation.    Allergies  Allergen Reactions  . Magnesium Nausea Only      Outpatient Medications Prior to Visit  Medication Sig Dispense Refill  . aspirin EC 81 MG tablet Take 81 mg by mouth daily.    Marland Kitchen atorvastatin (LIPITOR) 20 MG tablet TAKE 1 TABLET BY MOUTH  DAILY 90 tablet 3  . diphenhydrAMINE (BENADRYL) 25 mg capsule Take by mouth.    . dolutegravir (TIVICAY) 50 MG tablet TAKE 1 TABLET(50 MG) BY MOUTH DAILY 30 tablet 5  . emtricitabine-tenofovir AF (DESCOVY) 200-25 MG tablet Take 1 tablet by mouth daily. 30 tablet 5  . lisinopril (PRINIVIL,ZESTRIL) 5 MG tablet Take 1 tablet by mouth daily.    . Melatonin 5 MG TABS Take 5 mg by mouth at bedtime.     . metoprolol succinate (TOPROL-XL) 25 MG 24 hr tablet Take 1 tablet by mouth daily.    . Multiple Vitamins-Minerals (MULTIVITAMIN ADULT PO)  Take by mouth.    . nystatin (MYCOSTATIN) 100000 UNIT/ML suspension SWISH AND SWALLOW 5 MLS 4 (FOUR) TIMES DAILY FOR 10 DAYS    . rivaroxaban (XARELTO) 20 MG TABS tablet Take by mouth.    . sulfamethoxazole-trimethoprim (BACTRIM DS,SEPTRA DS) 800-160 MG tablet Take 1 tablet by mouth 2 (two) times daily. 60 tablet 2  . torsemide (DEMADEX) 20 MG tablet Take 1 tablet by mouth daily.     . traZODone (DESYREL) 50 MG tablet Take 0.5 tablets by mouth daily.    . valACYclovir (VALTREX) 500 MG tablet Take 1 tablet (500 mg total) by mouth daily. (Patient taking differently: Take 500 mg by mouth daily as needed (for fever blisters.). ) 30 tablet 11   No facility-administered medications prior to visit.      Past Medical History:  Diagnosis Date  . Anxiety   . Anxiety 03/29/2017  . Heart murmur   . Hepatitis    B  . HIV (human immunodeficiency virus infection) ([place])   . Hyperlipidemia   . Hypertension   . Palpitations      Past Surgical History:  Procedure Laterality Date  . CATARACT EXTRACTION W/PHACO Left 11/11/2017   Procedure: CATARACT EXTRACTION PHACO AND INTRAOCULAR LENS PLACEMENT (IOC);  Surgeon: Birder Robson, MD;  Location: [facility] ORS;  Service: Ophthalmology;  Laterality: Left;  [place] 00:59.3 AP% 19.5 CDE 11.54 Fluid pack lot #  [id number]  . CATARACT EXTRACTION W/PHACO Right 12/21/2017   Procedure: CATARACT EXTRACTION PHACO AND INTRAOCULAR LENS PLACEMENT (IOC);  Surgeon: Birder Robson, MD;  Location: [facility] ORS;  Service: Ophthalmology;  Laterality: Right;  [place] 00:40 AP% 13.5 CDE 5.50 Fluid pack lot $ [id number]  . COLONOSCOPY    . COLONOSCOPY WITH PROPOFOL N/A 02/08/2018   Procedure: COLONOSCOPY WITH PROPOFOL;  Surgeon: Jonathon Bellows, MD;  Location: [facility] ENDOSCOPY;  Service: Gastroenterology;  Laterality: N/A;  . CORONARY ARTERY BYPASS GRAFT  04/07/2018  . EYE SURGERY     RETINA  . HERNIA REPAIR    . MITRAL VALVE REPLACEMENT  03/2018   bovine--with CABG    Review of Systems   Constitutional: Negative for chills, fatigue, fever and unexpected weight change.  Respiratory: Negative for cough, chest tightness and wheezing.   Cardiovascular: Negative for chest pain.  Gastrointestinal: Negative for abdominal pain, constipation, diarrhea and nausea.      Objective:    BP 135/80   Pulse 98   Temp 97.9 F (36.6 C)   Wt 169 lb (76.7 kg)   BMI 23.57 kg/m  Nursing note and vital signs reviewed.  Physical Exam Constitutional:      General: He is not in acute distress.    Appearance: He is well-developed.  Cardiovascular:     Rate and Rhythm: Normal rate and regular rhythm.     Heart sounds: Normal heart sounds.     Comments: Surgical site appears well approximated with good scar formation and no evidence of infection.  Pulmonary:     Effort: Pulmonary effort is normal.     Breath sounds: Normal breath sounds.  Skin:    General: Skin is warm and dry.  Neurological:     Mental Status: He is alert.        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 He has completed 6 weeks of IV therapy and 1 month of Bactrim and currently doing well. Will check inflammatory markers and CMET today. If improved will consider stopping Bactrim. May need to treat for up to 3 months pending blood work. Follow up pending blood work results.      Relevant Orders   Comp Met (CMET)   C-reactive protein   Sedimentation rate     Other   MRSA bacteremia    Resolved with vancomycin with no further indication for treatment at the present time.           I am having Dale Mcdonald maintain his aspirin EC, valACYclovir, Melatonin, emtricitabine-tenofovir AF, dolutegravir, atorvastatin, metoprolol succinate, torsemide, traZODone, lisinopril,  sulfamethoxazole-trimethoprim, Multiple Vitamins-Minerals (MULTIVITAMIN ADULT PO), diphenhydrAMINE, nystatin, and rivaroxaban.   Follow-up: Return in about 2 months (around 10/10/2018), or if symptoms worsen or fail to improve.   Terri Piedra, MSN, FNP-C Nurse Practitioner [facility] [place] [facility] Office phone: [phone] Pager: [place] number: [phone]

## 2018-08-10 NOTE — Assessment & Plan Note (Signed)
Resolved with vancomycin with no further indication for treatment at the present time.

## 2018-08-11 LAB — COMPREHENSIVE METABOLIC PANEL
AG Ratio: 1.2 (calc) (ref 1.0–2.5)
ALT: 30 U/L (ref 9–46)
AST: 36 U/L — ABNORMAL HIGH (ref 10–35)
Albumin: 4.3 g/dL (ref 3.6–5.1)
Alkaline phosphatase (APISO): 278 U/L — ABNORMAL HIGH (ref 35–144)
BUN: 16 mg/dL (ref 7–25)
CO2: 25 mmol/L (ref 20–32)
Calcium: 9.7 mg/dL (ref 8.6–10.3)
Chloride: 101 mmol/L (ref 98–110)
Creat: 1.17 mg/dL (ref 0.70–1.25)
Globulin: 3.6 g/dL (calc) (ref 1.9–3.7)
Glucose, Bld: 91 mg/dL (ref 65–99)
Potassium: 4.5 mmol/L (ref 3.5–5.3)
Sodium: 136 mmol/L (ref 135–146)
Total Bilirubin: 0.5 mg/dL (ref 0.2–1.2)
Total Protein: 7.9 g/dL (ref 6.1–8.1)

## 2018-08-11 LAB — SEDIMENTATION RATE: Sed Rate: 45 mm/h — ABNORMAL HIGH (ref 0–20)

## 2018-08-11 LAB — C-REACTIVE PROTEIN: CRP: 9.2 mg/L — ABNORMAL HIGH (ref <8.0)

## 2018-08-12 ENCOUNTER — Telehealth: Payer: Self-pay | Admitting: Family

## 2018-08-12 DIAGNOSIS — M869 Osteomyelitis, unspecified: Secondary | ICD-10-CM

## 2018-08-12 NOTE — Telephone Encounter (Signed)
Spoke with Dale Mcdonald regarding lab results and the recommendation to continue medication for at least 1 more month. Will place orders for CRP and ESR in 1 month and can reconsider discontinuation at that time.

## 2018-08-17 ENCOUNTER — Encounter: Payer: Self-pay | Admitting: *Deleted

## 2018-08-17 DIAGNOSIS — Z952 Presence of prosthetic heart valve: Secondary | ICD-10-CM

## 2018-08-17 DIAGNOSIS — Z951 Presence of aortocoronary bypass graft: Secondary | ICD-10-CM

## 2018-08-17 NOTE — Progress Notes (Signed)
Cardiac Individual Treatment Plan  Patient Details  Name: Dale Mcdonald MRN: [id number] Date of Birth: 1950-06-25 Referring Provider:     Cardiac Rehab from 07/28/2018 in [facility] Cardiac and Pulmonary Rehab  Referring Provider  [place]      Initial Encounter Date:    Cardiac Rehab from 07/28/2018 in [facility] Cardiac and Pulmonary Rehab  Date  07/28/18      Visit Diagnosis: S/P CABG x 3  S/P mitral valve replacement  Patient's Home Medications on Admission:  Current Outpatient Medications:  .  aspirin EC 81 MG tablet, Take 81 mg by mouth daily., Disp: , Rfl:  .  atorvastatin (LIPITOR) 20 MG tablet, TAKE 1 TABLET BY MOUTH  DAILY, Disp: 90 tablet, Rfl: 3 .  diphenhydrAMINE (BENADRYL) 25 mg capsule, Take by mouth., Disp: , Rfl:  .  dolutegravir (TIVICAY) 50 MG tablet, TAKE 1 TABLET(50 MG) BY MOUTH DAILY, Disp: 30 tablet, Rfl: 5 .  emtricitabine-tenofovir AF (DESCOVY) 200-25 MG tablet, Take 1 tablet by mouth daily., Disp: 30 tablet, Rfl: 5 .  lisinopril (PRINIVIL,ZESTRIL) 5 MG tablet, Take 1 tablet by mouth daily., Disp: , Rfl:  .  Melatonin 5 MG TABS, Take 5 mg by mouth at bedtime. , Disp: , Rfl:  .  metoprolol succinate (TOPROL-XL) 25 MG 24 hr tablet, Take 1 tablet by mouth daily., Disp: , Rfl:  .  Multiple Vitamins-Minerals (MULTIVITAMIN ADULT PO), Take by mouth., Disp: , Rfl:  .  nystatin (MYCOSTATIN) 100000 UNIT/ML suspension, SWISH AND SWALLOW 5 MLS 4 (FOUR) TIMES DAILY FOR 10 DAYS, Disp: , Rfl:  .  rivaroxaban (XARELTO) 20 MG TABS tablet, Take by mouth., Disp: , Rfl:  .  sulfamethoxazole-trimethoprim (BACTRIM DS,SEPTRA DS) 800-160 MG tablet, Take 1 tablet by mouth 2 (two) times daily., Disp: 60 tablet, Rfl: 2 .  torsemide (DEMADEX) 20 MG tablet, Take 1 tablet by mouth daily. , Disp: , Rfl:  .  traZODone (DESYREL) 50 MG tablet, Take 0.5 tablets by mouth daily., Disp: , Rfl:  .  valACYclovir (VALTREX) 500 MG tablet, Take 1 tablet (500 mg total) by mouth daily. (Patient taking differently:  Take 500 mg by mouth daily as needed (for fever blisters.). ), Disp: 30 tablet, Rfl: 11  Past Medical History: Past Medical History:  Diagnosis Date  . Anxiety   . Anxiety 03/29/2017  . Heart murmur   . Hepatitis    B  . HIV (human immunodeficiency virus infection) ([place])   . Hyperlipidemia   . Hypertension   . Palpitations     Tobacco Use: Social History   Tobacco Use  Smoking Status Former Smoker  . Packs/day: 2.00  . Years: 40.00  . Pack years: 80.00  . Types: Cigarettes  Smokeless Tobacco Never Used  Tobacco Comment   Quit 05/25/2017    Labs: Recent Review Flowsheet Data    Labs for ITP Cardiac and Pulmonary Rehab Latest Ref Rng & Units 12/25/2015 02/24/2016 12/28/2016 04/27/2017 03/08/2018   Cholestrol <200 mg/dL 164 165 159 128 133   LDLCALC mg/dL (calc) 87 91 - 73 73   LDLDIRECT mg/dL - - 79.0 - -   HDL >40 mg/dL 47.80 46 47.20 40(L) 46   Trlycerides <150 mg/dL 147.0 138 209.0(H) 69 62   Hemoglobin A1c <5.7 % of total Hgb - 6.2(H) - 5.7(H) 6.3(H)       Exercise Target Goals: Exercise Program Goal: Individual exercise prescription set using results from initial 6 min walk test and THRR while considering  patient's activity barriers  and safety.   Exercise Prescription Goal: Initial exercise prescription builds to 30-45 minutes a day of aerobic activity, 2-3 days per week.  Home exercise guidelines will be given to patient during program as part of exercise prescription that the participant will acknowledge.  Activity Barriers & Risk Stratification: Activity Barriers & Cardiac Risk Stratification - 07/28/18 1355      Activity Barriers & Cardiac Risk Stratification   Activity Barriers  Incisional Pain;Shortness of Breath    Cardiac Risk Stratification  High       6 Minute Walk: 6 Minute Walk    Row Name 07/28/18 1442         6 Minute Walk   Phase  Initial     Distance  1142 feet     Walk Time  5.5 minutes     # of Rest Breaks  1     MPH  2.36     METS   3     RPE  13     Perceived Dyspnea   3.5     VO2 Peak  10.77     Symptoms  Yes (comment)     Comments  SOB - has had sporadically since returning from travel     Resting HR  80 bpm     Resting BP  96/54     Resting Oxygen Saturation   100 %     Exercise Oxygen Saturation  during 6 min walk  98 %     Max Ex. HR  101 bpm     Max Ex. BP  130/66     2 Minute Post BP  106/62        Oxygen Initial Assessment:   Oxygen Re-Evaluation:   Oxygen Discharge (Final Oxygen Re-Evaluation):   Initial Exercise Prescription: Initial Exercise Prescription - 07/28/18 1400      Date of Initial Exercise RX and Referring Provider   Date  07/28/18    Referring Provider  Letvak      Treadmill   MPH  2.3    Grade  1    Minutes  15    METs  3      Recumbant Bike   Level  2    RPM  60    Watts  25    Minutes  15    METs  3      NuStep   Level  3    SPM  80    Minutes  15    METs  3      Prescription Details   Frequency (times per week)  3    Duration  Progress to 45 minutes of aerobic exercise without signs/symptoms of physical distress      Intensity   THRR 40-80% of Max Heartrate  109-138    Ratings of Perceived Exertion  11-13    Perceived Dyspnea  0-4      Resistance Training   Training Prescription  Yes    Weight  3 lb    Reps  10-15       Perform Capillary Blood Glucose checks as needed.  Exercise Prescription Changes: Exercise Prescription Changes    Row Name 07/28/18 1400 08/09/18 1200           Response to Exercise   Blood Pressure (Admit)  96/54  122/70      Blood Pressure (Exercise)  130/66  150/70      Blood Pressure (Exit)  106/62  104/58  Heart Rate (Admit)  80 bpm  103 bpm      Heart Rate (Exercise)  101 bpm  122 bpm      Heart Rate (Exit)  85 bpm  108 bpm      Oxygen Saturation (Admit)  100 %  -      Oxygen Saturation (Exit)  98 %  -      Rating of Perceived Exertion (Exercise)  13  12      Symptoms  -  none      Comments  -  2nd day       Duration  -  Continue with 45 min of aerobic exercise without signs/symptoms of physical distress.      Intensity  -  THRR unchanged        Progression   Progression  -  Continue to progress workloads to maintain intensity without signs/symptoms of physical distress.        Resistance Training   Training Prescription  -  Yes      Weight  -  3 lb      Reps  -  10-15        Interval Training   Interval Training  -  No        Treadmill   MPH  -  2.3      Grade  -  1      Minutes  -  15      METs  -  3.08        Recumbant Bike   Level  -  2      RPM  -  60      Watts  -  25      Minutes  -  15      METs  -  3        NuStep   Level  -  3      SPM  -  80      Minutes  -  15      METs  -  2.7         Exercise Comments: Exercise Comments    Row Name 08/03/18 1616           Exercise Comments  First full day of exercise!  Patient was oriented to gym and equipment including functions, settings, policies, and procedures.  Patient's individual exercise prescription and treatment plan were reviewed.  All starting workloads were established based on the results of the 6 minute walk test done at initial orientation visit.  The plan for exercise progression was also introduced and progression will be customized based on patient's performance and goals.          Exercise Goals and Review: Exercise Goals    Row Name 07/28/18 1441             Exercise Goals   Increase Physical Activity  Yes       Intervention  Provide advice, education, support and counseling about physical activity/exercise needs.;Develop an individualized exercise prescription for aerobic and resistive training based on initial evaluation findings, risk stratification, comorbidities and participant's personal goals.       Expected Outcomes  Short Term: Attend rehab on a regular basis to increase amount of physical activity.;Long Term: Add in home exercise to make exercise part of routine and to increase  amount of physical activity.;Long Term: Exercising regularly at least 3-5 days a week.       Increase  Strength and Stamina  Yes       Intervention  Provide advice, education, support and counseling about physical activity/exercise needs.;Develop an individualized exercise prescription for aerobic and resistive training based on initial evaluation findings, risk stratification, comorbidities and participant's personal goals.       Expected Outcomes  Short Term: Increase workloads from initial exercise prescription for resistance, speed, and METs.;Short Term: Perform resistance training exercises routinely during rehab and add in resistance training at home;Long Term: Improve cardiorespiratory fitness, muscular endurance and strength as measured by increased METs and functional capacity (6MWT)       Able to understand and use rate of perceived exertion (RPE) scale  Yes       Intervention  Provide education and explanation on how to use RPE scale       Expected Outcomes  Short Term: Able to use RPE daily in rehab to express subjective intensity level;Long Term:  Able to use RPE to guide intensity level when exercising independently       Able to understand and use Dyspnea scale  Yes       Intervention  Provide education and explanation on how to use Dyspnea scale       Expected Outcomes  Short Term: Able to use Dyspnea scale daily in rehab to express subjective sense of shortness of breath during exertion;Long Term: Able to use Dyspnea scale to guide intensity level when exercising independently       Knowledge and understanding of Target Heart Rate Range (THRR)  Yes       Intervention  Provide education and explanation of THRR including how the numbers were predicted and where they are located for reference       Expected Outcomes  Short Term: Able to state/look up THRR;Short Term: Able to use daily as guideline for intensity in rehab;Long Term: Able to use THRR to govern intensity when exercising  independently       Able to check pulse independently  Yes       Intervention  Provide education and demonstration on how to check pulse in carotid and radial arteries.;Review the importance of being able to check your own pulse for safety during independent exercise       Expected Outcomes  Short Term: Able to explain why pulse checking is important during independent exercise;Long Term: Able to check pulse independently and accurately       Understanding of Exercise Prescription  Yes       Intervention  Provide education, explanation, and written materials on patient's individual exercise prescription       Expected Outcomes  Short Term: Able to explain program exercise prescription;Long Term: Able to explain home exercise prescription to exercise independently          Exercise Goals Re-Evaluation : Exercise Goals Re-Evaluation    Row Name 08/03/18 1616             Exercise Goal Re-Evaluation   Exercise Goals Review  Increase Physical Activity;Able to understand and use rate of perceived exertion (RPE) scale;Knowledge and understanding of Target Heart Rate Range (THRR);Understanding of Exercise Prescription;Increase Strength and Stamina;Able to understand and use Dyspnea scale       Comments  Reviewed RPE scale, THR and program prescription with pt today.  Pt voiced understanding and was given a copy of goals to take home.        Expected Outcomes  Short: Use RPE daily to regulate intensity. Long: Follow program prescription in THR.  Discharge Exercise Prescription (Final Exercise Prescription Changes): Exercise Prescription Changes - 08/09/18 1200      Response to Exercise   Blood Pressure (Admit)  122/70    Blood Pressure (Exercise)  150/70    Blood Pressure (Exit)  104/58    Heart Rate (Admit)  103 bpm    Heart Rate (Exercise)  122 bpm    Heart Rate (Exit)  108 bpm    Rating of Perceived Exertion (Exercise)  12    Symptoms  none    Comments  2nd day    Duration   Continue with 45 min of aerobic exercise without signs/symptoms of physical distress.    Intensity  THRR unchanged      Progression   Progression  Continue to progress workloads to maintain intensity without signs/symptoms of physical distress.      Resistance Training   Training Prescription  Yes    Weight  3 lb    Reps  10-15      Interval Training   Interval Training  No      Treadmill   MPH  2.3    Grade  1    Minutes  15    METs  3.08      Recumbant Bike   Level  2    RPM  60    Watts  25    Minutes  15    METs  3      NuStep   Level  3    SPM  80    Minutes  15    METs  2.7       Nutrition:  Target Goals: Understanding of nutrition guidelines, daily intake of sodium <1545m, cholesterol <2066m calories 30% from fat and 7% or less from saturated fats, daily to have 5 or more servings of fruits and vegetables.  Biometrics: Pre Biometrics - 07/28/18 1441      Pre Biometrics   Height  5' 11" (1.803 m)    Weight  172 lb 8 oz (78.2 kg)    Waist Circumference  39 inches    Hip Circumference  38 inches    Waist to Hip Ratio  1.03 %    BMI (Calculated)  24.07    Single Leg Stand  30 seconds        Nutrition Therapy Plan and Nutrition Goals: Nutrition Therapy & Goals - 07/28/18 1412      Intervention Plan   Intervention  Prescribe, educate and counsel regarding individualized specific dietary modifications aiming towards targeted core components such as weight, hypertension, lipid management, diabetes, heart failure and other comorbidities.    Expected Outcomes  Short Term Goal: Understand basic principles of dietary content, such as calories, fat, sodium, cholesterol and nutrients.;Short Term Goal: A plan has been developed with personal nutrition goals set during dietitian appointment.;Long Term Goal: Adherence to prescribed nutrition plan.       Nutrition Assessments: Nutrition Assessments - 07/28/18 1412      MEDFICTS Scores   Pre Score  30        Nutrition Goals Re-Evaluation:   Nutrition Goals Discharge (Final Nutrition Goals Re-Evaluation):   Psychosocial: Target Goals: Acknowledge presence or absence of significant depression and/or stress, maximize coping skills, provide positive support system. Participant is able to verbalize types and ability to use techniques and skills needed for reducing stress and depression.   Initial Review & Psychosocial Screening: Initial Psych Review & Screening - 07/28/18 1409      Initial  Review   Current issues with  Current Sleep Concerns;Current Stress Concerns    Source of Stress Concerns  Unable to perform yard/household activities    Comments  -Surgery done in [place], then several surgeries Dec for infection. Still has 10 pound weight limit for lifting.      Family Dynamics   Good Support System?  Yes   Sisters and son   Son lives out of state     Barriers   Psychosocial barriers to participate in program  There are no identifiable barriers or psychosocial needs.;The patient should benefit from training in stress management and relaxation.      Screening Interventions   Interventions  Encouraged to exercise;To provide support and resources with identified psychosocial needs;Provide feedback about the scores to participant    Expected Outcomes  Short Term goal: Utilizing psychosocial counselor, staff and physician to assist with identification of specific Stressors or current issues interfering with healing process. Setting desired goal for each stressor or current issue identified.;Long Term Goal: Stressors or current issues are controlled or eliminated.;Short Term goal: Identification and review with participant of any Quality of Life or Depression concerns found by scoring the questionnaire.;Long Term goal: The participant improves quality of Life and PHQ9 Scores as seen by post scores and/or verbalization of changes       Quality of Life Scores:  Quality of Life - 07/28/18  1412      Quality of Life   Select  Quality of Life      Quality of Life Scores   Health/Function Pre  13.47 %    Socioeconomic Pre  20.07 %    Psych/Spiritual Pre  13.86 %    Family Pre  19.5 %    GLOBAL Pre  15.79 %      Scores of 19 and below usually indicate a poorer quality of life in these areas.  A difference of  2-3 points is a clinically meaningful difference.  A difference of 2-3 points in the total score of the Quality of Life Index has been associated with significant improvement in overall quality of life, self-image, physical symptoms, and general health in studies assessing change in quality of life.  PHQ-9: Recent Review Flowsheet Data    Depression screen [facility] 2/9 08/10/2018 07/28/2018 07/12/2018 03/30/2018 01/03/2018   Decreased Interest 0 1 0 1 0   Down, Depressed, Hopeless 0 1 0 1 1   PHQ - 2 Score 0 2 0 2 1   Altered sleeping - 3 - 1 -   Tired, decreased energy - 2 - 1 -   Change in appetite - 1 - 1 -   Feeling bad or failure about yourself  - 1 - 1 -   Trouble concentrating - 0 - 1 -   Moving slowly or fidgety/restless - 0 - 1 -   Suicidal thoughts - 0 - 1 -   PHQ-9 Score - 9 - 9 -   Difficult doing work/chores - Somewhat difficult - - -     Interpretation of Total Score  Total Score Depression Severity:  1-4 = Minimal depression, 5-9 = Mild depression, 10-14 = Moderate depression, 15-19 = Moderately severe depression, 20-27 = Severe depression   Psychosocial Evaluation and Intervention: Psychosocial Evaluation - 08/15/18 1058      Psychosocial Evaluation & Interventions   Interventions  Encouraged to exercise with the program and follow exercise prescription    Comments  Counselor contacted patient to conduct initial psychological assessment.  Patient shared thoughts and feelings related to multiple surgeries he had between November and December 2019.  He reported that he has been on antibiotics since first surgery and the medication has impacted how he is  able to eat as the extended use of antibiotics has led to candida.  Patient does have several other comorbidities which he feels are well managed.  Sleep is an issue and takes trazadone each night.  He reports that sleep has improved in the last month.  No stressors to report at present.  Patient stated that he felt some depression after the infections which led to December surgeries but feels that has lifted.  Noted some anxiety but self-manges with positive outlook..  Patient likes to travel, has family in [place] and in [place].    Expected Outcomes  Short term goal: doing videos every other day, in-home exercise machine daily; long-term goal: get back to how he felt before surgeries, be able to travel and maintain lawn    Continue Psychosocial Services   Follow up required by staff       Psychosocial Re-Evaluation:   Psychosocial Discharge (Final Psychosocial Re-Evaluation):   Vocational Rehabilitation: Provide vocational rehab assistance to qualifying candidates.   Vocational Rehab Evaluation & Intervention: Vocational Rehab - 07/28/18 1414      Initial Vocational Rehab Evaluation & Intervention   Assessment shows need for Vocational Rehabilitation  No       Education: Education Goals: Education classes will be provided on a variety of topics geared toward better understanding of heart health and risk factor modification. Participant will state understanding/return demonstration of topics presented as noted by education test scores.  Learning Barriers/Preferences: Learning Barriers/Preferences - 07/28/18 1412      Learning Barriers/Preferences   Learning Barriers  None    Learning Preferences  Individual Instruction;Group Instruction;Verbal Instruction;Skilled Demonstration       Education Topics:  AED/CPR: - Group verbal and written instruction with the use of models to demonstrate the basic use of the AED with the basic ABC's of resuscitation.   General Nutrition  Guidelines/Fats and Fiber: -Group instruction provided by verbal, written material, models and posters to present the general guidelines for heart healthy nutrition. Gives an explanation and review of dietary fats and fiber.   Controlling Sodium/Reading Food Labels: -Group verbal and written material supporting the discussion of sodium use in heart healthy nutrition. Review and explanation with models, verbal and written materials for utilization of the food label.   Cardiac Rehab from 08/03/2018 in [facility] Cardiac and Pulmonary Rehab  Date  08/03/18  Educator  [facility]  Instruction Review Code  1- Verbalizes Understanding      Exercise Physiology & General Exercise Guidelines: - Group verbal and written instruction with models to review the exercise physiology of the cardiovascular system and associated critical values. Provides general exercise guidelines with specific guidelines to those with heart or lung disease.    Aerobic Exercise & Resistance Training: - Gives group verbal and written instruction on the various components of exercise. Focuses on aerobic and resistive training programs and the benefits of this training and how to safely progress through these programs..   Flexibility, Balance, Mind/Body Relaxation: Provides group verbal/written instruction on the benefits of flexibility and balance training, including mind/body exercise modes such as yoga, pilates and tai chi.  Demonstration and skill practice provided.   Stress and Anxiety: - Provides group verbal and written instruction about the health risks of elevated stress and causes of high stress.  Discuss the correlation between heart/lung disease and anxiety and treatment options. Review healthy ways to manage with stress and anxiety.   Depression: - Provides group verbal and written instruction on the correlation between heart/lung disease and depressed mood, treatment options, and the stigmas associated with seeking  treatment.   Anatomy & Physiology of the Heart: - Group verbal and written instruction and models provide basic cardiac anatomy and physiology, with the coronary electrical and arterial systems. Review of Valvular disease and Heart Failure   Cardiac Procedures: - Group verbal and written instruction to review commonly prescribed medications for heart disease. Reviews the medication, class of the drug, and side effects. Includes the steps to properly store meds and maintain the prescription regimen. (beta blockers and nitrates)   Cardiac Medications I: - Group verbal and written instruction to review commonly prescribed medications for heart disease. Reviews the medication, class of the drug, and side effects. Includes the steps to properly store meds and maintain the prescription regimen.   Cardiac Medications II: -Group verbal and written instruction to review commonly prescribed medications for heart disease. Reviews the medication, class of the drug, and side effects. (all other drug classes)    Go Sex-Intimacy & Heart Disease, Get SMART - Goal Setting: - Group verbal and written instruction through game format to discuss heart disease and the return to sexual intimacy. Provides group verbal and written material to discuss and apply goal setting through the application of the S.M.A.R.T. Method.   Other Matters of the Heart: - Provides group verbal, written materials and models to describe Stable Angina and Peripheral Artery. Includes description of the disease process and treatment options available to the cardiac patient.   Exercise & Equipment Safety: - Individual verbal instruction and demonstration of equipment use and safety with use of the equipment.   Cardiac Rehab from 08/03/2018 in [facility] Cardiac and Pulmonary Rehab  Date  07/28/18  Educator  mc  Instruction Review Code  1- Verbalizes Understanding      Infection Prevention: - Provides verbal and written material to  individual with discussion of infection control including proper hand washing and proper equipment cleaning during exercise session.   Cardiac Rehab from 08/03/2018 in [facility] Cardiac and Pulmonary Rehab  Date  07/28/18  Educator  mc  Instruction Review Code  1- Verbalizes Understanding      Falls Prevention: - Provides verbal and written material to individual with discussion of falls prevention and safety.   Cardiac Rehab from 08/03/2018 in [facility] Cardiac and Pulmonary Rehab  Date  07/28/18  Educator  mc  Instruction Review Code  1- Verbalizes Understanding      Diabetes: - Individual verbal and written instruction to review signs/symptoms of diabetes, desired ranges of glucose level fasting, after meals and with exercise. Acknowledge that pre and post exercise glucose checks will be done for 3 sessions at entry of program.   Know Your Numbers and Risk Factors: -Group verbal and written instruction about important numbers in your health.  Discussion of what are risk factors and how they play a role in the disease process.  Review of Cholesterol, Blood Pressure, Diabetes, and BMI and the role they play in your overall health.   Sleep Hygiene: -Provides group verbal and written instruction about how sleep can affect your health.  Define sleep hygiene, discuss sleep cycles and impact of sleep habits. Review good sleep hygiene tips.    Other: -Provides group and verbal instruction on various topics (see comments)   Knowledge  Questionnaire Score: Knowledge Questionnaire Score - 07/28/18 1413      Knowledge Questionnaire Score   Pre Score  23/26   Correct responses were reviewed with Bill today. He verbalized understanding of the responses.       Core Components/Risk Factors/Patient Goals at Admission: Personal Goals and Risk Factors at Admission - 07/28/18 1414      Core Components/Risk Factors/Patient Goals on Admission    Weight Management  Yes;Weight Maintenance     Intervention  Weight Management: Develop a combined nutrition and exercise program designed to reach desired caloric intake, while maintaining appropriate intake of nutrient and fiber, sodium and fats, and appropriate energy expenditure required for the weight goal.;Weight Management: Provide education and appropriate resources to help participant work on and attain dietary goals.   Lost 15 pounds since surgery. Wants to keep weight where he is now  172.5 lbs   Expected Outcomes  Short Term: Continue to assess and modify interventions until short term weight is achieved;Long Term: Adherence to nutrition and physical activity/exercise program aimed toward attainment of established weight goal    Improve shortness of breath with ADL's  Yes    Intervention  Provide education, individualized exercise plan and daily activity instruction to help decrease symptoms of SOB with activities of daily living.    Expected Outcomes  Short Term: Improve cardiorespiratory fitness to achieve a reduction of symptoms when performing ADLs;Long Term: Be able to perform more ADLs without symptoms or delay the onset of symptoms    Hypertension  Yes    Intervention  Provide education on lifestyle modifcations including regular physical activity/exercise, weight management, moderate sodium restriction and increased consumption of fresh fruit, vegetables, and low fat dairy, alcohol moderation, and smoking cessation.;Monitor prescription use compliance.    Expected Outcomes  Short Term: Continued assessment and intervention until BP is < 140/75m HG in hypertensive participants. < 130/884mHG in hypertensive participants with diabetes, heart failure or chronic kidney disease.;Long Term: Maintenance of blood pressure at goal levels.    Lipids  Yes    Intervention  Provide education and support for participant on nutrition & aerobic/resistive exercise along with prescribed medications to achieve LDL <7026mHDL >25m45m  Expected  Outcomes  Short Term: Participant states understanding of desired cholesterol values and is compliant with medications prescribed. Participant is following exercise prescription and nutrition guidelines.;Long Term: Cholesterol controlled with medications as prescribed, with individualized exercise RX and with personalized nutrition plan. Value goals: LDL < 70mg20mL > 40 mg.       Core Components/Risk Factors/Patient Goals Review:    Core Components/Risk Factors/Patient Goals at Discharge (Final Review):    ITP Comments: ITP Comments    Row Name 07/28/18 1335 08/12/18 0811 08/17/18 1327       ITP Comments  Medical review and evaluation completed today. ITP sent to Dr MilleSabra Heckreview,changes as needed and signature. Documentation of the diagnosis can be found in  [facility]/2019  Our program is currently closed due to COVID-19.  We are communicating with patient via phone calls and emails.   30 day review completed. Continue with ITP unless directed changes by Medical Director at review.        Comments:

## 2018-09-01 MED ORDER — TRAZODONE HCL 50 MG PO TABS: 50.0000 mg | ORAL_TABLET | Freq: Every day | ORAL | 11 refills | Status: DC

## 2018-09-02 DIAGNOSIS — R112 Nausea with vomiting, unspecified: Secondary | ICD-10-CM | POA: Insufficient documentation

## 2018-09-05 DIAGNOSIS — Z8614 Personal history of Methicillin resistant Staphylococcus aureus infection: Secondary | ICD-10-CM | POA: Diagnosis not present

## 2018-09-05 DIAGNOSIS — G4700 Insomnia, unspecified: Secondary | ICD-10-CM | POA: Diagnosis not present

## 2018-09-05 DIAGNOSIS — Z888 Allergy status to other drugs, medicaments and biological substances status: Secondary | ICD-10-CM | POA: Diagnosis not present

## 2018-09-05 DIAGNOSIS — E785 Hyperlipidemia, unspecified: Secondary | ICD-10-CM | POA: Diagnosis not present

## 2018-09-05 DIAGNOSIS — E782 Mixed hyperlipidemia: Secondary | ICD-10-CM | POA: Diagnosis not present

## 2018-09-05 DIAGNOSIS — Z7901 Long term (current) use of anticoagulants: Secondary | ICD-10-CM | POA: Diagnosis not present

## 2018-09-05 DIAGNOSIS — Z952 Presence of prosthetic heart valve: Secondary | ICD-10-CM | POA: Diagnosis not present

## 2018-09-05 DIAGNOSIS — I5022 Chronic systolic (congestive) heart failure: Secondary | ICD-10-CM | POA: Diagnosis not present

## 2018-09-05 DIAGNOSIS — Z951 Presence of aortocoronary bypass graft: Secondary | ICD-10-CM | POA: Diagnosis not present

## 2018-09-05 DIAGNOSIS — Z7982 Long term (current) use of aspirin: Secondary | ICD-10-CM | POA: Diagnosis not present

## 2018-09-05 DIAGNOSIS — Z87891 Personal history of nicotine dependence: Secondary | ICD-10-CM | POA: Diagnosis not present

## 2018-09-05 DIAGNOSIS — I2510 Atherosclerotic heart disease of native coronary artery without angina pectoris: Secondary | ICD-10-CM | POA: Diagnosis not present

## 2018-09-05 DIAGNOSIS — D649 Anemia, unspecified: Secondary | ICD-10-CM | POA: Diagnosis not present

## 2018-09-05 DIAGNOSIS — Z79899 Other long term (current) drug therapy: Secondary | ICD-10-CM | POA: Diagnosis not present

## 2018-09-05 DIAGNOSIS — J9811 Atelectasis: Secondary | ICD-10-CM | POA: Diagnosis not present

## 2018-09-05 DIAGNOSIS — L7634 Postprocedural seroma of skin and subcutaneous tissue following other procedure: Secondary | ICD-10-CM | POA: Diagnosis not present

## 2018-09-05 DIAGNOSIS — I110 Hypertensive heart disease with heart failure: Secondary | ICD-10-CM | POA: Diagnosis not present

## 2018-09-05 DIAGNOSIS — T8149XA Infection following a procedure, other surgical site, initial encounter: Secondary | ICD-10-CM | POA: Diagnosis not present

## 2018-09-05 DIAGNOSIS — I480 Paroxysmal atrial fibrillation: Secondary | ICD-10-CM | POA: Diagnosis not present

## 2018-09-05 DIAGNOSIS — T8141XA Infection following a procedure, superficial incisional surgical site, initial encounter: Secondary | ICD-10-CM | POA: Diagnosis not present

## 2018-09-05 DIAGNOSIS — R918 Other nonspecific abnormal finding of lung field: Secondary | ICD-10-CM | POA: Diagnosis not present

## 2018-09-05 DIAGNOSIS — Z1159 Encounter for screening for other viral diseases: Secondary | ICD-10-CM | POA: Diagnosis not present

## 2018-09-05 DIAGNOSIS — J439 Emphysema, unspecified: Secondary | ICD-10-CM | POA: Diagnosis not present

## 2018-09-07 MED ORDER — OXYCODONE HCL 5 MG PO TABS
5.00 | ORAL_TABLET | ORAL | Status: DC
Start: ? — End: 2018-09-07

## 2018-09-07 MED ORDER — SENNOSIDES-DOCUSATE SODIUM 8.6-50 MG PO TABS
2.00 | ORAL_TABLET | ORAL | Status: DC
Start: 2018-09-07 — End: 2018-09-07

## 2018-09-07 MED ORDER — GENERIC EXTERNAL MEDICATION
1.25 | Status: DC
Start: 2018-09-07 — End: 2018-09-07

## 2018-09-07 MED ORDER — ACETAMINOPHEN 325 MG PO TABS
650.00 | ORAL_TABLET | ORAL | Status: DC
Start: 2018-09-07 — End: 2018-09-07

## 2018-09-07 MED ORDER — DOLUTEGRAVIR SODIUM 50 MG PO TABS
50.00 | ORAL_TABLET | ORAL | Status: DC
Start: 2018-09-08 — End: 2018-09-07

## 2018-09-07 MED ORDER — AMLODIPINE BESYLATE 5 MG PO TABS
5.00 | ORAL_TABLET | ORAL | Status: DC
Start: 2018-09-07 — End: 2018-09-07

## 2018-09-07 MED ORDER — MELATONIN 3 MG PO TBDP
6.00 | ORAL_TABLET | ORAL | Status: DC
Start: 2018-09-07 — End: 2018-09-07

## 2018-09-07 MED ORDER — POLYETHYLENE GLYCOL 3350 17 G PO PACK
17.00 | PACK | ORAL | Status: DC
Start: ? — End: 2018-09-07

## 2018-09-07 MED ORDER — EMTRICITABINE-TENOFOVIR AF 200-25 MG PO TABS
1.00 | ORAL_TABLET | ORAL | Status: DC
Start: 2018-09-08 — End: 2018-09-07

## 2018-09-07 MED ORDER — ATORVASTATIN CALCIUM 20 MG PO TABS
20.00 | ORAL_TABLET | ORAL | Status: DC
Start: 2018-09-07 — End: 2018-09-07

## 2018-09-07 MED ORDER — GENERIC EXTERNAL MEDICATION
Status: DC
Start: ? — End: 2018-09-07

## 2018-09-07 MED ORDER — GENERIC EXTERNAL MEDICATION
3.38 | Status: DC
Start: 2018-09-07 — End: 2018-09-07

## 2018-09-07 MED ORDER — ASPIRIN EC 81 MG PO TBEC
81.00 | DELAYED_RELEASE_TABLET | ORAL | Status: DC
Start: 2018-09-08 — End: 2018-09-07

## 2018-09-07 MED ORDER — PANTOPRAZOLE SODIUM 40 MG PO TBEC
40.00 | DELAYED_RELEASE_TABLET | ORAL | Status: DC
Start: 2018-09-08 — End: 2018-09-07

## 2018-09-07 MED ORDER — TRAZODONE HCL 50 MG PO TABS
25.00 | ORAL_TABLET | ORAL | Status: DC
Start: ? — End: 2018-09-07

## 2018-09-07 MED ORDER — TUSSI PRES-B 2-15-200 MG/5ML PO LIQD
0.50 | ORAL | Status: DC
Start: ? — End: 2018-09-07

## 2018-09-07 MED ORDER — ONDANSETRON HCL 4 MG/2ML IJ SOLN
4.00 | INTRAMUSCULAR | Status: DC
Start: ? — End: 2018-09-07

## 2018-09-12 DIAGNOSIS — Z9889 Other specified postprocedural states: Secondary | ICD-10-CM | POA: Insufficient documentation

## 2018-09-19 DIAGNOSIS — R9431 Abnormal electrocardiogram [ECG] [EKG]: Secondary | ICD-10-CM | POA: Diagnosis not present

## 2018-09-19 DIAGNOSIS — I10 Essential (primary) hypertension: Secondary | ICD-10-CM | POA: Diagnosis not present

## 2018-09-19 DIAGNOSIS — Z79899 Other long term (current) drug therapy: Secondary | ICD-10-CM | POA: Diagnosis not present

## 2018-09-19 DIAGNOSIS — I4891 Unspecified atrial fibrillation: Secondary | ICD-10-CM | POA: Diagnosis not present

## 2018-09-19 DIAGNOSIS — R000 Tachycardia, unspecified: Secondary | ICD-10-CM | POA: Diagnosis not present

## 2018-09-19 DIAGNOSIS — I4589 Other specified conduction disorders: Secondary | ICD-10-CM | POA: Diagnosis not present

## 2018-09-19 DIAGNOSIS — I119 Hypertensive heart disease without heart failure: Secondary | ICD-10-CM | POA: Diagnosis not present

## 2018-09-19 DIAGNOSIS — Z951 Presence of aortocoronary bypass graft: Secondary | ICD-10-CM | POA: Diagnosis not present

## 2018-09-19 DIAGNOSIS — Z48817 Encounter for surgical aftercare following surgery on the skin and subcutaneous tissue: Secondary | ICD-10-CM | POA: Diagnosis not present

## 2018-09-23 ENCOUNTER — Telehealth: Payer: Self-pay

## 2018-09-23 DIAGNOSIS — Z952 Presence of prosthetic heart valve: Secondary | ICD-10-CM | POA: Diagnosis not present

## 2018-09-23 DIAGNOSIS — Z8679 Personal history of other diseases of the circulatory system: Secondary | ICD-10-CM | POA: Diagnosis not present

## 2018-09-23 DIAGNOSIS — I081 Rheumatic disorders of both mitral and tricuspid valves: Secondary | ICD-10-CM | POA: Diagnosis not present

## 2018-09-23 DIAGNOSIS — I2510 Atherosclerotic heart disease of native coronary artery without angina pectoris: Secondary | ICD-10-CM | POA: Diagnosis not present

## 2018-09-23 DIAGNOSIS — Z951 Presence of aortocoronary bypass graft: Secondary | ICD-10-CM | POA: Diagnosis not present

## 2018-09-23 DIAGNOSIS — I517 Cardiomegaly: Secondary | ICD-10-CM | POA: Diagnosis not present

## 2018-09-23 DIAGNOSIS — Z87891 Personal history of nicotine dependence: Secondary | ICD-10-CM | POA: Diagnosis not present

## 2018-09-23 DIAGNOSIS — I5022 Chronic systolic (congestive) heart failure: Secondary | ICD-10-CM | POA: Diagnosis not present

## 2018-09-23 DIAGNOSIS — Z79899 Other long term (current) drug therapy: Secondary | ICD-10-CM | POA: Diagnosis not present

## 2018-09-23 DIAGNOSIS — I10 Essential (primary) hypertension: Secondary | ICD-10-CM | POA: Diagnosis not present

## 2018-09-23 DIAGNOSIS — I340 Nonrheumatic mitral (valve) insufficiency: Secondary | ICD-10-CM | POA: Diagnosis not present

## 2018-09-23 DIAGNOSIS — I110 Hypertensive heart disease with heart failure: Secondary | ICD-10-CM | POA: Diagnosis not present

## 2018-09-23 NOTE — Telephone Encounter (Signed)
Dr Silvio Pate asked that I call and check on him after recent Hospital stay in mid-April.

## 2018-09-23 NOTE — Telephone Encounter (Signed)
Patient returned call from the office.  Patient stated he is doing fine, he had an appointment with his Cardiologist today and they change his medication. He stated everything is doing great

## 2018-09-25 ENCOUNTER — Other Ambulatory Visit: Payer: Self-pay | Admitting: Internal Medicine

## 2018-09-26 NOTE — Progress Notes (Signed)
LMOM

## 2018-09-27 NOTE — Progress Notes (Signed)
lmom 

## 2018-09-30 ENCOUNTER — Other Ambulatory Visit: Payer: Self-pay | Admitting: Family

## 2018-09-30 DIAGNOSIS — B9562 Methicillin resistant Staphylococcus aureus infection as the cause of diseases classified elsewhere: Secondary | ICD-10-CM

## 2018-09-30 DIAGNOSIS — R7881 Bacteremia: Secondary | ICD-10-CM

## 2018-10-10 NOTE — Progress Notes (Signed)
LMOM

## 2018-10-12 ENCOUNTER — Other Ambulatory Visit: Payer: Self-pay | Admitting: Family

## 2018-10-12 DIAGNOSIS — B20 Human immunodeficiency virus [HIV] disease: Secondary | ICD-10-CM

## 2018-10-13 NOTE — Progress Notes (Signed)
lmom 

## 2018-10-21 DIAGNOSIS — Z953 Presence of xenogenic heart valve: Secondary | ICD-10-CM | POA: Diagnosis not present

## 2018-10-21 DIAGNOSIS — J9851 Mediastinitis: Secondary | ICD-10-CM | POA: Diagnosis not present

## 2018-10-21 DIAGNOSIS — I5022 Chronic systolic (congestive) heart failure: Secondary | ICD-10-CM | POA: Diagnosis not present

## 2018-10-21 DIAGNOSIS — I2510 Atherosclerotic heart disease of native coronary artery without angina pectoris: Secondary | ICD-10-CM | POA: Diagnosis not present

## 2018-10-21 DIAGNOSIS — I340 Nonrheumatic mitral (valve) insufficiency: Secondary | ICD-10-CM | POA: Diagnosis not present

## 2018-11-02 ENCOUNTER — Other Ambulatory Visit: Payer: Self-pay | Admitting: Infectious Disease

## 2018-11-02 DIAGNOSIS — B009 Herpesviral infection, unspecified: Secondary | ICD-10-CM

## 2018-11-16 ENCOUNTER — Encounter (HOSPITAL_COMMUNITY): Payer: Self-pay | Admitting: Emergency Medicine

## 2018-11-16 ENCOUNTER — Other Ambulatory Visit: Payer: Self-pay

## 2018-11-16 ENCOUNTER — Telehealth: Payer: Self-pay | Admitting: *Deleted

## 2018-11-16 ENCOUNTER — Ambulatory Visit (HOSPITAL_COMMUNITY)
Admission: EM | Admit: 2018-11-16 | Discharge: 2018-11-16 | Disposition: A | Discharge status: Home / Self Care | Payer: Medicare Other | Attending: Family Medicine | Admitting: Family Medicine

## 2018-11-16 DIAGNOSIS — R0781 Pleurodynia: Secondary | ICD-10-CM | POA: Diagnosis not present

## 2018-11-16 NOTE — Telephone Encounter (Signed)
Patient notified as instructed by telephone and verbalized understanding. Patient stated that he will go to [facility] as recommenced.

## 2018-11-16 NOTE — Telephone Encounter (Signed)
Patient called stating that he had heart surgery back in November and had to have multiple surgeries because he developed an infection. Patient stated that he started about 4 days ago with pain in his chest/stomach at the bottom of his ribcage. Patient stated that it hurts more when he moves a certain way or if he coughs. Patient stated that he was a smoker for 40 years and does have a smokers cough. Patient stated that he does have a productive cough in the morning when he wakes up, but that clears up after he takes his diuretic. Patient started he is wondering if he has some scar tissue. Patient stated that his blood pressure is running good, but his pulse has been a 100 since his heart surgery. Patient stated that his cardiologist is in [place] and is Psychologist, sport and exercise is at [employer]. Patient wants to know what Dr. Silvio Pate recommends that he do?

## 2018-11-16 NOTE — ED Triage Notes (Signed)
onst 3-4 days ago, thought to be a pulled muscle.  Noticed after pulling grass in garden.  If he lies on back, it does not hurt.  Lying on sides is painful.  also "hurts tremendously with cough"  Feels like a "burning ball"

## 2018-11-16 NOTE — Discharge Instructions (Signed)
Use ice/heat as needed for additional pain relief. Can massage for additional relief. Return if you develop weakness, dizziness, chest pain, difficulty breathing.

## 2018-11-16 NOTE — Telephone Encounter (Signed)
Okay I will await his evaluation there

## 2018-11-16 NOTE — ED Provider Notes (Signed)
[place]    CSN: [id number] Arrival date & time: 11/16/18  1407     History   Chief Complaint Chief Complaint  Patient presents with  . Abdominal Pain    HPI STEPHANIE MCGLONE is a 68 y.o. male history of hypertension, HIV, mitral regurg status post valve replacement on chronic anticoagulation, chronic systolic heart failure followed by cardiology at [facility] presenting for right subcostal pain.  Patient states that he finished a course of doxycycline 2 weeks ago for mediastinitis status post open heart surgery greater than in December 2019.  Patient has been doing home cardiac rehab due to concern for coronavirus exposure and office visits already in this well.  Patient states that after finishing antibiotics a few weeks ago he has been more active around the house.  States last week he was pulling it "a bunch of weeds all over the yard "and has since developed this "catching sensation "; of note, patient denies dyspnea with this exertion, wheezing, chest pain, lightheadedness.  Patient states that pain is nonradiating, worse with cough and "certain movements ".  Patient denies pain exacerbation with leaning forward or laying down.  Patient states that sometimes it feels like a deep burning or a knot is there.  Patient denies chest pain, shortness of breath reflux, nausea, vomiting, abdominal pain.  No change in urinary frequency or bowel habits.  Patient does endorse a chronic, morning cough that is without hemoptysis or shortness of breath.  Patient states this is remained unchanged.  Patient states that he was going to be evaluated by his PCP, though due to cough it was advised that he go to an urgent care facility.  Patient denies recent sick contacts, contact with known COVID positive persons, myalgias, malaise, trauma to the area of concern, lower extremity edema.   Past Medical History:  Diagnosis Date  . Anxiety   . Anxiety 03/29/2017  . Heart murmur   . Hepatitis    B  .  HIV (human immunodeficiency virus infection) ([place])   . Hyperlipidemia   . Hypertension   . Palpitations     Patient Active Problem List   Diagnosis Date Noted  . Mediastinitis 07/12/2018  . MRSA bacteremia 07/12/2018  . 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 Paroxysmal atrial fibrillation ([place]) 05/05/2018  . Chronic systolic heart failure ([place]) 05/05/2018  . Mitral regurgitation 01/03/2018  . Advance directive discussed with patient 01/03/2018  . Folliculitis 32/67/1245  . Anxiety 03/29/2017  . Episodic mood disorder ([place]) 09/24/2014  . Routine general medical examination at a health care facility 07/25/2012  . SMOKER 06/04/2010  . Insomnia 02/04/2009  . Chronic hepatitis B virus infection ([place]) 12/03/2008  . Human immunodeficiency virus (HIV) disease ([place]) 11/21/2008  . Hyperlipemia 10/28/2007  . Essential hypertension 10/28/2007  . ALLERGIC RHINITIS 10/28/2007  . ACTINIC KERATOSIS 10/28/2007    Past Surgical History:  Procedure Laterality Date  . CATARACT EXTRACTION W/PHACO Left 11/11/2017   Procedure: CATARACT EXTRACTION PHACO AND INTRAOCULAR LENS PLACEMENT (IOC);  Surgeon: Birder Robson, MD;  Location: [facility] ORS;  Service: Ophthalmology;  Laterality: Left;  [place] 00:59.3 AP% 19.5 CDE 11.54 Fluid pack lot # [id number]  . CATARACT EXTRACTION W/PHACO Right 12/21/2017   Procedure: CATARACT EXTRACTION PHACO AND INTRAOCULAR LENS PLACEMENT (IOC);  Surgeon: Birder Robson, MD;  Location: [facility] ORS;  Service: Ophthalmology;  Laterality: Right;  [place] 00:40 AP% 13.5 CDE 5.50 Fluid pack lot $ [id number]  . COLONOSCOPY    . COLONOSCOPY WITH PROPOFOL N/A  02/08/2018   Procedure: COLONOSCOPY WITH PROPOFOL;  Surgeon: Jonathon Bellows, MD;  Location: [facility] ENDOSCOPY;  Service: Gastroenterology;  Laterality: N/A;  . CORONARY ARTERY BYPASS GRAFT  04/07/2018  . EYE SURGERY     RETINA  . HERNIA REPAIR    . MITRAL VALVE REPLACEMENT  03/2018   bovine--with CABG       Home Medications     Prior to Admission medications   Medication Sig Start Date End Date Taking? Authorizing Provider  aspirin EC 81 MG tablet Take 81 mg by mouth daily.   Yes [provider]  atorvastatin (LIPITOR) 20 MG tablet TAKE 1 TABLET BY MOUTH  DAILY 04/11/18  Yes [name], MD  DESCOVY 200-25 MG tablet TAKE 1 TABLET BY MOUTH DAILY 10/12/18  Yes Golden Circle, FNP  diphenhydrAMINE (BENADRYL) 25 mg capsule Take by mouth.    [provider]  doxycycline (VIBRA-TABS) 100 MG tablet TAKE 1 TABLET BY MOUTH TWICE A DAY 09/30/18   Golden Circle, FNP  lisinopril (PRINIVIL,ZESTRIL) 5 MG tablet Take 1 tablet by mouth daily. 04/13/18   [provider]  Melatonin 5 MG TABS Take 5 mg by mouth at bedtime.     [provider]  metoprolol succinate (TOPROL-XL) 25 MG 24 hr tablet Take 1 tablet by mouth daily. 05/01/18 05/01/19  [provider]  Multiple Vitamins-Minerals (MULTIVITAMIN ADULT PO) Take by mouth.    [provider]  nystatin (MYCOSTATIN) 100000 UNIT/ML suspension SWISH AND SWALLOW 5 MLS 4 (FOUR) TIMES DAILY FOR 10 DAYS 06/03/18   [provider]  rivaroxaban (XARELTO) 20 MG TABS tablet Take by mouth. 06/16/18   [provider]  sulfamethoxazole-trimethoprim (BACTRIM DS,SEPTRA DS) 800-160 MG tablet Take 1 tablet by mouth 2 (two) times daily. 07/15/18   Golden Circle, FNP  TIVICAY 50 MG tablet TAKE 1 TABLET(50 MG) BY MOUTH DAILY 10/12/18   Golden Circle, FNP  torsemide (DEMADEX) 20 MG tablet Take 1 tablet by mouth daily.  05/01/18 05/01/19  [provider]  traZODone (DESYREL) 50 MG tablet TAKE 1-2 TABLETS (50-100 MG TOTAL) BY MOUTH AT BEDTIME. 09/26/18 09/26/19  [name], MD  valACYclovir (VALTREX) 500 MG tablet Take 1 tablet (500 mg total) by mouth daily as needed (for fever blisters.). 11/02/18   Truman Hayward, MD    Family History History reviewed. No pertinent family history.  Social History Social  History   Tobacco Use  . Smoking status: Former Smoker    Packs/day: 2.00    Years: 40.00    Pack years: 80.00    Types: Cigarettes  . Smokeless tobacco: Never Used  . Tobacco comment: Quit 05/25/2017  Substance Use Topics  . Alcohol use: No    Alcohol/week: 0.0 standard drinks  . Drug use: No     Allergies   Magnesium   Review of Systems As per HPI   Physical Exam Triage Vital Signs ED Triage Vitals  Enc Vitals Group     BP 11/16/18 1454 121/72     Pulse Rate 11/16/18 1454 (!) 109     Resp --      Temp 11/16/18 1454 98.3 F (36.8 C)     Temp Source 11/16/18 1454 Oral     SpO2 --      Weight --      Height --      Head Circumference --      Peak Flow --      Pain Score  11/16/18 1500 2     Pain Loc --      Pain Edu? --      Excl. in [place]? --    No data found.  Updated Vital Signs BP 121/72 (BP Location: Left Arm)   Pulse (!) 109 Comment: reported HR to Nurse Kim Lapan-Hutchens  Temp 98.3 F (36.8 C) (Oral)   Visual Acuity Right Eye Distance:   Left Eye Distance:   Bilateral Distance:    Right Eye Near:   Left Eye Near:    Bilateral Near:     Physical Exam   UC Treatments / Results  Labs (all labs ordered are listed, but only abnormal results are displayed) Labs Reviewed - No data to display  EKG None  Radiology No results found.  Procedures Procedures (including critical care time)  Medications Ordered in UC Medications - No data to display  Initial Impression / Assessment and Plan / UC Course  I have reviewed the triage vital signs and the nursing notes.  Pertinent labs & imaging results that were available during my care of the patient were reviewed by me and considered in my medical decision making (see chart for details).     68 y.o. male history of hypertension, HIV, mitral regurg status post valve replacement on chronic anticoagulation, chronic systolic heart failure followed by cardiology at [facility] presenting for right  subcostal pain.  Patient is hemodynamically stable: States that his elevated heart rate has been consistent status post his most recent surgery.  Patient appears well, nontoxic.  Physical exam is very benign.  Reassurance is provided, return precautions discussed, patient verbalized understanding. Final Clinical Impressions(s) / UC Diagnoses   Final diagnoses:  Costal margin pain     Discharge Instructions     Use ice/heat as needed for additional pain relief. Can massage for additional relief. Return if you develop weakness, dizziness, chest pain, difficulty breathing.     ED Prescriptions    None     Controlled Substance Prescriptions [place] Controlled Substance Registry consulted? Not Applicable   Quincy Sheehan, [place] 11/17/18 1521

## 2018-11-16 NOTE — Telephone Encounter (Signed)
He is going to need to be evaluated and get CXR--but I don't think we can see him here due to the cough, etc [facility] COVID testing may be appropriate as well--but should be seen due to need for CXR

## 2018-11-17 ENCOUNTER — Encounter (HOSPITAL_COMMUNITY): Payer: Self-pay | Admitting: Emergency Medicine

## 2018-11-17 DIAGNOSIS — I5022 Chronic systolic (congestive) heart failure: Secondary | ICD-10-CM | POA: Diagnosis not present

## 2018-12-03 ENCOUNTER — Other Ambulatory Visit: Payer: Self-pay | Admitting: Family

## 2018-12-03 DIAGNOSIS — B9562 Methicillin resistant Staphylococcus aureus infection as the cause of diseases classified elsewhere: Secondary | ICD-10-CM

## 2018-12-03 DIAGNOSIS — R7881 Bacteremia: Secondary | ICD-10-CM

## 2018-12-07 ENCOUNTER — Encounter: Payer: Self-pay | Admitting: *Deleted

## 2018-12-07 DIAGNOSIS — Z952 Presence of prosthetic heart valve: Secondary | ICD-10-CM

## 2018-12-07 DIAGNOSIS — Z951 Presence of aortocoronary bypass graft: Secondary | ICD-10-CM

## 2018-12-07 NOTE — Progress Notes (Signed)
Cardiac Individual Treatment Plan  Patient Details  Name: Dale Mcdonald MRN: [id number] Date of Birth: Jun 23, 1950 Referring Provider:     Cardiac Rehab from 07/28/2018 in [facility] Cardiac and Pulmonary Rehab  Referring Provider  [place]      Initial Encounter Date:    Cardiac Rehab from 07/28/2018 in [facility] Cardiac and Pulmonary Rehab  Date  07/28/18      Visit Diagnosis: S/P CABG x 3  S/P mitral valve replacement  Patient's Home Medications on Admission:  Current Outpatient Medications:  .  aspirin EC 81 MG tablet, Take 81 mg by mouth daily., Disp: , Rfl:  .  atorvastatin (LIPITOR) 20 MG tablet, TAKE 1 TABLET BY MOUTH  DAILY, Disp: 90 tablet, Rfl: 3 .  DESCOVY 200-25 MG tablet, TAKE 1 TABLET BY MOUTH DAILY, Disp: 30 tablet, Rfl: 5 .  diphenhydrAMINE (BENADRYL) 25 mg capsule, Take by mouth., Disp: , Rfl:  .  doxycycline (VIBRA-TABS) 100 MG tablet, TAKE 1 TABLET BY MOUTH TWICE A DAY, Disp: 60 tablet, Rfl: 0 .  lisinopril (PRINIVIL,ZESTRIL) 5 MG tablet, Take 1 tablet by mouth daily., Disp: , Rfl:  .  Melatonin 5 MG TABS, Take 5 mg by mouth at bedtime. , Disp: , Rfl:  .  metoprolol succinate (TOPROL-XL) 25 MG 24 hr tablet, Take 1 tablet by mouth daily., Disp: , Rfl:  .  Multiple Vitamins-Minerals (MULTIVITAMIN ADULT PO), Take by mouth., Disp: , Rfl:  .  nystatin (MYCOSTATIN) 100000 UNIT/ML suspension, SWISH AND SWALLOW 5 MLS 4 (FOUR) TIMES DAILY FOR 10 DAYS, Disp: , Rfl:  .  rivaroxaban (XARELTO) 20 MG TABS tablet, Take by mouth., Disp: , Rfl:  .  sulfamethoxazole-trimethoprim (BACTRIM DS,SEPTRA DS) 800-160 MG tablet, Take 1 tablet by mouth 2 (two) times daily., Disp: 60 tablet, Rfl: 2 .  TIVICAY 50 MG tablet, TAKE 1 TABLET(50 MG) BY MOUTH DAILY, Disp: 30 tablet, Rfl: 5 .  torsemide (DEMADEX) 20 MG tablet, Take 1 tablet by mouth daily. , Disp: , Rfl:  .  traZODone (DESYREL) 50 MG tablet, TAKE 1-2 TABLETS (50-100 MG TOTAL) BY MOUTH AT BEDTIME., Disp: 180 tablet, Rfl: 1 .  valACYclovir  (VALTREX) 500 MG tablet, Take 1 tablet (500 mg total) by mouth daily as needed (for fever blisters.)., Disp: 30 tablet, Rfl: 5  Past Medical History: Past Medical History:  Diagnosis Date  . Anxiety   . Anxiety 03/29/2017  . Heart murmur   . Hepatitis    B  . HIV (human immunodeficiency virus infection) ([place])   . Hyperlipidemia   . Hypertension   . Palpitations     Tobacco Use: Social History   Tobacco Use  Smoking Status Former Smoker  . Packs/day: 2.00  . Years: 40.00  . Pack years: 80.00  . Types: Cigarettes  Smokeless Tobacco Never Used  Tobacco Comment   Quit 05/25/2017    Labs: Recent Review Flowsheet Data    Labs for ITP Cardiac and Pulmonary Rehab Latest Ref Rng & Units 12/25/2015 02/24/2016 12/28/2016 04/27/2017 03/08/2018   Cholestrol <200 mg/dL 164 165 159 128 133   LDLCALC mg/dL (calc) 87 91 - 73 73   LDLDIRECT mg/dL - - 79.0 - -   HDL >40 mg/dL 47.80 46 47.20 40(L) 46   Trlycerides <150 mg/dL 147.0 138 209.0(H) 69 62   Hemoglobin A1c <5.7 % of total Hgb - 6.2(H) - 5.7(H) 6.3(H)       Exercise Target Goals: Exercise Program Goal: Individual exercise prescription set using results from initial  6 min walk test and THRR while considering  patient's activity barriers and safety.   Exercise Prescription Goal: Initial exercise prescription builds to 30-45 minutes a day of aerobic activity, 2-3 days per week.  Home exercise guidelines will be given to patient during program as part of exercise prescription that the participant will acknowledge.  Activity Barriers & Risk Stratification: Activity Barriers & Cardiac Risk Stratification - 07/28/18 1355      Activity Barriers & Cardiac Risk Stratification   Activity Barriers  Incisional Pain;Shortness of Breath    Cardiac Risk Stratification  High       6 Minute Walk: 6 Minute Walk    Row Name 07/28/18 1442         6 Minute Walk   Phase  Initial     Distance  1142 feet     Walk Time  5.5 minutes     # of  Rest Breaks  1     MPH  2.36     METS  3     RPE  13     Perceived Dyspnea   3.5     VO2 Peak  10.77     Symptoms  Yes (comment)     Comments  SOB - has had sporadically since returning from travel     Resting HR  80 bpm     Resting BP  96/54     Resting Oxygen Saturation   100 %     Exercise Oxygen Saturation  during 6 min walk  98 %     Max Ex. HR  101 bpm     Max Ex. BP  130/66     2 Minute Post BP  106/62        Oxygen Initial Assessment:   Oxygen Re-Evaluation:   Oxygen Discharge (Final Oxygen Re-Evaluation):   Initial Exercise Prescription: Initial Exercise Prescription - 07/28/18 1400      Date of Initial Exercise RX and Referring Provider   Date  07/28/18    Referring Provider  [place]      Treadmill   MPH  2.3    Grade  1    Minutes  15    METs  3      Recumbant Bike   Level  2    RPM  60    Watts  25    Minutes  15    METs  3      NuStep   Level  3    SPM  80    Minutes  15    METs  3      Prescription Details   Frequency (times per week)  3    Duration  Progress to 45 minutes of aerobic exercise without signs/symptoms of physical distress      Intensity   THRR 40-80% of Max Heartrate  109-138    Ratings of Perceived Exertion  11-13    Perceived Dyspnea  0-4      Resistance Training   Training Prescription  Yes    Weight  3 lb    Reps  10-15       Perform Capillary Blood Glucose checks as needed.  Exercise Prescription Changes: Exercise Prescription Changes    Row Name 07/28/18 1400 08/09/18 1200           Response to Exercise   Blood Pressure (Admit)  96/54  122/70      Blood Pressure (Exercise)  130/66  150/70  Blood Pressure (Exit)  106/62  104/58      Heart Rate (Admit)  80 bpm  103 bpm      Heart Rate (Exercise)  101 bpm  122 bpm      Heart Rate (Exit)  85 bpm  108 bpm      Oxygen Saturation (Admit)  100 %  -      Oxygen Saturation (Exit)  98 %  -      Rating of Perceived Exertion (Exercise)  13  12       Symptoms  -  none      Comments  -  2nd day      Duration  -  Continue with 45 min of aerobic exercise without signs/symptoms of physical distress.      Intensity  -  THRR unchanged        Progression   Progression  -  Continue to progress workloads to maintain intensity without signs/symptoms of physical distress.        Resistance Training   Training Prescription  -  Yes      Weight  -  3 lb      Reps  -  10-15        Interval Training   Interval Training  -  No        Treadmill   MPH  -  2.3      Grade  -  1      Minutes  -  15      METs  -  3.08        Recumbant Bike   Level  -  2      RPM  -  60      Watts  -  25      Minutes  -  15      METs  -  3        NuStep   Level  -  3      SPM  -  80      Minutes  -  15      METs  -  2.7         Exercise Comments: Exercise Comments    Row Name 08/03/18 1616           Exercise Comments  First full day of exercise!  Patient was oriented to gym and equipment including functions, settings, policies, and procedures.  Patient's individual exercise prescription and treatment plan were reviewed.  All starting workloads were established based on the results of the 6 minute walk test done at initial orientation visit.  The plan for exercise progression was also introduced and progression will be customized based on patient's performance and goals.          Exercise Goals and Review: Exercise Goals    Row Name 07/28/18 1441 08/30/18 1107           Exercise Goals   Increase Physical Activity  Yes  Yes      Intervention  Provide advice, education, support and counseling about physical activity/exercise needs.;Develop an individualized exercise prescription for aerobic and resistive training based on initial evaluation findings, risk stratification, comorbidities and participant's personal goals.  -      Expected Outcomes  Short Term: Attend rehab on a regular basis to increase amount of physical activity.;Long Term: Add in  home exercise to make exercise part of routine and to increase amount of physical activity.;Long Term: Exercising  regularly at least 3-5 days a week.  -      Increase Strength and Stamina  Yes  Yes      Intervention  Provide advice, education, support and counseling about physical activity/exercise needs.;Develop an individualized exercise prescription for aerobic and resistive training based on initial evaluation findings, risk stratification, comorbidities and participant's personal goals.  -      Expected Outcomes  Short Term: Increase workloads from initial exercise prescription for resistance, speed, and METs.;Short Term: Perform resistance training exercises routinely during rehab and add in resistance training at home;Long Term: Improve cardiorespiratory fitness, muscular endurance and strength as measured by increased METs and functional capacity (6MWT)  -      Able to understand and use rate of perceived exertion (RPE) scale  Yes  -      Intervention  Provide education and explanation on how to use RPE scale  -      Expected Outcomes  Short Term: Able to use RPE daily in rehab to express subjective intensity level;Long Term:  Able to use RPE to guide intensity level when exercising independently  -      Able to understand and use Dyspnea scale  Yes  -      Intervention  Provide education and explanation on how to use Dyspnea scale  -      Expected Outcomes  Short Term: Able to use Dyspnea scale daily in rehab to express subjective sense of shortness of breath during exertion;Long Term: Able to use Dyspnea scale to guide intensity level when exercising independently  -      Knowledge and understanding of Target Heart Rate Range (THRR)  Yes  Yes      Intervention  Provide education and explanation of THRR including how the numbers were predicted and where they are located for reference  -      Expected Outcomes  Short Term: Able to state/look up THRR;Short Term: Able to use daily as guideline for  intensity in rehab;Long Term: Able to use THRR to govern intensity when exercising independently  -      Able to check pulse independently  Yes  -      Intervention  Provide education and demonstration on how to check pulse in carotid and radial arteries.;Review the importance of being able to check your own pulse for safety during independent exercise  -      Expected Outcomes  Short Term: Able to explain why pulse checking is important during independent exercise;Long Term: Able to check pulse independently and accurately  -      Understanding of Exercise Prescription  Yes  -      Intervention  Provide education, explanation, and written materials on patient's individual exercise prescription  -      Expected Outcomes  Short Term: Able to explain program exercise prescription;Long Term: Able to explain home exercise prescription to exercise independently  -         Exercise Goals Re-Evaluation : Exercise Goals Re-Evaluation    Row Name 08/03/18 1616 08/30/18 1108 10/27/18 1352 11/09/18 1009       Exercise Goal Re-Evaluation   Exercise Goals Review  Increase Physical Activity;Able to understand and use rate of perceived exertion (RPE) scale;Knowledge and understanding of Target Heart Rate Range (THRR);Understanding of Exercise Prescription;Increase Strength and Stamina;Able to understand and use Dyspnea scale  Increase Physical Activity;Increase Strength and Stamina;Able to understand and use rate of perceived exertion (RPE) scale;Knowledge and understanding of Target Heart Rate  Range (THRR);Able to check pulse independently;Understanding of Exercise Prescription  Increase Physical Activity;Increase Strength and Stamina;Able to understand and use rate of perceived exertion (RPE) scale;Able to understand and use Dyspnea scale;Knowledge and understanding of Target Heart Rate Range (THRR);Able to check pulse independently  -    Comments  Reviewed RPE scale, THR and program prescription with pt today.   Pt voiced understanding and was given a copy of goals to take home.   [place] has an elliptical at home and has been exercising 3-4 days per week and following the videos sent by staff.  He has also been working in his yard and has had no problems with that  We reviewed RPE and THR range.  Dale Mcdonald has been walking 5 days per week - he is being cautious with heat.  He is hoping the mall will open soon so he can walk indoors. He does not want to use the app.  BP and weight steady.  [name]   Dale Mcdonald walked at [employer] last week.  He is walking whenever weather allows.  He has some bloodwork to monitor kidney function due to [facility].He is able to do housework and yardwork as needed and all ADLs without fatigue.    Expected Outcomes  Short: Use RPE daily to regulate intensity. Long: Follow program prescription in THR.  Short - continue to exrecise at home monitoring RPE and HR Long - improve overall fitness  Short - continue to walk at home and monitor vitals Long - maintain heart health  Short - continue to walk at home, add strength work when he can be supervised Long - maintain ability to do ADLs       Discharge Exercise Prescription (Final Exercise Prescription Changes): Exercise Prescription Changes - 08/09/18 1200      Response to Exercise   Blood Pressure (Admit)  122/70    Blood Pressure (Exercise)  150/70    Blood Pressure (Exit)  104/58    Heart Rate (Admit)  103 bpm    Heart Rate (Exercise)  122 bpm    Heart Rate (Exit)  108 bpm    Rating of Perceived Exertion (Exercise)  12    Symptoms  none    Comments  2nd day    Duration  Continue with 45 min of aerobic exercise without signs/symptoms of physical distress.    Intensity  THRR unchanged      Progression   Progression  Continue to progress workloads to maintain intensity without signs/symptoms of physical distress.      Resistance Training   Training Prescription  Yes    Weight  3 lb    Reps  10-15      Interval  Training   Interval Training  No      Treadmill   MPH  2.3    Grade  1    Minutes  15    METs  3.08      Recumbant Bike   Level  2    RPM  60    Watts  25    Minutes  15    METs  3      NuStep   Level  3    SPM  80    Minutes  15    METs  2.7       Nutrition:  Target Goals: Understanding of nutrition guidelines, daily intake of sodium <1565m, cholesterol <2071m calories 30% from fat and 7% or less from saturated fats,  daily to have 5 or more servings of fruits and vegetables.  Biometrics: Pre Biometrics - 07/28/18 1441      Pre Biometrics   Height  _0  (1.803 m)    Weight  172 lb 8 oz (78.2 kg)    Waist Circumference  39 inches    Hip Circumference  38 inches    Waist to Hip Ratio  1.03 %    BMI (Calculated)  24.07    Single Leg Stand  30 seconds        Nutrition Therapy Plan and Nutrition Goals: Nutrition Therapy & Goals - 07/28/18 1412      Intervention Plan   Intervention  Prescribe, educate and counsel regarding individualized specific dietary modifications aiming towards targeted core components such as weight, hypertension, lipid management, diabetes, heart failure and other comorbidities.    Expected Outcomes  Short Term Goal: Understand basic principles of dietary content, such as calories, fat, sodium, cholesterol and nutrients.;Short Term Goal: A plan has been developed with personal nutrition goals set during dietitian appointment.;Long Term Goal: Adherence to prescribed nutrition plan.       Nutrition Assessments: Nutrition Assessments - 07/28/18 1412      MEDFICTS Scores   Pre Score  30       Nutrition Goals Re-Evaluation:   Nutrition Goals Discharge (Final Nutrition Goals Re-Evaluation):   Psychosocial: Target Goals: Acknowledge presence or absence of significant depression and/or stress, maximize coping skills, provide positive support system. Participant is able to verbalize types and ability to use techniques and skills needed  for reducing stress and depression.   Initial Review & Psychosocial Screening: Initial Psych Review & Screening - 07/28/18 1409      Initial Review   Current issues with  Current Sleep Concerns;Current Stress Concerns    Source of Stress Concerns  Unable to perform yard/household activities    Comments  -Surgery done in Novemeber, then several surgeries Dec for infection. Still has 10 pound weight limit for lifting.      Family Dynamics   Good Support System?  Yes   Sisters and son   Son lives out of state     Barriers   Psychosocial barriers to participate in program  There are no identifiable barriers or psychosocial needs.;The patient should benefit from training in stress management and relaxation.      Screening Interventions   Interventions  Encouraged to exercise;To provide support and resources with identified psychosocial needs;Provide feedback about the scores to participant    Expected Outcomes  Short Term goal: Utilizing psychosocial counselor, staff and physician to assist with identification of specific Stressors or current issues interfering with healing process. Setting desired goal for each stressor or current issue identified.;Long Term Goal: Stressors or current issues are controlled or eliminated.;Short Term goal: Identification and review with participant of any Quality of Life or Depression concerns found by scoring the questionnaire.;Long Term goal: The participant improves quality of Life and PHQ9 Scores as seen by post scores and/or verbalization of changes       Quality of Life Scores:  Quality of Life - 07/28/18 1412      Quality of Life   Select  Quality of Life      Quality of Life Scores   Health/Function Pre  13.47 %    Socioeconomic Pre  20.07 %    Psych/Spiritual Pre  13.86 %    Family Pre  19.5 %    GLOBAL Pre  15.79 %  Scores of 19 and below usually indicate a poorer quality of life in these areas.  A difference of  2-3 points is a  clinically meaningful difference.  A difference of 2-3 points in the total score of the Quality of Life Index has been associated with significant improvement in overall quality of life, self-image, physical symptoms, and general health in studies assessing change in quality of life.  PHQ-9: Recent Review Flowsheet Data    Depression screen [facility] 2/9 08/10/2018 07/28/2018 07/12/2018 03/30/2018 01/03/2018   Decreased Interest 0 1 0 1 0   Down, Depressed, Hopeless 0 1 0 1 1   PHQ - 2 Score 0 2 0 2 1   Altered sleeping - 3 - 1 -   Tired, decreased energy - 2 - 1 -   Change in appetite - 1 - 1 -   Feeling bad or failure about yourself  - 1 - 1 -   Trouble concentrating - 0 - 1 -   Moving slowly or fidgety/restless - 0 - 1 -   Suicidal thoughts - 0 - 1 -   PHQ-9 Score - 9 - 9 -   Difficult doing work/chores - Somewhat difficult - - -     Interpretation of Total Score  Total Score Depression Severity:  1-4 = Minimal depression, 5-9 = Mild depression, 10-14 = Moderate depression, 15-19 = Moderately severe depression, 20-27 = Severe depression   Psychosocial Evaluation and Intervention: Psychosocial Evaluation - 08/15/18 1058      Psychosocial Evaluation & Interventions   Interventions  Encouraged to exercise with the program and follow exercise prescription    Comments  Counselor contacted patient to conduct initial psychological assessment.  Patient shared thoughts and feelings related to multiple surgeries he had between November and December 2019.  He reported that he has been on antibiotics since first surgery and the medication has impacted how he is able to eat as the extended use of antibiotics has led to candida.  Patient does have several other comorbidities which he feels are well managed.  Sleep is an issue and takes trazadone each night.  He reports that sleep has improved in the last month.  No stressors to report at present.  Patient stated that he felt some depression after the infections  which led to December surgeries but feels that has lifted.  Noted some anxiety but self-manges with positive outlook..  Patient likes to travel, has family in [place] and in [place].    Expected Outcomes  Short term goal: doing videos every other day, in-home exercise machine daily; long-term goal: get back to how he felt before surgeries, be able to travel and maintain lawn    Continue Psychosocial Services   Follow up required by staff       Psychosocial Re-Evaluation:   Psychosocial Discharge (Final Psychosocial Re-Evaluation):   Vocational Rehabilitation: Provide vocational rehab assistance to qualifying candidates.   Vocational Rehab Evaluation & Intervention: Vocational Rehab - 07/28/18 1414      Initial Vocational Rehab Evaluation & Intervention   Assessment shows need for Vocational Rehabilitation  No       Education: Education Goals: Education classes will be provided on a variety of topics geared toward better understanding of heart health and risk factor modification. Participant will state understanding/return demonstration of topics presented as noted by education test scores.  Learning Barriers/Preferences: Learning Barriers/Preferences - 07/28/18 1412      Learning Barriers/Preferences   Learning Barriers  None  Learning Preferences  Individual Instruction;Group Instruction;Verbal Instruction;Skilled Demonstration       Education Topics:  AED/CPR: - Group verbal and written instruction with the use of models to demonstrate the basic use of the AED with the basic ABC's of resuscitation.   General Nutrition Guidelines/Fats and Fiber: -Group instruction provided by verbal, written material, models and posters to present the general guidelines for heart healthy nutrition. Gives an explanation and review of dietary fats and fiber.   Controlling Sodium/Reading Food Labels: -Group verbal and written material supporting the discussion of sodium use in heart  healthy nutrition. Review and explanation with models, verbal and written materials for utilization of the food label.   Cardiac Rehab from 08/03/2018 in [facility]iac [facility] Rehab  Date  08/03/18  Educator  [facility]  Instruction Review Code  1- Verbalizes Understanding      Exercise Physiology & General Exercise Guidelines: - Group verbal and written instruction with models to review the exercise physiology of the cardiovascular system and associated critical values. Provides general exercise guidelines with specific guidelines to those with heart or lung disease.    Aerobic Exercise & Resistance Training: - Gives group verbal and written instruction on the various components of exercise. Focuses on aerobic and resistive training programs and the benefits of this training and how to safely progress through these programs..   Flexibility, Balance, Mind/Body Relaxation: Provides group verbal/written instruction on the benefits of flexibility and balance training, including mind/body exercise modes such as yoga, pilates and tai chi.  Demonstration and skill practice provided.   Stress and Anxiety: - Provides group verbal and written instruction about the health risks of elevated stress and causes of high stress.  Discuss the correlation between heart/lung disease and anxiety and treatment options. Review healthy ways to manage with stress and anxiety.   Depression: - Provides group verbal and written instruction on the correlation between heart/lung disease and depressed mood, treatment options, and the stigmas associated with seeking treatment.   Anatomy & Physiology of the Heart: - Group verbal and written instruction and models provide basic cardiac anatomy and physiology, with the coronary electrical and arterial systems. Review of Valvular disease and Heart Failure   Cardiac Procedures: - Group verbal and written instruction to review commonly prescribed medications for heart  disease. Reviews the medication, class of the drug, and side effects. Includes the steps to properly store meds and maintain the prescription regimen. (beta blockers and nitrates)   Cardiac Medications I: - Group verbal and written instruction to review commonly prescribed medications for heart disease. Reviews the medication, class of the drug, and side effects. Includes the steps to properly store meds and maintain the prescription regimen.   Cardiac Medications II: -Group verbal and written instruction to review commonly prescribed medications for heart disease. Reviews the medication, class of the drug, and side effects. (all other drug classes)    Go Sex-Intimacy & Heart Disease, Get SMART - Goal Setting: - Group verbal and written instruction through game format to discuss heart disease and the return to sexual intimacy. Provides group verbal and written material to discuss and apply goal setting through the application of the S.M.A.R.T. Method.   Other Matters of the Heart: - Provides group verbal, written materials and models to describe Stable Angina and Peripheral Artery. Includes description of the disease process and treatment options available to the cardiac patient.   Exercise & Equipment Safety: - Individual verbal instruction and demonstration of equipment use and safety with use  of the equipment.   Cardiac Rehab from 08/03/2018 in [facility] Cardiac and Pulmonary Rehab  Date  07/28/18  Educator  mc  Instruction Review Code  1- Verbalizes Understanding      Infection Prevention: - Provides verbal and written material to individual with discussion of infection control including proper hand washing and proper equipment cleaning during exercise session.   Cardiac Rehab from 08/03/2018 in [facility] Cardiac and Pulmonary Rehab  Date  07/28/18  Educator  mc  Instruction Review Code  1- Verbalizes Understanding      Falls Prevention: - Provides verbal and written material to  individual with discussion of falls prevention and safety.   Cardiac Rehab from 08/03/2018 in [facility] Cardiac and Pulmonary Rehab  Date  07/28/18  Educator  mc  Instruction Review Code  1- Verbalizes Understanding      Diabetes: - Individual verbal and written instruction to review signs/symptoms of diabetes, desired ranges of glucose level fasting, after meals and with exercise. Acknowledge that pre and post exercise glucose checks will be done for 3 sessions at entry of program.   Know Your Numbers and Risk Factors: -Group verbal and written instruction about important numbers in your health.  Discussion of what are risk factors and how they play a role in the disease process.  Review of Cholesterol, Blood Pressure, Diabetes, and BMI and the role they play in your overall health.   Sleep Hygiene: -Provides group verbal and written instruction about how sleep can affect your health.  Define sleep hygiene, discuss sleep cycles and impact of sleep habits. Review good sleep hygiene tips.    Other: -Provides group and verbal instruction on various topics (see comments)   Knowledge Questionnaire Score: Knowledge Questionnaire Score - 07/28/18 1413      Knowledge Questionnaire Score   Pre Score  23/26   Correct responses were reviewed with Bill today. He verbalized understanding of the responses.       Core Components/Risk Factors/Patient Goals at Admission: Personal Goals and Risk Factors at Admission - 07/28/18 1414      Core Components/Risk Factors/Patient Goals on Admission    Weight Management  Yes;Weight Maintenance    Intervention  Weight Management: Develop a combined nutrition and exercise program designed to reach desired caloric intake, while maintaining appropriate intake of nutrient and fiber, sodium and fats, and appropriate energy expenditure required for the weight goal.;Weight Management: Provide education and appropriate resources to help participant work on and attain  dietary goals.   Lost 15 pounds since surgery. Wants to keep weight where he is now  172.5 lbs   Expected Outcomes  Short Term: Continue to assess and modify interventions until short term weight is achieved;Long Term: Adherence to nutrition and physical activity/exercise program aimed toward attainment of established weight goal    Improve shortness of breath with ADL's  Yes    Intervention  Provide education, individualized exercise plan and daily activity instruction to help decrease symptoms of SOB with activities of daily living.    Expected Outcomes  Short Term: Improve cardiorespiratory fitness to achieve a reduction of symptoms when performing ADLs;Long Term: Be able to perform more ADLs without symptoms or delay the onset of symptoms    Hypertension  Yes    Intervention  Provide education on lifestyle modifcations including regular physical activity/exercise, weight management, moderate sodium restriction and increased consumption of fresh fruit, vegetables, and low fat dairy, alcohol moderation, and smoking cessation.;Monitor prescription use compliance.    Expected Outcomes  Short  Term: Continued assessment and intervention until BP is < 140/98m HG in hypertensive participants. < 130/880mHG in hypertensive participants with diabetes, heart failure or chronic kidney disease.;Long Term: Maintenance of blood pressure at goal levels.    Lipids  Yes    Intervention  Provide education and support for participant on nutrition & aerobic/resistive exercise along with prescribed medications to achieve LDL <7076mHDL >55m76m  Expected Outcomes  Short Term: Participant states understanding of desired cholesterol values and is compliant with medications prescribed. Participant is following exercise prescription and nutrition guidelines.;Long Term: Cholesterol controlled with medications as prescribed, with individualized exercise RX and with personalized nutrition plan. Value goals: LDL < 70mg20mL > 40  mg.       Core Components/Risk Factors/Patient Goals Review:  Goals and Risk Factor Review    Row Name 08/30/18 1057 09/16/18 1058           Core Components/Risk Factors/Patient Goals Review   Personal Goals Review  Weight Management/Obesity;Lipids;Hypertension;Stress  Heart Failure;Lipids;Hypertension      Review  BillyHavenbeen taking meds as directed.  He sees his cardiologist May 1 and will talk with him about sleeping.  [name]  [name]    he has 5 lb hand weights and has been doing the videoHerbalistillyKodiea fever [facility].  He had surgery for this.  [name] ( 10 lb weight restriction) [name]      Expected Outcomes  Short - continue to exercise at home Long - improve overall fitness  Short - follow up with Dr Monday Long - continue Dr plan of care          Core Components/Risk Factors/Patient Goals at Discharge (Final Review):  Goals and Risk Factor Review - 09/16/18 1058      Core Components/Risk Factors/Patient Goals Review   Personal Goals Review  Heart Failure;Lipids;Hypertension    Review  [facility].  He had surgery for this.  [name] ( 10 lb weight restriction) [name]    Expected Outcomes  Short - follow up with Dr Monday Long - continue Dr plan of care        ITP Comments: ITP Comments    Row Name 07/28/18 1335 08/12/18 0811 08/17/18 1327 10/27/18 1351     ITP Comments  Medical review and evaluation completed today. ITP sent to Dr MilleSabra Heckreview,changes as needed and signature. Documentation of the diagnosis can be found in  [facility]/2019  Our program is currently closed due to COVID-19.  We  are communicating with patient via phone calls and emails.   30 day review completed. Continue with ITP unless directed changes by Medical Director at review.  His Dr changed med from ACE inhibitor to [place] EF has dropped since before surgery       Comments:

## 2018-12-27 ENCOUNTER — Other Ambulatory Visit: Payer: Self-pay | Admitting: Family

## 2018-12-27 DIAGNOSIS — B9562 Methicillin resistant Staphylococcus aureus infection as the cause of diseases classified elsewhere: Secondary | ICD-10-CM

## 2018-12-27 DIAGNOSIS — R7881 Bacteremia: Secondary | ICD-10-CM

## 2019-01-05 ENCOUNTER — Ambulatory Visit (INDEPENDENT_AMBULATORY_CARE_PROVIDER_SITE_OTHER): Payer: Medicare Other | Admitting: Internal Medicine

## 2019-01-05 ENCOUNTER — Encounter: Payer: Self-pay | Admitting: Internal Medicine

## 2019-01-05 ENCOUNTER — Other Ambulatory Visit: Payer: Self-pay

## 2019-01-05 DIAGNOSIS — B20 Human immunodeficiency virus [HIV] disease: Secondary | ICD-10-CM

## 2019-01-05 DIAGNOSIS — B181 Chronic viral hepatitis B without delta-agent: Secondary | ICD-10-CM

## 2019-01-05 DIAGNOSIS — I480 Paroxysmal atrial fibrillation: Secondary | ICD-10-CM | POA: Diagnosis not present

## 2019-01-05 DIAGNOSIS — Z0000 Encounter for general adult medical examination without abnormal findings: Secondary | ICD-10-CM | POA: Diagnosis not present

## 2019-01-05 DIAGNOSIS — F39 Unspecified mood [affective] disorder: Secondary | ICD-10-CM

## 2019-01-05 DIAGNOSIS — I5022 Chronic systolic (congestive) heart failure: Secondary | ICD-10-CM

## 2019-01-05 DIAGNOSIS — Z7189 Other specified counseling: Secondary | ICD-10-CM

## 2019-01-05 NOTE — Assessment & Plan Note (Signed)
See social history 

## 2019-01-05 NOTE — Assessment & Plan Note (Signed)
Compensated He adjusts his diuretics per weight Sees cardiologist

## 2019-01-05 NOTE — Assessment & Plan Note (Signed)
No evidence of recurrence now

## 2019-01-05 NOTE — Assessment & Plan Note (Signed)
I have personally reviewed the Medicare Annual Wellness questionnaire and have noted 1. The patient's medical and social history 2. Their use of alcohol, tobacco or illicit drugs 3. Their current medications and supplements 4. The patient's functional ability including ADL's, fall risks, home safety risks and hearing or visual             impairment. 5. Diet and physical activities 6. Evidence for depression or mood disorders  The patients weight, height, BMI and visual acuity have been recorded in the chart I have made referrals, counseling and provided education to the patient based review of the above and I have provided the pt with a written personalized care plan for preventive services.  I have provided you with a copy of your personalized plan for preventive services. Please take the time to review along with your updated medication list.  Flu vaccine in 1-2 weeks shingrix at the pharmacy Colon will be due again in 4 years (polyps) Discussed PSA---will defer again Discussed exercise

## 2019-01-05 NOTE — Assessment & Plan Note (Signed)
Chronic anxiety Some sleep issues No sig depression now Just trazodone for now

## 2019-01-05 NOTE — Progress Notes (Signed)
Subjective:    Patient ID: Dale Mcdonald, male    DOB: December 25, 1950, 68 y.o.   MRN: [id number]  HPI Here for Medicare wellness and follow up of chronic health conditions Reviewed form and advanced directives Reviewed other doctors---all in Epic Rare beer Still vaping--discussed cessation (no nicotine though) Tries to exercise Vision is good--still has the film over his eye Hearing is fine No falls Mild mood issues persist Still in distant relationship  --she is at the beach. 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 No PND No palpitations No edema now  Continues on antivirals for Hep B and HIV Doing well with this Still sees Dr Drucilla Schmidt  Mood is better No regular depression now Trouble with the COVID---staying socially distant Did visit son in [place]--drove Chronic anxiety state----mostly in spells and can control it Not anhedonic  Current Outpatient Medications on File Prior to Visit  Medication Sig Dispense Refill  . aspirin EC 81 MG tablet Take 81 mg by mouth daily.    Marland Kitchen atorvastatin (LIPITOR) 20 MG tablet TAKE 1 TABLET BY MOUTH  DAILY 90 tablet 3  . DESCOVY 200-25 MG tablet TAKE 1 TABLET BY MOUTH DAILY 30 tablet 5  . diphenhydrAMINE (BENADRYL) 25 mg capsule Take by mouth.    . ENTRESTO 49-51 MG Take 1 tablet by mouth 2 (two) times daily.    . Melatonin 5 MG TABS Take 5 mg by mouth at bedtime.     . Multiple Vitamins-Minerals (MULTIVITAMIN ADULT PO) Take by mouth.    . Potassium Chloride ER 20 MEQ TBCR Take 1 tablet by mouth as directed.     Marland Kitchen TIVICAY 50 MG tablet TAKE 1 TABLET(50 MG) BY MOUTH DAILY 30 tablet 5  . torsemide (DEMADEX) 20 MG tablet Take 1 tablet by mouth daily.     Marland Kitchen torsemide (DEMADEX) 20 MG tablet 1-2 tabs daily as needed    . traZODone (DESYREL) 50 MG tablet TAKE 1-2 TABLETS (50-100 MG TOTAL) BY MOUTH AT BEDTIME. 180 tablet 1  . valACYclovir (VALTREX) 500 MG tablet Take 1 tablet (500 mg total) by mouth daily as needed (for fever blisters.). 30 tablet 5   No current facility-administered medications on file prior to visit.     Allergies  Allergen Reactions  . Magnesium Nausea Only    Past Medical History:  Diagnosis Date  . Anxiety   . Anxiety 03/29/2017  . Heart murmur   . Hepatitis    B  . HIV (human immunodeficiency virus infection) ([place])   . Hyperlipidemia   . Hypertension   . Palpitations     Past Surgical History:  Procedure Laterality Date  . CATARACT EXTRACTION W/PHACO Left 11/11/2017   Procedure: CATARACT EXTRACTION PHACO AND INTRAOCULAR LENS PLACEMENT (IOC);  Surgeon: Birder Robson, MD;  Location: [facility] ORS;  Service: Ophthalmology;  Laterality: Left;  [place] 00:59.3 AP% 19.5 CDE 11.54 Fluid pack lot # [id number]  . CATARACT EXTRACTION W/PHACO Right 12/21/2017   Procedure: CATARACT EXTRACTION PHACO AND INTRAOCULAR LENS PLACEMENT (IOC);  Surgeon: Birder Robson, MD;  Location: [facility] ORS;  Service: Ophthalmology;  Laterality: Right;  [place] 00:40 AP% 13.5 CDE 5.50 Fluid pack lot $ [id number]  . COLONOSCOPY    . COLONOSCOPY WITH PROPOFOL N/A 02/08/2018   Procedure: COLONOSCOPY WITH PROPOFOL;  Surgeon: Jonathon Bellows, MD;  Location: [facility] ENDOSCOPY;  Service: Gastroenterology;  Laterality: N/A;  . CORONARY ARTERY BYPASS GRAFT  04/07/2018  . EYE SURGERY     RETINA  . HERNIA REPAIR    . MITRAL VALVE REPLACEMENT  03/2018   bovine--with CABG    History reviewed. No pertinent family history.  Social History   Socioeconomic History  . Marital status: Widowed    Spouse name: Not on file  .  Number of children: 1  . Years of education: Not on file  . Highest education level: Not on file  Occupational History  . Occupation: VICE PRESIDENT--Purchasing---retired    Comment: [place]  . Financial resource strain: Not on file  . Food insecurity    Worry: Not on file    Inability: Not on file  . Transportation needs    Medical: Not on file    Non-medical: Not on file  Tobacco Use  . Smoking status: Former Smoker    Packs/day: 2.00    Years: 40.00    Pack years: 80.00    Types: Cigarettes  . Smokeless tobacco: Never Used  . Tobacco comment: Quit 05/25/2017  Substance and Sexual Activity  . Alcohol use: No    Alcohol/week: 0.0 standard drinks  . Drug use: No  . Sexual activity: Not on file  Lifestyle  . Physical activity    Days per week: Not on file    Minutes per session: Not on file  . Stress: Not on file  Relationships  . Social Herbalist on phone: Not on file    Gets together: Not on file    Attends religious service: Not on file    Active member of club or organization: Not on file    Attends meetings of clubs or organizations: Not on file    Relationship status: Not on file  . Intimate partner violence    Fear of current or ex partner: Not on file    Emotionally abused: Not on file    Physically abused: Not on file    Forced sexual activity: Not on file  Other Topics Concern  . Not on file  Social History Narrative   No living will   Would want son [name] to be decision maker   Would accept resuscitation   Not sure about tube feeds  'Review of Systems Getting started with [facility] dental clinic Has some tiny cyst type areas on scalp and back---nothing suspicious. Plans to establish with derm Appetite is better Weight has stabilized Sleeps okay--with the trazodone Wears seat belt Bowels are fine--no blood Voids okay--no incontinence Went to sisters mobile home at the beach--pulled his back. No chronic  problems and the acute pain is mostly better (used ibuprofen briefly) Occasional heartburn--thinks he is taking acid medication (but not on his list)    Objective:   Physical Exam  Constitutional: He is oriented to person, place, and time. He appears well-developed. No distress.  HENT:  Mouth/Throat: Oropharynx is clear and moist. No oropharyngeal exudate.  Neck: No thyromegaly present.  Cardiovascular: Normal rate, regular rhythm, normal heart sounds and intact distal pulses. Exam reveals no gallop.  No murmur heard. Respiratory: Effort normal and breath sounds normal. No respiratory distress. He has no wheezes. He has no rales.  Sternum now well healed  GI: Soft. There is no abdominal tenderness.  Musculoskeletal:        General: No tenderness or edema.  Lymphadenopathy:    He has no cervical adenopathy.  Neurological: He is alert and oriented to person, place, and time.  President--- "Daisy Floro, Obama, Bush" [phone] D-l-r-[id number]-w Recall 3/3  Skin: No rash noted. No erythema.  Psychiatric: He has a normal mood and affect. His behavior is normal.           Assessment & Plan:

## 2019-01-05 NOTE — Progress Notes (Signed)
Hearing Screening   Method: Audiometry   125Hz  250Hz  500Hz  1000Hz  2000Hz  3000Hz  4000Hz  6000Hz  8000Hz   Right ear:   20 20 20  20     Left ear:   20 20 20  20     Vision Screening Comments: January 2020

## 2019-01-05 NOTE — Assessment & Plan Note (Signed)
Viral load remains non detectable

## 2019-01-05 NOTE — Assessment & Plan Note (Signed)
Viral load also non detectable for this Continues on Rx

## 2019-01-08 ENCOUNTER — Other Ambulatory Visit: Payer: Self-pay | Admitting: Internal Medicine

## 2019-02-01 ENCOUNTER — Encounter: Payer: Self-pay | Admitting: *Deleted

## 2019-02-01 DIAGNOSIS — Z951 Presence of aortocoronary bypass graft: Secondary | ICD-10-CM

## 2019-02-01 DIAGNOSIS — Z952 Presence of prosthetic heart valve: Secondary | ICD-10-CM

## 2019-02-01 NOTE — Progress Notes (Signed)
Discharge Progress Report  Patient Details  Name: Dale Mcdonald MRN: [id number] Date of Birth: [birth year] Referring Provider:     Cardiac Rehab from 07/28/2018 in [facility] Cardiac and Pulmonary Rehab  Referring Provider  [place]       Number of Visits: 4  Reason for Discharge:  Early Exit:  Lack of attendance  Smoking History:  Social History   Tobacco Use  Smoking Status Former Smoker  . Packs/day: 2.00  . Years: 40.00  . Pack years: 80.00  . Types: Cigarettes  Smokeless Tobacco Never Used  Tobacco Comment   Quit 05/25/2017    Diagnosis:  S/P CABG x 3  S/P mitral valve replacement  ADL UCSD:   Initial Exercise Prescription:   Discharge Exercise Prescription (Final Exercise Prescription Changes): Exercise Prescription Changes - 08/09/18 1200      Response to Exercise   Blood Pressure (Admit)  122/70    Blood Pressure (Exercise)  150/70    Blood Pressure (Exit)  104/58    Heart Rate (Admit)  103 bpm    Heart Rate (Exercise)  122 bpm    Heart Rate (Exit)  108 bpm    Rating of Perceived Exertion (Exercise)  12    Symptoms  none    Comments  2nd day    Duration  Continue with 45 min of aerobic exercise without signs/symptoms of physical distress.    Intensity  THRR unchanged      Progression   Progression  Continue to progress workloads to maintain intensity without signs/symptoms of physical distress.      Resistance Training   Training Prescription  Yes    Weight  3 lb    Reps  10-15      Interval Training   Interval Training  No      Treadmill   MPH  2.3    Grade  1    Minutes  15    METs  3.08      Recumbant Bike   Level  2    RPM  60    Watts  25    Minutes  15    METs  3      NuStep   Level  3    SPM  80    Minutes  15    METs  2.7       Functional Capacity:   Psychological, QOL, Others - Outcomes: PHQ Mcdonald: Depression screen Dale Mcdonald  Decreased Interest 0 0 1 0 1  Down,  Depressed, Hopeless 0 0 1 0 1  PHQ - 2 Score 0 0 2 0 2  Altered sleeping - - 3 - 1  Tired, decreased energy - - 2 - 1  Change in appetite - - 1 - 1  Feeling bad or failure about yourself  - - 1 - 1  Trouble concentrating - - 0 - 1  Moving slowly or fidgety/restless - - 0 - 1  Suicidal thoughts - - 0 - 1  PHQ-9 Score - - 9 - 9  Difficult doing work/chores - - Somewhat difficult - -    Quality of Life:   Personal Goals: Goals established at orientation with interventions provided to work toward goal.    Personal Goals Discharge: Goals and Risk Factor Review    Row Name 08/30/18 1057 09/16/18 1058           Core Components/Risk Factors/Patient Goals Review   Personal Goals Review  Weight  Management/Obesity;Lipids;Hypertension;Stress  Heart Failure;Lipids;Hypertension      Review  Dale Mcdonald has been taking meds as directed.  He sees his cardiologist May 1 and will talk with him about sleeping.  [name]  [name]    [name]  Dale Mcdonald had a fever and saw his Dr at Dale Mcdonald as it was an area of possible infection.  He had surgery for this.  [name] ( 10 lb weight restriction) [name]      Expected Outcomes  Short - continue to exercise at home Long - improve overall fitness  Short - follow up with Dr Monday Long - continue Dr plan of care          Exercise Goals and Review: Exercise Goals    Row Name 08/30/18 1107             Exercise Goals   Increase Physical Activity  Yes       Increase Strength and Stamina  Yes       Knowledge and understanding of Target Heart Rate Range (THRR)  Yes          Exercise Goals Re-Evaluation: Exercise Goals Re-Evaluation    Row Name 08/30/18 1108 10/27/18 1352 11/09/18 1009         Exercise Goal Re-Evaluation    Exercise Goals Review  Increase Physical Activity;Increase Strength and Stamina;Able to understand and use rate of perceived exertion (RPE) scale;Knowledge and understanding of Target Heart Rate Range (THRR);Able to check pulse independently;Understanding of Exercise Prescription  Increase Physical Activity;Increase Strength and Stamina;Able to understand and use rate of perceived exertion (RPE) scale;Able to understand and use Dyspnea scale;Knowledge and understanding of Target Heart Rate Range (THRR);Able to check pulse independently  -     Comments  Dale Mcdonald has an elliptical at home and has been exercising 3-4 days per week and following the videos sent by staff.  He has also been working in his yard and has had no problems with that  We reviewed RPE and THR range.  Dale Mcdonald has been walking 5 days per week - he is being cautious with heat.  He is hoping the mall will open soon so he can walk indoors. He does not want to use the app.  BP and weight steady.  [name]   Dale Mcdonald walked at [employer] last week.  He is walking whenever weather allows.  He has some bloodwork to monitor kidney function due to [facility].He is able to do housework and yardwork as needed and all ADLs without fatigue.     Expected Outcomes  Short - continue to exrecise at home monitoring RPE and HR Long - improve overall fitness  Short - continue to walk at home and monitor vitals Long - maintain heart health  Short - continue to walk at home, add strength work when he can be supervised Long - maintain ability to do ADLs        Nutrition & Weight - Outcomes:    Nutrition:   Nutrition Discharge:   Education Questionnaire Score:   Goals reviewed with patient; copy given to patient.

## 2019-02-01 NOTE — Progress Notes (Signed)
Cardiac Individual Treatment Plan  Patient Details  Name: Dale Mcdonald MRN: [id number] Date of Birth: [birth year] Referring Provider:     Cardiac Rehab from 07/28/2018 in [facility] Cardiac and Pulmonary Rehab  Referring Provider  [place]      Initial Encounter Date:    Cardiac Rehab from 07/28/2018 in [facility] Cardiac and Pulmonary Rehab  Date  07/28/18      Visit Diagnosis: S/P CABG x 3  S/P mitral valve replacement  Patient's Home Medications on Admission:  Current Outpatient Medications:  .  aspirin EC 81 MG tablet, Take 81 mg by mouth daily., Disp: , Rfl:  .  atorvastatin (LIPITOR) 20 MG tablet, TAKE 1 TABLET BY MOUTH  DAILY, Disp: 90 tablet, Rfl: 3 .  DESCOVY 200-25 MG tablet, TAKE 1 TABLET BY MOUTH DAILY, Disp: 30 tablet, Rfl: 5 .  diphenhydrAMINE (BENADRYL) 25 mg capsule, Take by mouth., Disp: , Rfl:  .  ENTRESTO 49-51 MG, Take 1 tablet by mouth 2 (two) times daily., Disp: , Rfl:  .  Melatonin 5 MG TABS, Take 5 mg by mouth at bedtime. , Disp: , Rfl:  .  Multiple Vitamins-Minerals (MULTIVITAMIN ADULT PO), Take by mouth., Disp: , Rfl:  .  pantoprazole (PROTONIX) 40 MG tablet, Take 1 tablet by mouth daily., Disp: , Rfl:  .  Potassium Chloride ER 20 MEQ TBCR, Take 1 tablet by mouth as directed., Disp: , Rfl:  .  TIVICAY 50 MG tablet, TAKE 1 TABLET(50 MG) BY MOUTH DAILY, Disp: 30 tablet, Rfl: 5 .  torsemide (DEMADEX) 20 MG tablet, Take 1 tablet by mouth daily. , Disp: , Rfl:  .  torsemide (DEMADEX) 20 MG tablet, 1-2 tabs daily as needed, Disp: , Rfl:  .  traZODone (DESYREL) 50 MG tablet, TAKE 1-2 TABLETS (50-100 MG TOTAL) BY MOUTH AT BEDTIME., Disp: 180 tablet, Rfl: 3 .  valACYclovir (VALTREX) 500 MG tablet, Take 1 tablet (500 mg total) by mouth daily as needed (for fever blisters.)., Disp: 30 tablet, Rfl: 5  Past Medical History: Past Medical History:  Diagnosis Date  . Anxiety   . Anxiety 03/29/2017  . Heart murmur   . Hepatitis    B  . HIV (human immunodeficiency virus infection)  ([place])   . Hyperlipidemia   . Hypertension   . Palpitations     Tobacco Use: Social History   Tobacco Use  Smoking Status Former Smoker  . Packs/day: 2.00  . Years: 40.00  . Pack years: 80.00  . Types: Cigarettes  Smokeless Tobacco Never Used  Tobacco Comment   Quit 05/25/2017    Labs: Recent Review Flowsheet Data    Labs for ITP Cardiac and Pulmonary Rehab Latest Ref Rng & Units 12/25/2015 02/24/2016 12/28/2016 04/27/2017 03/08/2018   Cholestrol <200 mg/dL 164 165 159 128 133   LDLCALC mg/dL (calc) 87 91 - 73 73   LDLDIRECT mg/dL - - 79.0 - -   HDL >40 mg/dL 47.80 46 47.20 40(L) 46   Trlycerides <150 mg/dL 147.0 138 209.0(H) 69 62   Hemoglobin A1c <5.7 % of total Hgb - 6.2(H) - 5.7(H) 6.3(H)       Exercise Target Goals: Exercise Program Goal: Individual exercise prescription set using results from initial 6 min walk test and THRR while considering  patient's activity barriers and safety.   Exercise Prescription Goal: Initial exercise prescription builds to 30-45 minutes a day of aerobic activity, 2-3 days per week.  Home exercise guidelines will be given to patient during program as part  of exercise prescription that the participant will acknowledge.  Activity Barriers & Risk Stratification:   6 Minute Walk:   Oxygen Initial Assessment:   Oxygen Re-Evaluation:   Oxygen Discharge (Final Oxygen Re-Evaluation):   Initial Exercise Prescription:   Perform Capillary Blood Glucose checks as needed.  Exercise Prescription Changes: Exercise Prescription Changes    Row Name 08/09/18 1200             Response to Exercise   Blood Pressure (Admit)  122/70       Blood Pressure (Exercise)  150/70       Blood Pressure (Exit)  104/58       Heart Rate (Admit)  103 bpm       Heart Rate (Exercise)  122 bpm       Heart Rate (Exit)  108 bpm       Rating of Perceived Exertion (Exercise)  12       Symptoms  none       Comments  2nd day       Duration  Continue with 45  min of aerobic exercise without signs/symptoms of physical distress.       Intensity  THRR unchanged         Progression   Progression  Continue to progress workloads to maintain intensity without signs/symptoms of physical distress.         Resistance Training   Training Prescription  Yes       Weight  3 lb       Reps  10-15         Interval Training   Interval Training  No         Treadmill   MPH  2.3       Grade  1       Minutes  15       METs  3.08         Recumbant Bike   Level  2       RPM  60       Watts  25       Minutes  15       METs  3         NuStep   Level  3       SPM  80       Minutes  15       METs  2.7          Exercise Comments:   Exercise Goals and Review: Exercise Goals    Row Name 08/30/18 1107             Exercise Goals   Increase Physical Activity  Yes       Increase Strength and Stamina  Yes       Knowledge and understanding of Target Heart Rate Range (THRR)  Yes          Exercise Goals Re-Evaluation : Exercise Goals Re-Evaluation    Row Name 08/30/18 1108 10/27/18 1352 11/09/18 1009         Exercise Goal Re-Evaluation   Exercise Goals Review  Increase Physical Activity;Increase Strength and Stamina;Able to understand and use rate of perceived exertion (RPE) scale;Knowledge and understanding of Target Heart Rate Range (THRR);Able to check pulse independently;Understanding of Exercise Prescription  Increase Physical Activity;Increase Strength and Stamina;Able to understand and use rate of perceived exertion (RPE) scale;Able to understand and use Dyspnea scale;Knowledge and understanding of Target Heart Rate Range (THRR);Able to check pulse  independently  -     Comments  Dale Mcdonald has an elliptical at home and has been exercising 3-4 days per week and following the videos sent by staff.  He has also been working in his yard and has had no problems with that  We reviewed RPE and THR range.  Dale Mcdonald has been walking 5 days per week - he is  being cautious with heat.  He is hoping the mall will open soon so he can walk indoors. He does not want to use the app.  BP and weight steady.  [name]   Dale Mcdonald walked at [employer] last week.  He is walking whenever weather allows.  He has some bloodwork to monitor kidney function due to [facility].He is able to do housework and yardwork as needed and all ADLs without fatigue.     Expected Outcomes  Short - continue to exrecise at home monitoring RPE and HR Long - improve overall fitness  Short - continue to walk at home and monitor vitals Long - maintain heart health  Short - continue to walk at home, add strength work when he can be supervised Long - maintain ability to do ADLs        Discharge Exercise Prescription (Final Exercise Prescription Changes): Exercise Prescription Changes - 08/09/18 1200      Response to Exercise   Blood Pressure (Admit)  122/70    Blood Pressure (Exercise)  150/70    Blood Pressure (Exit)  104/58    Heart Rate (Admit)  103 bpm    Heart Rate (Exercise)  122 bpm    Heart Rate (Exit)  108 bpm    Rating of Perceived Exertion (Exercise)  12    Symptoms  none    Comments  2nd day    Duration  Continue with 45 min of aerobic exercise without signs/symptoms of physical distress.    Intensity  THRR unchanged      Progression   Progression  Continue to progress workloads to maintain intensity without signs/symptoms of physical distress.      Resistance Training   Training Prescription  Yes    Weight  3 lb    Reps  10-15      Interval Training   Interval Training  No      Treadmill   MPH  2.3    Grade  1    Minutes  15    METs  3.08      Recumbant Bike   Level  2    RPM  60    Watts  25    Minutes  15    METs  3      NuStep   Level  3    SPM  80    Minutes  15    METs  2.7       Nutrition:  Target Goals: Understanding of nutrition guidelines, daily intake of sodium '1500mg'$ , cholesterol '200mg'$ , calories 30% from fat and 7% or  less from saturated fats, daily to have 5 or more servings of fruits and vegetables.  Biometrics:    Nutrition Therapy Plan and Nutrition Goals:   Nutrition Assessments:   Nutrition Goals Re-Evaluation:   Nutrition Goals Discharge (Final Nutrition Goals Re-Evaluation):   Psychosocial: Target Goals: Acknowledge presence or absence of significant depression and/or stress, maximize coping skills, provide positive support system. Participant is able to verbalize types and ability to use techniques and skills needed for reducing stress and depression.  Initial Review & Psychosocial Screening:   Quality of Life Scores:   Scores of 19 and below usually indicate a poorer quality of life in these areas.  A difference of  2-3 points is a clinically meaningful difference.  A difference of 2-3 points in the total score of the Quality of Life Index has been associated with significant improvement in overall quality of life, self-image, physical symptoms, and general health in studies assessing change in quality of life.  PHQ-9: Recent Review Flowsheet Data    Depression screen [facility] 2/9 01/05/2019 08/10/2018 07/28/2018 07/12/2018 03/30/2018   Decreased Interest 0 0 1 0 1   Down, Depressed, Hopeless 0 0 1 0 1   PHQ - 2 Score 0 0 2 0 2   Altered sleeping - - 3 - 1   Tired, decreased energy - - 2 - 1   Change in appetite - - 1 - 1   Feeling bad or failure about yourself  - - 1 - 1   Trouble concentrating - - 0 - 1   Moving slowly or fidgety/restless - - 0 - 1   Suicidal thoughts - - 0 - 1   PHQ-9 Score - - 9 - 9   Difficult doing work/chores - - Somewhat difficult - -     Interpretation of Total Score  Total Score Depression Severity:  1-4 = Minimal depression, 5-9 = Mild depression, 10-14 = Moderate depression, 15-19 = Moderately severe depression, 20-27 = Severe depression   Psychosocial Evaluation and Intervention: Psychosocial Evaluation - 08/15/18 1058      Psychosocial Evaluation &  Interventions   Interventions  Encouraged to exercise with the program and follow exercise prescription    Comments  Counselor contacted patient to conduct initial psychological assessment.  Patient shared thoughts and feelings related to multiple surgeries he had between November and December 2019.  He reported that he has been on antibiotics since first surgery and the medication has impacted how he is able to eat as the extended use of antibiotics has led to candida.  Patient does have several other comorbidities which he feels are well managed.  Sleep is an issue and takes trazadone each night.  He reports that sleep has improved in the last month.  No stressors to report at present.  Patient stated that he felt some depression after the infections which led to December surgeries but feels that has lifted.  Noted some anxiety but self-manges with positive outlook..  Patient likes to travel, has family in [place] and in [place].    Expected Outcomes  Short term goal: doing videos every other day, in-home exercise machine daily; long-term goal: get back to how he felt before surgeries, be able to travel and maintain lawn    Continue Psychosocial Services   Follow up required by staff       Psychosocial Re-Evaluation:   Psychosocial Discharge (Final Psychosocial Re-Evaluation):   Vocational Rehabilitation: Provide vocational rehab assistance to qualifying candidates.   Vocational Rehab Evaluation & Intervention:   Education: Education Goals: Education classes will be provided on a variety of topics geared toward better understanding of heart health and risk factor modification. Participant will state understanding/return demonstration of topics presented as noted by education test scores.  Learning Barriers/Preferences:   Education Topics:  AED/CPR: - Group verbal and written instruction with the use of models to demonstrate the basic use of the AED with the basic ABC's of  resuscitation.   General Nutrition Guidelines/Fats and Fiber: -  Group instruction provided by verbal, written material, models and posters to present the general guidelines for heart healthy nutrition. Gives an explanation and review of dietary fats and fiber.   Controlling Sodium/Reading Food Labels: -Group verbal and written material supporting the discussion of sodium use in heart healthy nutrition. Review and explanation with models, verbal and written materials for utilization of the food label.   Cardiac Rehab from 08/03/2018 in [facility] Cardiac and Pulmonary Rehab  Date  08/03/18  Educator  [facility]  Instruction Review Code  1- Verbalizes Understanding      Exercise Physiology & General Exercise Guidelines: - Group verbal and written instruction with models to review the exercise physiology of the cardiovascular system and associated critical values. Provides general exercise guidelines with specific guidelines to those with heart or lung disease.    Aerobic Exercise & Resistance Training: - Gives group verbal and written instruction on the various components of exercise. Focuses on aerobic and resistive training programs and the benefits of this training and how to safely progress through these programs..   Flexibility, Balance, Mind/Body Relaxation: Provides group verbal/written instruction on the benefits of flexibility and balance training, including mind/body exercise modes such as yoga, pilates and tai chi.  Demonstration and skill practice provided.   Stress and Anxiety: - Provides group verbal and written instruction about the health risks of elevated stress and causes of high stress.  Discuss the correlation between heart/lung disease and anxiety and treatment options. Review healthy ways to manage with stress and anxiety.   Depression: - Provides group verbal and written instruction on the correlation between heart/lung disease and depressed mood, treatment options, and the  stigmas associated with seeking treatment.   Anatomy & Physiology of the Heart: - Group verbal and written instruction and models provide basic cardiac anatomy and physiology, with the coronary electrical and arterial systems. Review of Valvular disease and Heart Failure   Cardiac Procedures: - Group verbal and written instruction to review commonly prescribed medications for heart disease. Reviews the medication, class of the drug, and side effects. Includes the steps to properly store meds and maintain the prescription regimen. (beta blockers and nitrates)   Cardiac Medications I: - Group verbal and written instruction to review commonly prescribed medications for heart disease. Reviews the medication, class of the drug, and side effects. Includes the steps to properly store meds and maintain the prescription regimen.   Cardiac Medications II: -Group verbal and written instruction to review commonly prescribed medications for heart disease. Reviews the medication, class of the drug, and side effects. (all other drug classes)    Go Sex-Intimacy & Heart Disease, Get SMART - Goal Setting: - Group verbal and written instruction through game format to discuss heart disease and the return to sexual intimacy. Provides group verbal and written material to discuss and apply goal setting through the application of the S.M.A.R.T. Method.   Other Matters of the Heart: - Provides group verbal, written materials and models to describe Stable Angina and Peripheral Artery. Includes description of the disease process and treatment options available to the cardiac patient.   Exercise & Equipment Safety: - Individual verbal instruction and demonstration of equipment use and safety with use of the equipment.   Cardiac Rehab from 08/03/2018 in [facility] Cardiac and Pulmonary Rehab  Date  07/28/18  Educator  [facility]  Instruction Review Code  1- Verbalizes Understanding      Infection Prevention: - Provides  verbal and written material to individual with discussion of infection  control including proper hand washing and proper equipment cleaning during exercise session.   Cardiac Rehab from 08/03/2018 in [facility] Cardiac and Pulmonary Rehab  Date  07/28/18  Educator  [facility]  Instruction Review Code  1- Verbalizes Understanding      Falls Prevention: - Provides verbal and written material to individual with discussion of falls prevention and safety.   Cardiac Rehab from 08/03/2018 in [facility] Cardiac and Pulmonary Rehab  Date  07/28/18  Educator  mc  Instruction Review Code  1- Verbalizes Understanding      Diabetes: - Individual verbal and written instruction to review signs/symptoms of diabetes, desired ranges of glucose level fasting, after meals and with exercise. Acknowledge that pre and post exercise glucose checks will be done for 3 sessions at entry of program.   Know Your Numbers and Risk Factors: -Group verbal and written instruction about important numbers in your health.  Discussion of what are risk factors and how they play a role in the disease process.  Review of Cholesterol, Blood Pressure, Diabetes, and BMI and the role they play in your overall health.   Sleep Hygiene: -Provides group verbal and written instruction about how sleep can affect your health.  Define sleep hygiene, discuss sleep cycles and impact of sleep habits. Review good sleep hygiene tips.    Other: -Provides group and verbal instruction on various topics (see comments)   Knowledge Questionnaire Score:   Core Components/Risk Factors/Patient Goals at Admission:   Core Components/Risk Factors/Patient Goals Review:  Goals and Risk Factor Review    Row Name 08/30/18 1057 09/16/18 1058           Core Components/Risk Factors/Patient Goals Review   Personal Goals Review  Weight Management/Obesity;Lipids;Hypertension;Stress  Heart Failure;Lipids;Hypertension      Review  Dale Mcdonald has been taking meds as directed.   He sees his cardiologist May 1 and will talk with him about sleeping.  [name]  [name]    [name]  Dale Mcdonald had a fever and saw his Dr at [facility] as it was an area of possible infection.  He had surgery for this.  [name] ( 10 lb weight restriction) [name]      Expected Outcomes  Short - continue to exercise at home Long - improve overall fitness  Short - follow up with Dr Monday Long - continue Dr plan of care          Core Components/Risk Factors/Patient Goals at Discharge (Final Review):  Goals and Risk Factor Review - 09/16/18 1058      Core Components/Risk Factors/Patient Goals Review   Personal Goals Review  Heart Failure;Lipids;Hypertension    Review  Dale Mcdonald had a fever and saw his Dr at [facility] as it was an area of possible infection.  He had surgery for this.  [name] ( 10 lb weight restriction) [name]    Expected Outcomes  Short - follow up with Dr Monday Long - continue Dr plan of care        ITP Comments: ITP Comments    Row Name 08/12/18 [phone] 08/17/18 1327 10/27/18 1351 12/07/18 1247 02/01/19 1437   ITP  Comments  Our program is currently closed due to COVID-19.  We are communicating with patient via phone calls and emails.   30 day review completed. Continue with ITP unless directed changes by Medical Director at review.  His Dr changed med from ACE inhibitor to Trinity.  His EF has dropped since before surgery  30 day review cycle restarting  after being closed since March 16 because of  Covid 19 pandemic. Program opened to patients on July 6. Not all have returned. ITP updated and sent to Medical Director for review,changes as needed and signature  Pt never returned once we reopened after  the COVID-19 closure.  Discharge ITP and  summary created and sent for review.      Comments: Discharge ITP

## 2019-02-14 DIAGNOSIS — I2583 Coronary atherosclerosis due to lipid rich plaque: Secondary | ICD-10-CM | POA: Diagnosis not present

## 2019-02-14 DIAGNOSIS — I6523 Occlusion and stenosis of bilateral carotid arteries: Secondary | ICD-10-CM | POA: Diagnosis not present

## 2019-02-14 DIAGNOSIS — I517 Cardiomegaly: Secondary | ICD-10-CM | POA: Diagnosis not present

## 2019-02-14 DIAGNOSIS — I2510 Atherosclerotic heart disease of native coronary artery without angina pectoris: Secondary | ICD-10-CM | POA: Diagnosis not present

## 2019-02-14 DIAGNOSIS — I10 Essential (primary) hypertension: Secondary | ICD-10-CM | POA: Diagnosis not present

## 2019-02-14 DIAGNOSIS — I340 Nonrheumatic mitral (valve) insufficiency: Secondary | ICD-10-CM | POA: Diagnosis not present

## 2019-02-14 DIAGNOSIS — E782 Mixed hyperlipidemia: Secondary | ICD-10-CM | POA: Diagnosis not present

## 2019-02-14 DIAGNOSIS — I4819 Other persistent atrial fibrillation: Secondary | ICD-10-CM | POA: Diagnosis not present

## 2019-02-23 DIAGNOSIS — I2510 Atherosclerotic heart disease of native coronary artery without angina pectoris: Secondary | ICD-10-CM | POA: Diagnosis not present

## 2019-02-23 DIAGNOSIS — I2583 Coronary atherosclerosis due to lipid rich plaque: Secondary | ICD-10-CM | POA: Diagnosis not present

## 2019-03-08 DIAGNOSIS — L739 Follicular disorder, unspecified: Secondary | ICD-10-CM | POA: Diagnosis not present

## 2019-03-11 ENCOUNTER — Other Ambulatory Visit: Payer: Self-pay | Admitting: Internal Medicine

## 2019-03-30 ENCOUNTER — Other Ambulatory Visit: Payer: Self-pay

## 2019-03-30 DIAGNOSIS — Z79899 Other long term (current) drug therapy: Secondary | ICD-10-CM

## 2019-03-30 DIAGNOSIS — Z113 Encounter for screening for infections with a predominantly sexual mode of transmission: Secondary | ICD-10-CM

## 2019-03-30 DIAGNOSIS — B20 Human immunodeficiency virus [HIV] disease: Secondary | ICD-10-CM

## 2019-03-31 ENCOUNTER — Other Ambulatory Visit: Payer: Medicare Other

## 2019-03-31 ENCOUNTER — Other Ambulatory Visit (HOSPITAL_COMMUNITY)
Admission: RE | Admit: 2019-03-31 | Discharge: 2019-03-31 | Disposition: A | Discharge status: Home / Self Care | Payer: Medicare Other | Source: Ambulatory Visit | Attending: Infectious Disease | Admitting: Infectious Disease

## 2019-03-31 ENCOUNTER — Other Ambulatory Visit: Payer: Self-pay

## 2019-03-31 DIAGNOSIS — B20 Human immunodeficiency virus [HIV] disease: Secondary | ICD-10-CM | POA: Insufficient documentation

## 2019-03-31 DIAGNOSIS — M869 Osteomyelitis, unspecified: Secondary | ICD-10-CM | POA: Diagnosis not present

## 2019-03-31 DIAGNOSIS — Z79899 Other long term (current) drug therapy: Secondary | ICD-10-CM | POA: Diagnosis not present

## 2019-03-31 DIAGNOSIS — I10 Essential (primary) hypertension: Secondary | ICD-10-CM | POA: Diagnosis not present

## 2019-03-31 DIAGNOSIS — E785 Hyperlipidemia, unspecified: Secondary | ICD-10-CM | POA: Diagnosis not present

## 2019-03-31 LAB — T-HELPER CELL (CD4) - (RCID CLINIC ONLY)
CD4 % Helper T Cell: 39 % (ref 33–65)
CD4 T Cell Abs: 646 /uL (ref 400–1790)

## 2019-03-31 LAB — CBC WITH DIFFERENTIAL/PLATELET
Absolute Monocytes: 465 cells/uL (ref 200–950)
Basophils Absolute: 28 cells/uL (ref 0–200)
Basophils Relative: 0.6 %
Eosinophils Absolute: 69 cells/uL (ref 15–500)
Eosinophils Relative: 1.5 %
HCT: 38.8 % (ref 38.5–50.0)
Hemoglobin: 12.8 g/dL — ABNORMAL LOW (ref 13.2–17.1)
Lymphs Abs: 1509 cells/uL (ref 850–3900)
MCH: 30.2 pg (ref 27.0–33.0)
MCHC: 33 g/dL (ref 32.0–36.0)
MCV: 91.5 fL (ref 80.0–100.0)
MPV: 10.7 fL (ref 7.5–12.5)
Monocytes Relative: 10.1 %
Neutro Abs: 2530 cells/uL (ref 1500–7800)
Neutrophils Relative %: 55 %
Platelets: 170 10*3/uL (ref 140–400)
RBC: 4.24 10*6/uL (ref 4.20–5.80)
RDW: 14.9 % (ref 11.0–15.0)
Total Lymphocyte: 32.8 %
WBC: 4.6 10*3/uL (ref 3.8–10.8)

## 2019-03-31 NOTE — Addendum Note (Signed)
Addended byMeriel Pica F on: 03/31/2019 09:08 AM   Modules accepted: Orders

## 2019-04-03 ENCOUNTER — Telehealth: Payer: Self-pay | Admitting: *Deleted

## 2019-04-03 LAB — URINE CYTOLOGY ANCILLARY ONLY
Chlamydia: NEGATIVE
Comment: NEGATIVE
Comment: NORMAL
Neisseria Gonorrhea: NEGATIVE

## 2019-04-03 NOTE — Telephone Encounter (Signed)
Okay thanks so much Michelle 

## 2019-04-03 NOTE — Telephone Encounter (Signed)
RN reached out to patient. He said he has been having heart issues, is now on 3 diuretics.  He states that he was "probably a little dry" when he came in for labs.  RN updated medication list (added spironolactone 25 mg, confirmed dosage of torsemide and entresto). He has already forwarded his labs on to his cardiologist at [facility] (Dr Venetia Maxon).  He said his creatinine has actually dropped a bit since his last cardiology labs (1.72 ->1.67).   He also added fluconazole per his dermatologist for "bumpy, itchy fungal rash" present for several years that has spread from his scalp down his back. He sees dermatology this week. Landis Gandy, RN

## 2019-04-03 NOTE — Telephone Encounter (Signed)
-----   Message from Truman Hayward, MD sent at 04/02/2019 10:11 AM EST ----- Dale Mcdonald's Cr is abruptly elevated to 1.67  Is he potentially dehydrated? I would recommend he DC his entrestro and see PCP for evaluation. It is NOT at all likely due to TAF in [place]

## 2019-04-05 DIAGNOSIS — L739 Follicular disorder, unspecified: Secondary | ICD-10-CM | POA: Diagnosis not present

## 2019-04-06 LAB — LIPID PANEL
Cholesterol: 149 mg/dL (ref <200)
HDL: 53 mg/dL (ref >=40)
LDL Cholesterol (Calc): 77 mg/dL (calc)
Non-HDL Cholesterol (Calc): 96 mg/dL (calc) (ref <130)
Total CHOL/HDL Ratio: 2.8 (calc) (ref <5.0)
Triglycerides: 100 mg/dL (ref <150)

## 2019-04-06 LAB — COMPREHENSIVE METABOLIC PANEL
AG Ratio: 1.4 (calc) (ref 1.0–2.5)
ALT: 34 U/L (ref 9–46)
AST: 29 U/L (ref 10–35)
Albumin: 4.6 g/dL (ref 3.6–5.1)
Alkaline phosphatase (APISO): 122 U/L (ref 35–144)
BUN/Creatinine Ratio: 16 (calc) (ref 6–22)
BUN: 27 mg/dL — ABNORMAL HIGH (ref 7–25)
CO2: 28 mmol/L (ref 20–32)
Calcium: 9.9 mg/dL (ref 8.6–10.3)
Chloride: 99 mmol/L (ref 98–110)
Creat: 1.67 mg/dL — ABNORMAL HIGH (ref 0.70–1.25)
Globulin: 3.2 g/dL (calc) (ref 1.9–3.7)
Glucose, Bld: 119 mg/dL — ABNORMAL HIGH (ref 65–99)
Potassium: 4.7 mmol/L (ref 3.5–5.3)
Sodium: 139 mmol/L (ref 135–146)
Total Bilirubin: 0.5 mg/dL (ref 0.2–1.2)
Total Protein: 7.8 g/dL (ref 6.1–8.1)

## 2019-04-06 LAB — HIV-1 RNA QUANT-NO REFLEX-BLD
HIV 1 RNA Quant: <20 copies/mL — AB
HIV-1 RNA Quant, Log: <1.3 Log copies/mL — AB

## 2019-04-06 LAB — RPR: RPR Ser Ql: NONREACTIVE

## 2019-04-10 ENCOUNTER — Other Ambulatory Visit: Payer: Self-pay | Admitting: Family

## 2019-04-10 DIAGNOSIS — B20 Human immunodeficiency virus [HIV] disease: Secondary | ICD-10-CM

## 2019-04-17 ENCOUNTER — Encounter: Payer: Self-pay | Admitting: Infectious Disease

## 2019-04-17 ENCOUNTER — Other Ambulatory Visit: Payer: Self-pay

## 2019-04-17 ENCOUNTER — Ambulatory Visit: Payer: Medicare Other | Admitting: Infectious Disease

## 2019-04-17 VITALS — BP 104/68 | HR 89 | Temp 98.0°F | Wt 182.0 lb

## 2019-04-17 DIAGNOSIS — M869 Osteomyelitis, unspecified: Secondary | ICD-10-CM

## 2019-04-17 DIAGNOSIS — I340 Nonrheumatic mitral (valve) insufficiency: Secondary | ICD-10-CM

## 2019-04-17 DIAGNOSIS — B181 Chronic viral hepatitis B without delta-agent: Secondary | ICD-10-CM

## 2019-04-17 DIAGNOSIS — F17200 Nicotine dependence, unspecified, uncomplicated: Secondary | ICD-10-CM

## 2019-04-17 DIAGNOSIS — B20 Human immunodeficiency virus [HIV] disease: Secondary | ICD-10-CM | POA: Diagnosis not present

## 2019-04-17 DIAGNOSIS — I10 Essential (primary) hypertension: Secondary | ICD-10-CM | POA: Diagnosis not present

## 2019-04-17 MED ORDER — BIKTARVY 50-200-25 MG PO TABS: 1.0000 | ORAL_TABLET | Freq: Every day | ORAL | 11 refills | Status: DC

## 2019-04-17 NOTE — Progress Notes (Signed)
Subjective:   Chief complaint: He has noticed a sensation under his skin that he associates with initiating TIVICAY DESCOVY now several years ago  Patient ID: Dale Mcdonald, male    DOB: 1950-11-02, 68 y.o.   MRN: [id number]  HPI  68 year old male who is doing superbly well on his antiviral regimen of Atripla to which he had been changed from his prior regimen of [place]z Norvir Truvada which he had received through AIDS clinical trials group 5257 to Atripla.  His been enrolled in HALO study in the ACTG.  We tried to change him to [place] but his insurance would not cover this. He believes his insurance will now covert his. We considered change to [facility] but there are signficant drug drug interactions due to COBI so he was in the end agreeable to going with Tivicay and Descovy with consideration of Biktarvy down the road.  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  He says that his chest infection is completely resolved and he showed me his wound which is looks to be nicely healed.  There is no pain or tenderness there or any suggestion of infection.  He had seen Terri Piedra in the interim last spring and had some inflammatory markers checked at that time but has had improvement since then and has been off antibiotics since roughly June he tells me.  He is following up at [facility] with cardiology.  He did have a bump in his serum creatinine relative to our labs but apparently has had elevated creatinines at [facility] and they are following these labs more closely.  He is interested in a switch to [place] but says he has a years worth of TIVICAY DESCOVY in the interim.  He is smoking e-cigarettes but not conventional tobacco  Past Medical  History:  Diagnosis Date  . Anxiety   . Anxiety 03/29/2017  . Heart murmur   . Hepatitis    B  . HIV (human immunodeficiency virus infection) ([place])   . Hyperlipidemia   . Hypertension   . Palpitations     Past Surgical History:  Procedure Laterality Date  . CATARACT EXTRACTION W/PHACO Left 11/11/2017   Procedure: CATARACT EXTRACTION PHACO AND INTRAOCULAR LENS PLACEMENT (IOC);  Surgeon: Birder Robson, MD;  Location: [facility] ORS;  Service: Ophthalmology;  Laterality: Left;  [place] 00:59.3 AP% 19.5 CDE 11.54 Fluid pack lot # [id number]  . CATARACT EXTRACTION W/PHACO Right 12/21/2017   Procedure: CATARACT EXTRACTION PHACO AND INTRAOCULAR LENS PLACEMENT (IOC);  Surgeon: Birder Robson, MD;  Location: [facility] ORS;  Service: Ophthalmology;  Laterality: Right;  [place] 00:40 AP% 13.5 CDE 5.50 Fluid pack lot $ [id number]  . COLONOSCOPY    . COLONOSCOPY WITH PROPOFOL N/A 02/08/2018   Procedure: COLONOSCOPY WITH PROPOFOL;  Surgeon: Jonathon Bellows, MD;  Location: [facility] ENDOSCOPY;  Service: Gastroenterology;  Laterality: N/A;  . CORONARY ARTERY BYPASS GRAFT  04/07/2018  . EYE SURGERY     RETINA  . HERNIA REPAIR    . MITRAL VALVE REPLACEMENT  03/2018   bovine--with CABG    No family history on file.    Social History   Socioeconomic History  . Marital status: Widowed    Spouse name: Not on file  . Number of children: 1  . Years of  education: Not on file  . Highest education level: Not on file  Occupational History  . Occupation: VICE PRESIDENT--Purchasing---retired    Comment: [employer]  . Financial resource strain: Not on file  . Food insecurity    Worry: Not on file    Inability: Not on file  . Transportation needs    Medical: Not on file    Non-medical: Not on file  Tobacco Use  . Smoking status: Former Smoker    Packs/day: 2.00    Years: 40.00    Pack years: 80.00    Types: Cigarettes  . Smokeless tobacco: Never Used  . Tobacco comment: Quit  05/25/2017  Substance and Sexual Activity  . Alcohol use: No    Alcohol/week: 0.0 standard drinks  . Drug use: No  . Sexual activity: Not on file  Lifestyle  . Physical activity    Days per week: Not on file    Minutes per session: Not on file  . Stress: Not on file  Relationships  . Social Herbalist on phone: Not on file    Gets together: Not on file    Attends religious service: Not on file    Active member of club or organization: Not on file    Attends meetings of clubs or organizations: Not on file    Relationship status: Not on file  Other Topics Concern  . Not on file  Social History Narrative   No living will   Would want son [name] to be decision maker   Would accept resuscitation but no prolonged ventilation   Not sure about tube feeds    Allergies  Allergen Reactions  . Magnesium Nausea Only     Current Outpatient Medications:  .  amoxicillin (AMOXIL) 500 MG tablet, TAKE 4 TABLETS BY MOUTH 1 HOUR PRIOR TO DENTAL PROCEDURE, Disp: , Rfl:  .  aspirin EC 81 MG tablet, Take 81 mg by mouth daily., Disp: , Rfl:  .  atorvastatin (LIPITOR) 20 MG tablet, TAKE 1 TABLET BY MOUTH  DAILY, Disp: 90 tablet, Rfl: 3 .  DESCOVY 200-25 MG tablet, TAKE 1 TABLET BY MOUTH DAILY, Disp: 30 tablet, Rfl: 5 .  diphenhydrAMINE (BENADRYL) 25 mg capsule, Take by mouth., Disp: , Rfl:  .  ENTRESTO 49-51 MG, Take 1 tablet by mouth 2 (two) times daily., Disp: , Rfl:  .  fluconazole (DIFLUCAN) 150 MG tablet, Take 150 mg by mouth once a week. prn, Disp: , Rfl:  .  Melatonin 5 MG TABS, Take 5 mg by mouth at bedtime. , Disp: , Rfl:  .  Multiple Vitamin (MULTI-VITAMIN) tablet, Take by mouth., Disp: , Rfl:  .  Multiple Vitamins-Minerals (MULTIVITAMIN ADULT PO), Take by mouth., Disp: , Rfl:  .  nystatin (MYCOSTATIN) 100000 UNIT/ML suspension, Take 5 mLs by mouth 4 (four) times daily., Disp: , Rfl:  .  pantoprazole (PROTONIX) 40 MG tablet, Take 1 tablet by mouth daily., Disp: , Rfl:  .   senna-docusate (SENOKOT-S) 8.6-50 MG tablet, Take by mouth., Disp: , Rfl:  .  spironolactone (ALDACTONE) 25 MG tablet, Take 25 mg by mouth daily., Disp: , Rfl:  .  TIVICAY 50 MG tablet, TAKE 1 TABLET(50 MG) BY MOUTH DAILY, Disp: 30 tablet, Rfl: 5 .  torsemide (DEMADEX) 20 MG tablet, 1-2 tabs daily as needed, Disp: , Rfl:  .  traZODone (DESYREL) 50 MG tablet, TAKE 1-2 TABLETS (50-100 MG TOTAL) BY MOUTH AT BEDTIME., Disp: 180 tablet, Rfl:  3 .  valACYclovir (VALTREX) 500 MG tablet, Take 1 tablet (500 mg total) by mouth daily as needed (for fever blisters.)., Disp: 30 tablet, Rfl: 5     Review of Systems  Constitutional: Negative for activity change, appetite change, chills, diaphoresis, fatigue and fever.  HENT: Negative for congestion, rhinorrhea, sinus pressure, sneezing, sore throat and trouble swallowing.   Eyes: Negative for photophobia and visual disturbance.  Respiratory: Negative for cough, chest tightness, shortness of breath, wheezing and stridor.   Cardiovascular: Negative for chest pain, palpitations and leg swelling.  Gastrointestinal: Negative for abdominal distention, abdominal pain, anal bleeding, blood in stool, diarrhea, nausea and vomiting.  Genitourinary: Negative for difficulty urinating, dysuria, flank pain and hematuria.  Musculoskeletal: Negative for arthralgias, back pain, gait problem, joint swelling and myalgias.  Skin: Positive for color change. Negative for pallor, rash and wound.  Neurological: Negative for dizziness, tremors, weakness and light-headedness.  Hematological: Negative for adenopathy. Does not bruise/bleed easily.  Psychiatric/Behavioral: Negative for agitation, behavioral problems, confusion, decreased concentration, dysphoric mood and sleep disturbance.       Objective:   Physical Exam  Constitutional: He is oriented to person, place, and time. He appears well-developed and well-nourished. No distress.  HENT:  Head: Normocephalic and atraumatic.   Mouth/Throat: Oropharynx is clear and moist. No oropharyngeal exudate.  Eyes: Pupils are equal, round, and reactive to light. Conjunctivae and EOM are normal. No scleral icterus.  Neck: Normal range of motion. Neck supple. No JVD present.  Cardiovascular: Normal rate and regular rhythm.  Pulmonary/Chest: Effort normal. No respiratory distress. He has no wheezes. He exhibits no tenderness.  Abdominal: He exhibits no distension.  Musculoskeletal:        General: No tenderness or edema.  Lymphadenopathy:    He has no cervical adenopathy.  Neurological: He is alert and oriented to person, place, and time. He has normal reflexes. He exhibits normal muscle tone. Coordination normal.  Skin: Skin is warm and dry. He is not diaphoretic. No erythema. No pallor.  Psychiatric: He has a normal mood and affect. His behavior is normal. Judgment and thought content normal.          Assessment & Plan:   HIV: continue  Tivicay and Descovy, when he is running out of these meds we can switch him to STR, Biktarvy and this may be in one years time.  Chronic Hepatitis B without hepatic coma or delta agent: non TAF and does need US abdomen periodically  Sternal osteomyelitis: appears resolved  Hyperlipidemia continue statin.  CAD: being followed by [facility]   Hypertension:  PCP managing this   Smoking: proud of him for quitting conventional cigarettes

## 2019-04-25 DIAGNOSIS — I517 Cardiomegaly: Secondary | ICD-10-CM | POA: Diagnosis not present

## 2019-05-10 ENCOUNTER — Other Ambulatory Visit: Payer: Self-pay

## 2019-05-10 ENCOUNTER — Encounter (INDEPENDENT_AMBULATORY_CARE_PROVIDER_SITE_OTHER): Payer: Medicare Other | Admitting: *Deleted

## 2019-05-10 VITALS — BP 111/69 | HR 76 | Temp 98.4°F | Wt 187.5 lb

## 2019-05-10 DIAGNOSIS — Z006 Encounter for examination for normal comparison and control in clinical research program: Secondary | ICD-10-CM

## 2019-05-10 NOTE — Research (Signed)
Dale Mcdonald was here for his [id number] visit for The HAILO Study: A Long Term follow-up of Older HIV-Infected Adults in the ACTG, an observational study addressing the issues of aging, HIV infection and Inflammation. He has had quite a year with several hospitalizations and major problems. He has mostly recuperated but still having some fatigue and endurance issues, but otherwise doing well. He will be returning in July for the next study visit.

## 2019-05-11 LAB — COMPREHENSIVE METABOLIC PANEL
AG Ratio: 1.6 (calc) (ref 1.0–2.5)
ALT: 35 U/L (ref 9–46)
AST: 27 U/L (ref 10–35)
Albumin: 4.5 g/dL (ref 3.6–5.1)
Alkaline phosphatase (APISO): 99 U/L (ref 35–144)
BUN/Creatinine Ratio: 15 (calc) (ref 6–22)
BUN: 25 mg/dL (ref 7–25)
CO2: 27 mmol/L (ref 20–32)
Calcium: 9.7 mg/dL (ref 8.6–10.3)
Chloride: 104 mmol/L (ref 98–110)
Creat: 1.67 mg/dL — ABNORMAL HIGH (ref 0.70–1.25)
Globulin: 2.9 g/dL (calc) (ref 1.9–3.7)
Glucose, Bld: 118 mg/dL — ABNORMAL HIGH (ref 65–99)
Potassium: 4.6 mmol/L (ref 3.5–5.3)
Sodium: 140 mmol/L (ref 135–146)
Total Bilirubin: 0.4 mg/dL (ref 0.2–1.2)
Total Protein: 7.4 g/dL (ref 6.1–8.1)

## 2019-05-11 LAB — HEMOGLOBIN A1C
Hgb A1c MFr Bld: 6.2 % of total Hgb — ABNORMAL HIGH (ref <5.7)
Mean Plasma Glucose: 131 (calc)
eAG (mmol/L): 7.3 (calc)

## 2019-05-11 LAB — LIPID PANEL
Cholesterol: 140 mg/dL (ref <200)
HDL: 49 mg/dL (ref >=40)
LDL Cholesterol (Calc): 75 mg/dL (calc)
Non-HDL Cholesterol (Calc): 91 mg/dL (calc) (ref <130)
Total CHOL/HDL Ratio: 2.9 (calc) (ref <5.0)
Triglycerides: 80 mg/dL (ref <150)

## 2019-05-11 LAB — PROTEIN, URINE, RANDOM: Total Protein, Urine: 26 mg/dL — ABNORMAL HIGH (ref 5–25)

## 2019-05-11 LAB — CREATININE, URINE, RANDOM: Creatinine, Urine: 129 mg/dL (ref 20–320)

## 2019-06-09 ENCOUNTER — Encounter: Payer: Self-pay | Admitting: Infectious Disease

## 2019-06-09 LAB — HIV-1 RNA QUANT-NO REFLEX-BLD
CD4 Count: 720
CD4%: 37.9
CD8 % Suppressor T Cell: 43.1
CD8 T Cell Abs: 819

## 2019-06-20 ENCOUNTER — Ambulatory Visit: Payer: Medicare Other

## 2019-06-29 ENCOUNTER — Ambulatory Visit: Payer: Medicare Other

## 2019-07-26 DIAGNOSIS — M2669 Other specified disorders of temporomandibular joint: Secondary | ICD-10-CM | POA: Diagnosis not present

## 2019-08-24 DIAGNOSIS — H35371 Puckering of macula, right eye: Secondary | ICD-10-CM | POA: Diagnosis not present

## 2019-09-06 DIAGNOSIS — L739 Follicular disorder, unspecified: Secondary | ICD-10-CM | POA: Diagnosis not present

## 2019-09-06 DIAGNOSIS — L570 Actinic keratosis: Secondary | ICD-10-CM | POA: Diagnosis not present

## 2019-09-12 DIAGNOSIS — I4819 Other persistent atrial fibrillation: Secondary | ICD-10-CM | POA: Diagnosis not present

## 2019-09-12 DIAGNOSIS — I5022 Chronic systolic (congestive) heart failure: Secondary | ICD-10-CM | POA: Diagnosis not present

## 2019-09-12 DIAGNOSIS — Z952 Presence of prosthetic heart valve: Secondary | ICD-10-CM | POA: Diagnosis not present

## 2019-09-12 DIAGNOSIS — I517 Cardiomegaly: Secondary | ICD-10-CM | POA: Diagnosis not present

## 2019-09-12 DIAGNOSIS — I10 Essential (primary) hypertension: Secondary | ICD-10-CM | POA: Diagnosis not present

## 2019-09-12 DIAGNOSIS — I2510 Atherosclerotic heart disease of native coronary artery without angina pectoris: Secondary | ICD-10-CM | POA: Diagnosis not present

## 2019-09-12 DIAGNOSIS — I459 Conduction disorder, unspecified: Secondary | ICD-10-CM | POA: Diagnosis not present

## 2019-09-12 DIAGNOSIS — R9431 Abnormal electrocardiogram [ECG] [EKG]: Secondary | ICD-10-CM | POA: Diagnosis not present

## 2019-09-12 DIAGNOSIS — I2583 Coronary atherosclerosis due to lipid rich plaque: Secondary | ICD-10-CM | POA: Diagnosis not present

## 2019-09-12 DIAGNOSIS — I081 Rheumatic disorders of both mitral and tricuspid valves: Secondary | ICD-10-CM | POA: Diagnosis not present

## 2019-10-24 DIAGNOSIS — B009 Herpesviral infection, unspecified: Secondary | ICD-10-CM

## 2019-10-24 MED ORDER — VALACYCLOVIR HCL 1 G PO TABS: 2000.0000 mg | ORAL_TABLET | Freq: Once | ORAL | 3 refills | Status: AC

## 2019-10-24 NOTE — Telephone Encounter (Signed)
Looks like Dr Drucilla Schmidt had been prescribing. Will forward to Dr Silvio Pate to approve.

## 2019-10-24 NOTE — Telephone Encounter (Signed)
Let him know that I recommend 2000mg  at onset of fever blister---and repeat in 12 hours

## 2019-10-24 NOTE — Telephone Encounter (Signed)
MyChart response sent to pt

## 2019-11-30 DIAGNOSIS — Z953 Presence of xenogenic heart valve: Secondary | ICD-10-CM | POA: Diagnosis not present

## 2019-11-30 DIAGNOSIS — I5022 Chronic systolic (congestive) heart failure: Secondary | ICD-10-CM | POA: Diagnosis not present

## 2019-11-30 DIAGNOSIS — I4819 Other persistent atrial fibrillation: Secondary | ICD-10-CM | POA: Diagnosis not present

## 2019-11-30 DIAGNOSIS — I2510 Atherosclerotic heart disease of native coronary artery without angina pectoris: Secondary | ICD-10-CM | POA: Diagnosis not present

## 2019-11-30 DIAGNOSIS — I480 Paroxysmal atrial fibrillation: Secondary | ICD-10-CM | POA: Diagnosis not present

## 2019-12-01 DIAGNOSIS — I480 Paroxysmal atrial fibrillation: Secondary | ICD-10-CM | POA: Diagnosis not present

## 2019-12-01 DIAGNOSIS — I2583 Coronary atherosclerosis due to lipid rich plaque: Secondary | ICD-10-CM | POA: Diagnosis not present

## 2019-12-01 DIAGNOSIS — I5022 Chronic systolic (congestive) heart failure: Secondary | ICD-10-CM | POA: Diagnosis not present

## 2019-12-01 DIAGNOSIS — I2510 Atherosclerotic heart disease of native coronary artery without angina pectoris: Secondary | ICD-10-CM | POA: Diagnosis not present

## 2019-12-01 DIAGNOSIS — I340 Nonrheumatic mitral (valve) insufficiency: Secondary | ICD-10-CM | POA: Diagnosis not present

## 2019-12-01 DIAGNOSIS — I4819 Other persistent atrial fibrillation: Secondary | ICD-10-CM | POA: Diagnosis not present

## 2019-12-05 ENCOUNTER — Encounter: Payer: Self-pay | Admitting: Infectious Disease

## 2019-12-05 ENCOUNTER — Encounter (INDEPENDENT_AMBULATORY_CARE_PROVIDER_SITE_OTHER): Payer: Medicare Other | Admitting: *Deleted

## 2019-12-05 ENCOUNTER — Other Ambulatory Visit: Payer: Self-pay

## 2019-12-05 VITALS — BP 130/73 | HR 80 | Temp 97.9°F | Ht 70.0 in | Wt 186.1 lb

## 2019-12-05 DIAGNOSIS — I480 Paroxysmal atrial fibrillation: Secondary | ICD-10-CM | POA: Diagnosis not present

## 2019-12-05 DIAGNOSIS — Z006 Encounter for examination for normal comparison and control in clinical research program: Secondary | ICD-10-CM

## 2019-12-05 NOTE — Research (Signed)
Dale Mcdonald was here for his final visit for the [place] study. He has had a recent episode of his heart fluttering that woke him up in the middle of the night and has been evaluated by his cardiologist, questionable paroxysmal atrial fibrillation. ECG in their office was NSR with prolonged QT. He is actually doing very well at taking care of himself considering his heart condition and is able to do yard work. He will be following up with Dr. Tommy Medal in the Fall.

## 2019-12-06 LAB — LIPID PANEL
Cholesterol: 136 mg/dL (ref <200)
HDL: 42 mg/dL (ref >=40)
LDL Cholesterol (Calc): 75 mg/dL (calc)
Non-HDL Cholesterol (Calc): 94 mg/dL (calc) (ref <130)
Total CHOL/HDL Ratio: 3.2 (calc) (ref <5.0)
Triglycerides: 109 mg/dL (ref <150)

## 2019-12-06 LAB — COMPREHENSIVE METABOLIC PANEL
AG Ratio: 1.4 (calc) (ref 1.0–2.5)
ALT: 25 U/L (ref 9–46)
AST: 25 U/L (ref 10–35)
Albumin: 4.4 g/dL (ref 3.6–5.1)
Alkaline phosphatase (APISO): 133 U/L (ref 35–144)
BUN/Creatinine Ratio: 15 (calc) (ref 6–22)
BUN: 28 mg/dL — ABNORMAL HIGH (ref 7–25)
CO2: 24 mmol/L (ref 20–32)
Calcium: 9.7 mg/dL (ref 8.6–10.3)
Chloride: 97 mmol/L — ABNORMAL LOW (ref 98–110)
Creat: 1.93 mg/dL — ABNORMAL HIGH (ref 0.70–1.25)
Globulin: 3.2 g/dL (calc) (ref 1.9–3.7)
Glucose, Bld: 113 mg/dL — ABNORMAL HIGH (ref 65–99)
Potassium: 4.7 mmol/L (ref 3.5–5.3)
Sodium: 134 mmol/L — ABNORMAL LOW (ref 135–146)
Total Bilirubin: 0.6 mg/dL (ref 0.2–1.2)
Total Protein: 7.6 g/dL (ref 6.1–8.1)

## 2019-12-06 LAB — PROTEIN / CREATININE RATIO, URINE
Creatinine, Urine: 37 mg/dL (ref 20–320)
Total Protein, Urine: <4 mg/dL — ABNORMAL LOW (ref 5–25)

## 2019-12-06 LAB — HEPATITIS C ANTIBODY
Hepatitis C Ab: NONREACTIVE
SIGNAL TO CUT-OFF: 0.02 (ref <1.00)

## 2019-12-11 ENCOUNTER — Encounter: Payer: Self-pay | Admitting: Family Medicine

## 2019-12-11 ENCOUNTER — Ambulatory Visit (INDEPENDENT_AMBULATORY_CARE_PROVIDER_SITE_OTHER): Payer: Medicare Other | Admitting: Family Medicine

## 2019-12-11 VITALS — BP 112/64 | HR 94 | Temp 98.0°F | Ht 70.0 in | Wt 186.6 lb

## 2019-12-11 DIAGNOSIS — M25572 Pain in left ankle and joints of left foot: Secondary | ICD-10-CM | POA: Diagnosis not present

## 2019-12-11 MED ORDER — PREDNISONE 20 MG PO TABS: ORAL_TABLET | ORAL | 0 refills | Status: DC

## 2019-12-11 NOTE — Progress Notes (Signed)
This visit was conducted in person.  BP 112/64 (BP Location: Left Arm, Patient Position: Sitting, Cuff Size: Normal)   Pulse 94   Temp 98 F (36.7 C) (Temporal)   Ht _0  (1.778 m)   Wt 186 lb 9 oz (84.6 kg)   SpO2 97%   BMI 26.77 kg/m    CC: foot pain Subjective:    Patient ID: Dale Mcdonald, male    DOB: [birth year], 69 y.o.   MRN: [id number]  HPI: Dale Mcdonald is a 69 y.o. male presenting on 12/11/2019 for Joint Swelling (C/o left ankle/foot swelling.  Started about 10 days ago.  Thinks it may be gout. )   Recent car ride up to [place] to visit son (total 16 hours each way) - during 4th of July weekend.  Did notice some swelling of feet during the trip.  Saw cardiology 11/30/2019. Had CP stress test - overall good report.   Noticed warm red tender swelling to anterior ankle of left foot after cardiology appointment (on 12/01/2019). Tried increasing torsemide to 10m daily without benefit. Friend told him he thought he may have had gout. Has been on his feet more today than normal. Has tried compression stockings - very painful when going over anterior ankle.  No known insect bites. No inciting trauma/injury No recent fevers/chills, nausea.   Has been managing with ibuprofen 6053m1-2 times a day with benefit. Also took tylenol today with benefit when combined with NSAID. Recent bump in Cr.   Does have h/o heart disease (HFrEF), had valve replacement 1176/1950omplicated by MRSA 1293/2671 Normally on torsemide 2028maily.   No known h/o gout.  Had 1 beer last week (normally doesn't drink beer). No increase in shrimp or shellfish recently. He has recently been eating more bacon&cheddar hamburgers than normal (3 a week).  With concern over gout, he started eating more cherries - possible benefit.   HIV on descovy and tivicay.  No h/o DM.  No personal h/o blood clots.  [facility] to walk for 1.5 miles on treadmill. Today foot pain/swelling is  worse.   Upcoming [place] cruise in September.      Relevant past medical, surgical, family and social history reviewed and updated as indicated. Interim medical history since our last visit reviewed. Allergies and medications reviewed and updated. Outpatient Medications Prior to Visit  Medication Sig Dispense Refill  . aspirin EC 81 MG tablet Take 81 mg by mouth daily.    . aMarland Kitchenorvastatin (LIPITOR) 20 MG tablet TAKE 1 TABLET BY MOUTH  DAILY 90 tablet 3  . carvedilol (COREG) 3.125 MG tablet Take 3.125 mg by mouth 2 (two) times daily.    . diphenhydrAMINE (BENADRYL) 25 mg capsule Take by mouth. As needed    . dolutegravir (TIVICAY) 50 MG tablet Take 50 mg by mouth daily.    . eMarland Kitchentricitabine-tenofovir AF (DESCOVY) 200-25 MG tablet Take 1 tablet by mouth daily.    . EMarland KitchenTRESTO 49-51 MG Take 1 tablet by mouth 2 (two) times daily.    . fluconazole (DIFLUCAN) 150 MG tablet Take 150 mg by mouth once a week. prn    . Melatonin 5 MG TABS Take 5 mg by mouth at bedtime.     . Multiple Vitamin (MULTI-VITAMIN) tablet Take by mouth.    . pantoprazole (PROTONIX) 40 MG tablet Take 1 tablet by mouth daily.    . sMarland Kitchenironolactone (ALDACTONE) 25 MG tablet Take 25  mg by mouth daily.    Marland Kitchen torsemide (DEMADEX) 20 MG tablet 1-2 tabs daily as needed    . traZODone (DESYREL) 50 MG tablet TAKE 1-2 TABLETS (50-100 MG TOTAL) BY MOUTH AT BEDTIME. 180 tablet 3  . valACYclovir (VALTREX) 500 MG tablet Take 1 tablet (500 mg total) by mouth daily as needed (for fever blisters.). 30 tablet 5  . bictegravir-emtricitabine-tenofovir AF (BIKTARVY) 50-200-25 MG TABS tablet Take 1 tablet by mouth daily. (Patient not taking: Reported on 12/11/2019) 30 tablet 11  . nystatin (MYCOSTATIN) 100000 UNIT/ML suspension Take 5 mLs by mouth 4 (four) times daily.    Marland Kitchen senna-docusate (SENOKOT-S) 8.6-50 MG tablet Take by mouth.     No facility-administered medications prior to visit.     Per HPI unless specifically indicated in ROS section  below Review of Systems Objective:  BP 112/64 (BP Location: Left Arm, Patient Position: Sitting, Cuff Size: Normal)   Pulse 94   Temp 98 F (36.7 C) (Temporal)   Ht _0  (1.778 m)   Wt 186 lb 9 oz (84.6 kg)   SpO2 97%   BMI 26.77 kg/m   Wt Readings from Last 3 Encounters:  12/11/19 186 lb 9 oz (84.6 kg)  12/05/19 186 lb 2 oz (84.4 kg)  05/10/19 187 lb 8 oz (85 kg)      Physical Exam Vitals and nursing note reviewed.  Constitutional:      Appearance: Normal appearance. He is not ill-appearing.  Musculoskeletal:        General: Swelling and tenderness present. No signs of injury. Normal range of motion.     Right lower leg: No edema.     Left lower leg: Edema present.     Comments:  ROM bilateral ankles preserved, discomfort with inversion No pain at malleoli or heel, no pain with calcaneal squeeze 2+ DP bilaterally Sensation intact Significant tenderness to palpation at swollen lump anterior left ankle No palpable cords  Skin:    General: Skin is warm and dry.     Capillary Refill: Capillary refill takes less than 2 seconds.     Findings: Erythema present. No rash.     Comments: Anterior ankle with mild erythema/warmth  Neurological:     Mental Status: He is alert.  Psychiatric:        Mood and Affect: Mood normal.        Behavior: Behavior normal.       Results for orders placed or performed in visit on 12/05/19  Comp Met (CMET)  Result Value Ref Range   Glucose, Bld 113 (H) 65 - 99 mg/dL   BUN 28 (H) 7 - 25 mg/dL   Creat 1.93 (H) 0.70 - 1.25 mg/dL   BUN/Creatinine Ratio 15 6 - 22 (calc)   Sodium 134 (L) 135 - 146 mmol/L   Potassium 4.7 3.5 - 5.3 mmol/L   Chloride 97 (L) 98 - 110 mmol/L   CO2 24 20 - 32 mmol/L   Calcium 9.7 8.6 - 10.3 mg/dL   Total Protein 7.6 6.1 - 8.1 g/dL   Albumin 4.4 3.6 - 5.1 g/dL   Globulin 3.2 1.9 - 3.7 g/dL (calc)   AG Ratio 1.4 1.0 - 2.5 (calc)   Total Bilirubin 0.6 0.2 - 1.2 mg/dL   Alkaline phosphatase (APISO) 133 35 - 144  U/L   AST 25 10 - 35 U/L   ALT 25 9 - 46 U/L  Lipid panel  Result Value Ref Range   Cholesterol 136 <200  mg/dL   HDL 42 > OR = 40 mg/dL   Triglycerides 109 <150 mg/dL   LDL Cholesterol (Calc) 75 mg/dL (calc)   Total CHOL/HDL Ratio 3.2 <5.0 (calc)   Non-HDL Cholesterol (Calc) 94 <130 mg/dL (calc)  Hepatitis C Antibody  Result Value Ref Range   Hepatitis C Ab NON-REACTIVE NON-REACTI   SIGNAL TO CUT-OFF 0.02 <1.00  Protein / creatinine ratio, urine  Result Value Ref Range   Creatinine, Urine 37 20 - 320 mg/dL   Protein/Creat Ratio NOTE 22 - 128 mg/g creat   Protein/Creatinine Ratio NOTE 0.022 - 0.12 mg/mg creat   Total Protein, Urine <4 (L) 5 - 25 mg/dL   Assessment & Plan:  This visit occurred during the SARS-CoV-2 public health emergency.  Safety protocols were in place, including screening questions prior to the visit, additional usage of staff PPE, and extensive cleaning of exam room while observing appropriate contact time as indicated for disinfecting solutions.   Problem List Items Addressed This Visit    Acute left ankle pain - Primary    Acute left anterior ankle pain associated with warmth, swelling, after recently increased hamburger intake. Agree suspicious for gout. Check CBC, urate levels. Update Cr after recent bump. Rx prednisone taper after reviewing steroid precautions. Red flags to update [place] immediately or seek urgent care reviewed. Pt agrees with plan.  Not consistent with DVT.  Preserved ROM at ankle points against septic joint.       Relevant Orders   CBC with Differential/Platelet   Basic metabolic panel   Uric acid       Meds ordered this encounter  Medications  . predniSONE (DELTASONE) 20 MG tablet    Sig: Take two tablets daily for 4 days followed by one tablet daily for 4 days    Dispense:  12 tablet    Refill:  0   Orders Placed This Encounter  Procedures  . CBC with Differential/Platelet  . Basic metabolic panel  . Uric acid    Patient  instructions: I am suspicious for gout.  Treat with prednisone taper sent to pharmacy.  Keep leg elevated, avoid walking until feeling better.  Labs today.  Let us know if fever, or worsening spreading redness or warmth of the ankle.   Follow up plan: Return if symptoms worsen or fail to improve.  Ria Bush, MD

## 2019-12-11 NOTE — Patient Instructions (Addendum)
I am suspicious for gout.  Treat with prednisone taper sent to pharmacy.  Keep leg elevated, avoid walking until feeling better.  Labs today.  Let us know if fever, or worsening spreading redness or warmth of the ankle.   Gout  Gout is a condition that causes painful swelling of the joints. Gout is a type of inflammation of the joints (arthritis). This condition is caused by having too much uric acid in the body. Uric acid is a chemical that forms when the body breaks down substances called purines. Purines are important for building body proteins. When the body has too much uric acid, sharp crystals can form and build up inside the joints. This causes pain and swelling. Gout attacks can happen quickly and may be very painful (acute gout). Over time, the attacks can affect more joints and become more frequent (chronic gout). Gout can also cause uric acid to build up under the skin and inside the kidneys. What are the causes? This condition is caused by too much uric acid in your blood. This can happen because:  Your kidneys do not remove enough uric acid from your blood. This is the most common cause.  Your body makes too much uric acid. This can happen with some cancers and cancer treatments. It can also occur if your body is breaking down too many red blood cells (hemolytic anemia).  You eat too many foods that are high in purines. These foods include organ meats and some seafood. Alcohol, especially beer, is also high in purines. A gout attack may be triggered by trauma or stress. What increases the risk? You are more likely to develop this condition if you:  Have a family history of gout.  Are male and middle-aged.  Are male and have gone through menopause.  Are obese.  Frequently drink alcohol, especially beer.  Are dehydrated.  Lose weight too quickly.  Have an organ transplant.  Have lead poisoning.  Take certain medicines, including aspirin, cyclosporine, diuretics,  levodopa, and niacin.  Have kidney disease.  Have a skin condition called psoriasis. What are the signs or symptoms? An attack of acute gout happens quickly. It usually occurs in just one joint. The most common place is the big toe. Attacks often start at night. Other joints that may be affected include joints of the feet, ankle, knee, fingers, wrist, or elbow. Symptoms of this condition may include:  Severe pain.  Warmth.  Swelling.  Stiffness.  Tenderness. The affected joint may be very painful to touch.  Shiny, red, or purple skin.  Chills and fever. Chronic gout may cause symptoms more frequently. More joints may be involved. You may also have white or yellow lumps (tophi) on your hands or feet or in other areas near your joints. How is this diagnosed? This condition is diagnosed based on your symptoms, medical history, and physical exam. You may have tests, such as:  Blood tests to measure uric acid levels.  Removal of joint fluid with a thin needle (aspiration) to look for uric acid crystals.  X-rays to look for joint damage. How is this treated? Treatment for this condition has two phases: treating an acute attack and preventing future attacks. Acute gout treatment may include medicines to reduce pain and swelling, including:  NSAIDs.  Steroids. These are strong anti-inflammatory medicines that can be taken by mouth (orally) or injected into a joint.  Colchicine. This medicine relieves pain and swelling when it is taken soon after an attack. It can  be given by mouth or through an IV. Preventive treatment may include:  Daily use of smaller doses of NSAIDs or colchicine.  Use of a medicine that reduces uric acid levels in your blood.  Changes to your diet. You may need to see a dietitian about what to eat and drink to prevent gout. Follow these instructions at home: During a gout attack   If directed, put ice on the affected area: ? Put ice in a plastic  bag. ? Place a towel between your skin and the bag. ? Leave the ice on for 20 minutes, 2-3 times a day.  Raise (elevate) the affected joint above the level of your heart as often as possible.  Rest the joint as much as possible. If the affected joint is in your leg, you may be given crutches to use.  Follow instructions from your health care provider about eating or drinking restrictions. Avoiding future gout attacks  Follow a low-purine diet as told by your dietitian or health care provider. Avoid foods and drinks that are high in purines, including liver, kidney, anchovies, asparagus, herring, mushrooms, mussels, and beer.  Maintain a healthy weight or lose weight if you are overweight. If you want to lose weight, talk with your health care provider. It is important that you do not lose weight too quickly.  Start or maintain an exercise program as told by your health care provider. Eating and drinking  Drink enough fluids to keep your urine pale yellow.  If you drink alcohol: ? Limit how much you use to:  0-1 drink a day for women.  0-2 drinks a day for men. ? Be aware of how much alcohol is in your drink. In the [place] one drink equals one 12 oz bottle of beer (355 mL) one 5 oz glass of wine (148 mL), or one 1 oz glass of hard liquor (44 mL). General instructions  Take over-the-counter and prescription medicines only as told by your health care provider.  Do not drive or use heavy machinery while taking prescription pain medicine.  Return to your normal activities as told by your health care provider. Ask your health care provider what activities are safe for you.  Keep all follow-up visits as told by your health care provider. This is important. Contact a health care provider if you have:  Another gout attack.  Continuing symptoms of a gout attack after 10 days of treatment.  Side effects from your medicines.  Chills or a fever.  Burning pain when you  urinate.  Pain in your lower back or belly. Get help right away if you:  Have severe or uncontrolled pain.  Cannot urinate. Summary  Gout is painful swelling of the joints caused by inflammation.  The most common site of pain is the big toe, but it can affect other joints in the body.  Medicines and dietary changes can help to prevent and treat gout attacks. This information is not intended to replace advice given to you by your health care provider. Make sure you discuss any questions you have with your health care provider. Document Revised: 12/01/2017 Document Reviewed: 12/01/2017 Elsevier Patient Education  [facility].

## 2019-12-12 DIAGNOSIS — M25572 Pain in left ankle and joints of left foot: Secondary | ICD-10-CM | POA: Insufficient documentation

## 2019-12-12 LAB — CBC WITH DIFFERENTIAL/PLATELET
Basophils Absolute: 0.1 10*3/uL (ref 0.0–0.1)
Basophils Relative: 0.9 % (ref 0.0–3.0)
Eosinophils Absolute: 0.2 10*3/uL (ref 0.0–0.7)
Eosinophils Relative: 3 % (ref 0.0–5.0)
HCT: 37.6 % — ABNORMAL LOW (ref 39.0–52.0)
Hemoglobin: 13 g/dL (ref 13.0–17.0)
Lymphocytes Relative: 27.3 % (ref 12.0–46.0)
Lymphs Abs: 1.9 10*3/uL (ref 0.7–4.0)
MCHC: 34.7 g/dL (ref 30.0–36.0)
MCV: 96.6 fl (ref 78.0–100.0)
Monocytes Absolute: 0.6 10*3/uL (ref 0.1–1.0)
Monocytes Relative: 8.4 % (ref 3.0–12.0)
Neutro Abs: 4.2 10*3/uL (ref 1.4–7.7)
Neutrophils Relative %: 60.4 % (ref 43.0–77.0)
Platelets: 231 10*3/uL (ref 150.0–400.0)
RBC: 3.89 Mil/uL — ABNORMAL LOW (ref 4.22–5.81)
RDW: 13.1 % (ref 11.5–15.5)
WBC: 7 10*3/uL (ref 4.0–10.5)

## 2019-12-12 LAB — BASIC METABOLIC PANEL
BUN: 24 mg/dL — ABNORMAL HIGH (ref 6–23)
CO2: 27 mEq/L (ref 19–32)
Calcium: 9.8 mg/dL (ref 8.4–10.5)
Chloride: 96 mEq/L (ref 96–112)
Creatinine, Ser: 1.73 mg/dL — ABNORMAL HIGH (ref 0.40–1.50)
GFR: 39.43 mL/min — ABNORMAL LOW (ref >60.00)
Glucose, Bld: 118 mg/dL — ABNORMAL HIGH (ref 70–99)
Potassium: 4.4 mEq/L (ref 3.5–5.1)
Sodium: 131 mEq/L — ABNORMAL LOW (ref 135–145)

## 2019-12-12 LAB — URIC ACID: Uric Acid, Serum: 8.9 mg/dL — ABNORMAL HIGH (ref 4.0–7.8)

## 2019-12-12 NOTE — Assessment & Plan Note (Addendum)
Acute left anterior ankle pain associated with warmth, swelling, after recently increased hamburger intake. Agree suspicious for gout. Check CBC, urate levels. Update Cr after recent bump. Rx prednisone taper after reviewing steroid precautions. Red flags to update [place] immediately or seek urgent care reviewed. Pt agrees with plan.  Not consistent with DVT.  Preserved ROM at ankle points against septic joint.

## 2019-12-14 DIAGNOSIS — Z20822 Contact with and (suspected) exposure to covid-19: Secondary | ICD-10-CM | POA: Diagnosis not present

## 2019-12-22 ENCOUNTER — Encounter: Payer: Self-pay | Admitting: Infectious Disease

## 2019-12-22 ENCOUNTER — Other Ambulatory Visit: Payer: Self-pay | Admitting: Internal Medicine

## 2019-12-22 LAB — CD4/CD8 (T-HELPER/T-SUPPRESSOR CELL): HIV 1 RNA UltraQuant: <40

## 2019-12-22 MED ORDER — COLCHICINE 0.6 MG PO TABS
0.6000 mg | ORAL_TABLET | Freq: Two times a day (BID) | ORAL | 1 refills | Status: DC | PRN
Start: 2019-12-22 — End: 2020-01-08

## 2019-12-27 DIAGNOSIS — Z20822 Contact with and (suspected) exposure to covid-19: Secondary | ICD-10-CM | POA: Diagnosis not present

## 2020-01-08 ENCOUNTER — Encounter: Payer: Medicare Other | Admitting: Internal Medicine

## 2020-01-08 ENCOUNTER — Encounter: Payer: Self-pay | Admitting: Internal Medicine

## 2020-01-08 ENCOUNTER — Other Ambulatory Visit: Payer: Self-pay

## 2020-01-08 ENCOUNTER — Ambulatory Visit (INDEPENDENT_AMBULATORY_CARE_PROVIDER_SITE_OTHER): Payer: Medicare Other | Admitting: Internal Medicine

## 2020-01-08 VITALS — BP 104/52 | HR 93 | Temp 96.3°F | Ht 69.75 in | Wt 183.5 lb

## 2020-01-08 DIAGNOSIS — M1000 Idiopathic gout, unspecified site: Secondary | ICD-10-CM

## 2020-01-08 DIAGNOSIS — I480 Paroxysmal atrial fibrillation: Secondary | ICD-10-CM

## 2020-01-08 DIAGNOSIS — F39 Unspecified mood [affective] disorder: Secondary | ICD-10-CM

## 2020-01-08 DIAGNOSIS — Z0000 Encounter for general adult medical examination without abnormal findings: Secondary | ICD-10-CM

## 2020-01-08 DIAGNOSIS — I5022 Chronic systolic (congestive) heart failure: Secondary | ICD-10-CM | POA: Diagnosis not present

## 2020-01-08 DIAGNOSIS — Z7189 Other specified counseling: Secondary | ICD-10-CM

## 2020-01-08 DIAGNOSIS — M109 Gout, unspecified: Secondary | ICD-10-CM | POA: Insufficient documentation

## 2020-01-08 DIAGNOSIS — N1832 Chronic kidney disease, stage 3b: Secondary | ICD-10-CM

## 2020-01-08 MED ORDER — COLCHICINE 0.6 MG PO TABS: 0.6000 mg | ORAL_TABLET | Freq: Two times a day (BID) | ORAL | 3 refills | Status: DC | PRN

## 2020-01-08 NOTE — Assessment & Plan Note (Signed)
Compensated Monitors weight daily on diuretics

## 2020-01-08 NOTE — Assessment & Plan Note (Signed)
Chronic anxiety Has trazodone for sleep---otherwise okay without Rx

## 2020-01-08 NOTE — Assessment & Plan Note (Signed)
Still regular on carvedilol ASA only

## 2020-01-08 NOTE — Assessment & Plan Note (Signed)
Is on ARB (entresto)

## 2020-01-08 NOTE — Assessment & Plan Note (Signed)
Using the colchicine as a preventative at this point Daily only

## 2020-01-08 NOTE — Assessment & Plan Note (Signed)
I have personally reviewed the Medicare Annual Wellness questionnaire and have noted 1. The patient's medical and social history 2. Their use of alcohol, tobacco or illicit drugs 3. Their current medications and supplements 4. The patient's functional ability including ADL's, fall risks, home safety risks and hearing or visual             impairment. 5. Diet and physical activities 6. Evidence for depression or mood disorders  The patients weight, height, BMI and visual acuity have been recorded in the chart I have made referrals, counseling and provided education to the patient based review of the above and I have provided the pt with a written personalized care plan for preventive services.  I have provided you with a copy of your personalized plan for preventive services. Please take the time to review along with your updated medication list.  Colon due 2024 Flu vaccine later in the fall Will defer PSA again Regularly exercising

## 2020-01-08 NOTE — Assessment & Plan Note (Signed)
See social history 

## 2020-01-08 NOTE — Progress Notes (Signed)
Subjective:    Patient ID: Dale Mcdonald, male    DOB: 07-24-1950, 69 y.o.   MRN: [id number]  HPI Here for Medicare wellness visit and follow up of chronic health conditions This visit occurred during the SARS-CoV-2 public health emergency.  Safety protocols were in place, including screening questions prior to the visit, additional usage of staff PPE, and extensive cleaning of exam room while observing appropriate contact time as indicated for disinfecting solutions.   Reviewed form and advanced directives Reviewed other doctors Rare beer Still vapes ---but no tobacco Has been exercising regularly Vision is fine Hearing is good No falls Independent with instrumental ADLs Has some aggravating memory issues---but nothing worrisome  Lost tooth in permanent bridge--new one made  Having trouble with the gout Uses the colchicine bid and it relieves exacerbations Quickly comes back but controlled now with daily  Had pulmonary stress test---did okay Still scheduled to see the CHF specialists Endurance has diminished some in the past year [facility]went [facility]eling, etc Weighs daily No recent diuretic adjustments No chest pain DOE--without striking change Slight left ankle edema--relates to gout Sleeps in bed--HOB elevated. No PND  Still gets anxiety---heightened at times Nothing new No depression or anhedonia Uses the trazodone for sleep---occasionally 75mg   Finishing out his descovy and will switch to biktarvy Doing well still  GFR just done---39  Current Outpatient Medications on File Prior to Visit  Medication Sig Dispense Refill  . aspirin EC 81 MG tablet Take 81 mg by mouth daily.    Marland Kitchen atorvastatin (LIPITOR) 20 MG tablet TAKE 1 TABLET BY MOUTH  DAILY 90 tablet 3  . carvedilol (COREG) 3.125 MG tablet Take 3.125 mg by mouth 2 (two) times daily.    . colchicine 0.6 MG tablet Take 1 tablet (0.6 mg total) by mouth 2 (two) times daily as needed. 60 tablet 1  .  dolutegravir (TIVICAY) 50 MG tablet Take 50 mg by mouth daily.    Marland Kitchen emtricitabine-tenofovir AF (DESCOVY) 200-25 MG tablet Take 1 tablet by mouth daily.    Marland Kitchen ENTRESTO 49-51 MG Take 1 tablet by mouth 2 (two) times daily.    . fluconazole (DIFLUCAN) 150 MG tablet Take 150 mg by mouth once a week. prn    . Melatonin 5 MG TABS Take 5 mg by mouth at bedtime.     . Multiple Vitamin (MULTI-VITAMIN) tablet Take by mouth.    . pantoprazole (PROTONIX) 40 MG tablet Take 1 tablet by mouth daily.    Marland Kitchen spironolactone (ALDACTONE) 25 MG tablet Take 25 mg by mouth daily.    Marland Kitchen torsemide (DEMADEX) 20 MG tablet 1-2 tabs daily as needed    . traZODone (DESYREL) 50 MG tablet TAKE 1-2 TABLETS (50-100 MG TOTAL) BY MOUTH AT BEDTIME. 180 tablet 3  . valACYclovir (VALTREX) 500 MG tablet Take 1 tablet (500 mg total) by mouth daily as needed (for fever blisters.). 30 tablet 5  . bictegravir-emtricitabine-tenofovir AF (BIKTARVY) 50-200-25 MG TABS tablet Take 1 tablet by mouth daily. (Patient not taking: Reported on 12/11/2019) 30 tablet 11   No current facility-administered medications on file prior to visit.    Allergies  Allergen Reactions  . Magnesium Nausea Only    Past Medical History:  Diagnosis Date  . Anxiety   . Anxiety 03/29/2017  . Heart murmur   . Hepatitis    B  . HIV (human immunodeficiency virus infection) ([place])   . Hyperlipidemia   . Hypertension   . Palpitations  Past Surgical History:  Procedure Laterality Date  . CATARACT EXTRACTION W/PHACO Left 11/11/2017   Procedure: CATARACT EXTRACTION PHACO AND INTRAOCULAR LENS PLACEMENT (IOC);  Surgeon: Birder Robson, MD;  Location: [facility] ORS;  Service: Ophthalmology;  Laterality: Left;  [place] 00:59.3 AP% 19.5 CDE 11.54 Fluid pack lot # [id number]  . CATARACT EXTRACTION W/PHACO Right 12/21/2017   Procedure: CATARACT EXTRACTION PHACO AND INTRAOCULAR LENS PLACEMENT (IOC);  Surgeon: Birder Robson, MD;  Location: [facility] ORS;  Service: Ophthalmology;   Laterality: Right;  [place] 00:40 AP% 13.5 CDE 5.50 Fluid pack lot $ [id number]  . COLONOSCOPY    . COLONOSCOPY WITH PROPOFOL N/A 02/08/2018   Procedure: COLONOSCOPY WITH PROPOFOL;  Surgeon: Jonathon Bellows, MD;  Location: [facility] ENDOSCOPY;  Service: Gastroenterology;  Laterality: N/A;  . CORONARY ARTERY BYPASS GRAFT  04/07/2018  . EYE SURGERY     RETINA  . HERNIA REPAIR    . MITRAL VALVE REPLACEMENT  03/2018   bovine--with CABG    History reviewed. No pertinent family history.  Social History   Socioeconomic History  . Marital status: Widowed    Spouse name: Not on file  . Number of children: 1  . Years of education: Not on file  . Highest education level: Not on file  Occupational History  . 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 Smoking status: Former Smoker    Packs/day: 2.00    Years: 40.00    Pack years: 80.00    Types: Cigarettes, E-cigarettes  . Smokeless tobacco: Never Used  . Tobacco comment: Quit 05/25/2017/ cutting back  Substance and Sexual Activity  . Alcohol use: No    Alcohol/week: 0.0 standard drinks  . Drug use: No  . Sexual activity: Not on file  Other Topics Concern  . Not on file  Social History Narrative   No living will   Would want son [name] to be decision maker   Would accept resuscitation but no prolonged ventilation   Not sure about tube feeds--but definitely not long term   Social Determinants of Health   Financial Resource Strain:   . Difficulty of Paying Living Expenses:   Food Insecurity:   . Worried About Charity fundraiser in the Last Year:   . Arboriculturist in the Last Year:   Transportation Needs:   . Film/video editor (Medical):   Marland Kitchen Lack of Transportation (Non-Medical):   Physical Activity:   . Days of Exercise per Week:   . Minutes of Exercise per Session:   Stress:   . Feeling of Stress :   Social Connections:   . Frequency of Communication with Friends and  Family:   . Frequency of Social Gatherings with Friends and Family:   . Attends Religious Services:   . Active Member of Clubs or Organizations:   . Attends Archivist Meetings:   Marland Kitchen Marital Status:   Intimate Partner Violence:   . Fear of Current or Ex-Partner:   . Emotionally Abused:   Marland Kitchen Physically Abused:   . Sexually Abused:    Review of Systems Appetite is fine Weight is stable Wears seat belt Has tiny bumps on back---derm felt it was just dry skin (lotion has helped) Some recent heartburn--none for a long time before this week. tums helps. Sill on protonix. No dysphagia Voids okay. Nocturia only occasionally Bowels are fine No intolerant to chocolate---gives diarrhea No sig back or joint pains--unless left ankle with the gout  Objective:   Physical Exam Constitutional:      Appearance: Normal appearance.  HENT:     Mouth/Throat:     Comments: No lesions Eyes:     Conjunctiva/sclera: Conjunctivae normal.     Pupils: Pupils are equal, round, and reactive to light.  Cardiovascular:     Rate and Rhythm: Normal rate and regular rhythm.     Pulses: Normal pulses.     Heart sounds: Normal heart sounds. No murmur heard.  No gallop.   Abdominal:     Palpations: Abdomen is soft.     Tenderness: There is no abdominal tenderness.  Musculoskeletal:     Cervical back: Neck supple.     Right lower leg: No edema.     Left lower leg: No edema.     Comments: Puffiness at top of left ankle  Lymphadenopathy:     Cervical: No cervical adenopathy.  Skin:    Findings: No rash.  Neurological:     Mental Status: He is alert and oriented to person, place, and time.     Comments: President--- "Waymond Cera, Obama" [phone] [id number] Recall 3/3  Psychiatric:        Mood and Affect: Mood normal.        Behavior: Behavior normal.            Assessment & Plan:

## 2020-01-13 ENCOUNTER — Other Ambulatory Visit: Payer: Self-pay | Admitting: Internal Medicine

## 2020-01-18 DIAGNOSIS — I5022 Chronic systolic (congestive) heart failure: Secondary | ICD-10-CM | POA: Diagnosis not present

## 2020-02-03 DIAGNOSIS — Z03818 Encounter for observation for suspected exposure to other biological agents ruled out: Secondary | ICD-10-CM | POA: Diagnosis not present

## 2020-02-03 DIAGNOSIS — Z20822 Contact with and (suspected) exposure to covid-19: Secondary | ICD-10-CM | POA: Diagnosis not present

## 2020-02-14 ENCOUNTER — Encounter: Payer: Self-pay | Admitting: Infectious Disease

## 2020-02-14 LAB — CD4/CD8 (T-HELPER/T-SUPPRESSOR CELL)
CD4 % Helper T Cell: 36
CD4 Count: 576
CD8 % Suppressor T Cell: 39.3
CD8 T Cell Abs: 629

## 2020-02-27 ENCOUNTER — Other Ambulatory Visit: Payer: Self-pay

## 2020-02-27 MED ORDER — COLCHICINE 0.6 MG PO TABS: 0.6000 mg | ORAL_TABLET | Freq: Two times a day (BID) | ORAL | 3 refills | Status: DC | PRN

## 2020-02-27 NOTE — Telephone Encounter (Signed)
Rx sent electronically.  

## 2020-02-29 MED ORDER — ALLOPURINOL 300 MG PO TABS: 300.0000 mg | ORAL_TABLET | Freq: Every day | ORAL | 3 refills | Status: DC

## 2020-03-04 ENCOUNTER — Other Ambulatory Visit: Payer: Self-pay | Admitting: Infectious Disease

## 2020-03-04 DIAGNOSIS — M109 Gout, unspecified: Secondary | ICD-10-CM

## 2020-03-12 DIAGNOSIS — Z23 Encounter for immunization: Secondary | ICD-10-CM | POA: Diagnosis not present

## 2020-03-19 DIAGNOSIS — Z23 Encounter for immunization: Secondary | ICD-10-CM | POA: Diagnosis not present

## 2020-03-19 DIAGNOSIS — Z953 Presence of xenogenic heart valve: Secondary | ICD-10-CM | POA: Diagnosis not present

## 2020-03-27 ENCOUNTER — Other Ambulatory Visit: Payer: Self-pay | Admitting: Infectious Disease

## 2020-04-02 ENCOUNTER — Other Ambulatory Visit: Payer: Medicare Other

## 2020-04-02 ENCOUNTER — Other Ambulatory Visit: Payer: Self-pay

## 2020-04-02 ENCOUNTER — Other Ambulatory Visit (HOSPITAL_COMMUNITY)
Admission: RE | Admit: 2020-04-02 | Discharge: 2020-04-02 | Disposition: A | Discharge status: Home / Self Care | Payer: Medicare Other | Source: Ambulatory Visit | Attending: Infectious Disease | Admitting: Infectious Disease

## 2020-04-02 DIAGNOSIS — B181 Chronic viral hepatitis B without delta-agent: Secondary | ICD-10-CM | POA: Insufficient documentation

## 2020-04-02 DIAGNOSIS — M869 Osteomyelitis, unspecified: Secondary | ICD-10-CM | POA: Insufficient documentation

## 2020-04-02 DIAGNOSIS — B20 Human immunodeficiency virus [HIV] disease: Secondary | ICD-10-CM

## 2020-04-02 DIAGNOSIS — M109 Gout, unspecified: Secondary | ICD-10-CM

## 2020-04-03 LAB — CYTOLOGY, (ORAL, ANAL, URETHRAL) ANCILLARY ONLY
Chlamydia: NEGATIVE
Comment: NEGATIVE
Comment: NORMAL
Neisseria Gonorrhea: NEGATIVE

## 2020-04-03 LAB — T-HELPER CELL (CD4) - (RCID CLINIC ONLY)
CD4 % Helper T Cell: 36 % (ref 33–65)
CD4 T Cell Abs: 728 /uL (ref 400–1790)

## 2020-04-08 LAB — CBC WITH DIFFERENTIAL/PLATELET
Absolute Monocytes: 475 cells/uL (ref 200–950)
Basophils Absolute: 49 cells/uL (ref 0–200)
Basophils Relative: 1 %
Eosinophils Absolute: 113 cells/uL (ref 15–500)
Eosinophils Relative: 2.3 %
HCT: 42.2 % (ref 38.5–50.0)
Hemoglobin: 14.5 g/dL (ref 13.2–17.1)
Lymphs Abs: 1980 cells/uL (ref 850–3900)
MCH: 33 pg (ref 27.0–33.0)
MCHC: 34.4 g/dL (ref 32.0–36.0)
MCV: 95.9 fL (ref 80.0–100.0)
MPV: 10.5 fL (ref 7.5–12.5)
Monocytes Relative: 9.7 %
Neutro Abs: 2283 cells/uL (ref 1500–7800)
Neutrophils Relative %: 46.6 %
Platelets: 214 10*3/uL (ref 140–400)
RBC: 4.4 10*6/uL (ref 4.20–5.80)
RDW: 13.4 % (ref 11.0–15.0)
Total Lymphocyte: 40.4 %
WBC: 4.9 10*3/uL (ref 3.8–10.8)

## 2020-04-08 LAB — HIV-1 RNA QUANT-NO REFLEX-BLD
HIV 1 RNA Quant: 34 Copies/mL — ABNORMAL HIGH
HIV-1 RNA Quant, Log: 1.54 Log cps/mL — ABNORMAL HIGH

## 2020-04-08 LAB — RPR: RPR Ser Ql: NONREACTIVE

## 2020-04-08 LAB — COMPLETE METABOLIC PANEL WITH GFR
AG Ratio: 1.6 (calc) (ref 1.0–2.5)
ALT: 47 U/L — ABNORMAL HIGH (ref 9–46)
AST: 42 U/L — ABNORMAL HIGH (ref 10–35)
Albumin: 4.6 g/dL (ref 3.6–5.1)
Alkaline phosphatase (APISO): 116 U/L (ref 35–144)
BUN/Creatinine Ratio: 15 (calc) (ref 6–22)
BUN: 22 mg/dL (ref 7–25)
CO2: 27 mmol/L (ref 20–32)
Calcium: 10 mg/dL (ref 8.6–10.3)
Chloride: 98 mmol/L (ref 98–110)
Creat: 1.51 mg/dL — ABNORMAL HIGH (ref 0.70–1.25)
GFR, Est African American: 54 mL/min/{1.73_m2} — ABNORMAL LOW (ref >=60)
GFR, Est Non African American: 47 mL/min/{1.73_m2} — ABNORMAL LOW (ref >=60)
Globulin: 2.8 g/dL (calc) (ref 1.9–3.7)
Glucose, Bld: 164 mg/dL — ABNORMAL HIGH (ref 65–99)
Potassium: 4.6 mmol/L (ref 3.5–5.3)
Sodium: 134 mmol/L — ABNORMAL LOW (ref 135–146)
Total Bilirubin: 0.7 mg/dL (ref 0.2–1.2)
Total Protein: 7.4 g/dL (ref 6.1–8.1)

## 2020-04-08 LAB — URIC ACID: Uric Acid, Serum: 10.6 mg/dL — ABNORMAL HIGH (ref 4.0–8.0)

## 2020-04-09 NOTE — Telephone Encounter (Signed)
Patient with concerns about viral load results, switched to biktarvy 5 weeks ago from tivicay/descovy. He has an appointmetn with you 11/24. Landis Gandy, RN

## 2020-04-17 ENCOUNTER — Encounter: Payer: Self-pay | Admitting: Infectious Disease

## 2020-04-17 ENCOUNTER — Other Ambulatory Visit: Payer: Self-pay

## 2020-04-17 ENCOUNTER — Ambulatory Visit: Payer: Medicare Other | Admitting: Infectious Disease

## 2020-04-17 VITALS — BP 107/68 | HR 77 | Temp 97.6°F | Wt 191.0 lb

## 2020-04-17 DIAGNOSIS — I2510 Atherosclerotic heart disease of native coronary artery without angina pectoris: Secondary | ICD-10-CM | POA: Diagnosis not present

## 2020-04-17 DIAGNOSIS — R739 Hyperglycemia, unspecified: Secondary | ICD-10-CM | POA: Diagnosis not present

## 2020-04-17 DIAGNOSIS — I5022 Chronic systolic (congestive) heart failure: Secondary | ICD-10-CM | POA: Diagnosis not present

## 2020-04-17 DIAGNOSIS — I10 Essential (primary) hypertension: Secondary | ICD-10-CM | POA: Diagnosis not present

## 2020-04-17 DIAGNOSIS — B20 Human immunodeficiency virus [HIV] disease: Secondary | ICD-10-CM | POA: Diagnosis not present

## 2020-04-17 DIAGNOSIS — Z951 Presence of aortocoronary bypass graft: Secondary | ICD-10-CM

## 2020-04-17 DIAGNOSIS — M1000 Idiopathic gout, unspecified site: Secondary | ICD-10-CM

## 2020-04-17 DIAGNOSIS — B181 Chronic viral hepatitis B without delta-agent: Secondary | ICD-10-CM

## 2020-04-17 DIAGNOSIS — I517 Cardiomegaly: Secondary | ICD-10-CM

## 2020-04-17 DIAGNOSIS — I2583 Coronary atherosclerosis due to lipid rich plaque: Secondary | ICD-10-CM

## 2020-04-17 DIAGNOSIS — N1832 Chronic kidney disease, stage 3b: Secondary | ICD-10-CM

## 2020-04-17 HISTORY — DX: Hyperglycemia, unspecified: R73.9

## 2020-04-17 MED ORDER — BIKTARVY 50-200-25 MG PO TABS
1.0000 | ORAL_TABLET | Freq: Every day | ORAL | 11 refills | Status: DC
Start: 2020-04-17 — End: 2021-04-07

## 2020-04-17 NOTE — Progress Notes (Signed)
Subjective:   Chief complaint: followup for HIV he is, he had been concerned by his viral load going to 34 which I think is a blip also about his blood sugar that was elevated  Patient ID: Dale Mcdonald, male    DOB: April 03, 1951, 69 y.o.   MRN: [id number]  HPI  69 year old male who is doing superbly well on his antiviral regimen of Atripla to which he had been changed from his prior regimen of Reyataz Norvir Truvada which he had received through AIDS clinical trials group 5257 to Atripla.  His been enrolled in HALO study in the [facility].  We tried to change him to [place] but his insurance would not cover this.    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  Infection is subsequent resolved  Did sustain a acute kidney injury and has had some chronic kidney disease since then.  Switch him over to [facility] which he has been on.  He is recently had apparently a flare of gout and had his uric acid level checked which was elevated.  Blood sugar was also elevated.  He was in the prediabetic range in terms of his A1c which we will recheck today.    Past Medical History:  Diagnosis Date  . Anxiety   . Anxiety 03/29/2017  . Heart murmur   . Hepatitis    B  . HIV (human immunodeficiency virus infection) ([place])   . Hyperglycemia 04/17/2020  . Hyperlipidemia   . Hypertension   . Palpitations     Past Surgical History:  Procedure Laterality Date  . CATARACT EXTRACTION W/PHACO Left 11/11/2017   Procedure: CATARACT EXTRACTION PHACO AND INTRAOCULAR LENS PLACEMENT (IOC);  Surgeon: Birder Robson, MD;  Location: [facility] ORS;  Service: Ophthalmology;  Laterality: Left;  [place] 00:59.3 AP% 19.5 CDE 11.54 Fluid pack lot # [id number]  . CATARACT EXTRACTION  W/PHACO Right 12/21/2017   Procedure: CATARACT EXTRACTION PHACO AND INTRAOCULAR LENS PLACEMENT (IOC);  Surgeon: Birder Robson, MD;  Location: [facility] ORS;  Service: Ophthalmology;  Laterality: Right;  [place] 00:40 AP% 13.5 CDE 5.50 Fluid pack lot $ [id number]  . COLONOSCOPY    . COLONOSCOPY WITH PROPOFOL N/A 02/08/2018   Procedure: COLONOSCOPY WITH PROPOFOL;  Surgeon: Jonathon Bellows, MD;  Location: [facility] ENDOSCOPY;  Service: Gastroenterology;  Laterality: N/A;  . CORONARY ARTERY BYPASS GRAFT  04/07/2018  . EYE SURGERY     RETINA  . HERNIA REPAIR    . MITRAL VALVE REPLACEMENT  03/2018   bovine--with CABG    No family history on file.    Social History   Socioeconomic History  . Marital status: Widowed    Spouse name: Not on file  . Number of children: 1  . Years of education: Not on file  . Highest education level: Not on file  Occupational History  . 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 Smoking status: Former Smoker    Packs/day: 2.00    Years: 40.00    Pack years: 80.00    Types: Cigarettes, E-cigarettes  . Smokeless tobacco: Never Used  . Tobacco comment: Quit 05/25/2017/ cutting back  Substance and Sexual Activity  . Alcohol use: No    Alcohol/week: 0.0 standard drinks  . Drug use: No  . Sexual  activity: Not on file  Other Topics Concern  . Not on file  Social History Narrative   No living will   Would want son [name] to be decision maker   Would accept resuscitation but no prolonged ventilation   Not sure about tube feeds--but definitely not long term   Social Determinants of Health   Financial Resource Strain:   . Difficulty of Paying Living Expenses: Not on file  Food Insecurity:   . Worried About Charity fundraiser in the Last Year: Not on file  . Ran Out of Food in the Last Year: Not on file  Transportation Needs:   . Lack of Transportation (Medical): Not on file  . Lack of Transportation  (Non-Medical): Not on file  Physical Activity:   . Days of Exercise per Week: Not on file  . Minutes of Exercise per Session: Not on file  Stress:   . Feeling of Stress : Not on file  Social Connections:   . Frequency of Communication with Friends and Family: Not on file  . Frequency of Social Gatherings with Friends and Family: Not on file  . Attends Religious Services: Not on file  . Active Member of Clubs or Organizations: Not on file  . Attends Archivist Meetings: Not on file  . Marital Status: Not on file    Allergies  Allergen Reactions  . Magnesium Nausea Only     Current Outpatient Medications:  .  allopurinol (ZYLOPRIM) 300 MG tablet, Take 1 tablet (300 mg total) by mouth daily., Disp: 90 tablet, Rfl: 3 .  aspirin EC 81 MG tablet, Take 81 mg by mouth daily., Disp: , Rfl:  .  atorvastatin (LIPITOR) 20 MG tablet, TAKE 1 TABLET BY MOUTH  DAILY, Disp: 90 tablet, Rfl: 3 .  bictegravir-emtricitabine-tenofovir AF (BIKTARVY) 50-200-25 MG TABS tablet, Take 1 tablet by mouth daily., Disp: 30 tablet, Rfl: 11 .  carvedilol (COREG) 3.125 MG tablet, Take 3.125 mg by mouth 2 (two) times daily., Disp: , Rfl:  .  colchicine 0.6 MG tablet, Take 1 tablet (0.6 mg total) by mouth 2 (two) times daily as needed., Disp: 180 tablet, Rfl: 3 .  ENTRESTO 49-51 MG, Take 1 tablet by mouth 2 (two) times daily., Disp: , Rfl:  .  fluconazole (DIFLUCAN) 150 MG tablet, Take 150 mg by mouth once a week. prn, Disp: , Rfl:  .  Melatonin 5 MG TABS, Take 5 mg by mouth at bedtime. , Disp: , Rfl:  .  Multiple Vitamin (MULTI-VITAMIN) tablet, Take by mouth., Disp: , Rfl:  .  pantoprazole (PROTONIX) 40 MG tablet, Take 1 tablet by mouth daily., Disp: , Rfl:  .  spironolactone (ALDACTONE) 25 MG tablet, Take 25 mg by mouth daily., Disp: , Rfl:  .  torsemide (DEMADEX) 20 MG tablet, 1-2 tabs daily as needed, Disp: , Rfl:  .  traZODone (DESYREL) 50 MG tablet, TAKE 1-2 TABLETS (50-100 MG TOTAL) BY MOUTH AT  BEDTIME., Disp: 180 tablet, Rfl: 3 .  valACYclovir (VALTREX) 500 MG tablet, Take 1 tablet (500 mg total) by mouth daily as needed (for fever blisters.)., Disp: 30 tablet, Rfl: 5     Review of Systems  Constitutional: Negative for activity change, appetite change, chills, diaphoresis, fatigue and fever.  HENT: Negative for congestion, rhinorrhea, sinus pressure, sneezing, sore throat and trouble swallowing.   Eyes: Negative for photophobia and visual disturbance.  Respiratory: Negative for cough, chest tightness, shortness of breath, wheezing and stridor.   Cardiovascular: Negative  for chest pain, palpitations and leg swelling.  Gastrointestinal: Negative for abdominal distention, abdominal pain, anal bleeding, blood in stool, diarrhea, nausea and vomiting.  Genitourinary: Negative for difficulty urinating, dysuria, flank pain and hematuria.  Musculoskeletal: Negative for arthralgias, back pain, gait problem, joint swelling and myalgias.  Skin: Negative for color change, pallor, rash and wound.  Neurological: Negative for dizziness, tremors, weakness and light-headedness.  Hematological: Negative for adenopathy. Does not bruise/bleed easily.  Psychiatric/Behavioral: Negative for agitation, behavioral problems, confusion, decreased concentration, dysphoric mood and sleep disturbance.       Objective:   Physical Exam Constitutional:      General: He is not in acute distress.    Appearance: He is well-developed. He is not diaphoretic.  HENT:     Head: Normocephalic and atraumatic.     Mouth/Throat:     Pharynx: No oropharyngeal exudate.  Eyes:     General: No scleral icterus.    Conjunctiva/sclera: Conjunctivae normal.     Pupils: Pupils are equal, round, and reactive to light.  Neck:     Vascular: No JVD.  Cardiovascular:     Rate and Rhythm: Normal rate and regular rhythm.  Pulmonary:     Effort: Pulmonary effort is normal. No respiratory distress.     Breath sounds: No  wheezing.  Chest:     Chest wall: No tenderness.  Abdominal:     General: There is no distension.  Musculoskeletal:        General: No tenderness.     Cervical back: Normal range of motion and neck supple.  Lymphadenopathy:     Cervical: No cervical adenopathy.  Skin:    General: Skin is warm and dry.     Coloration: Skin is not pale.     Findings: No erythema.  Neurological:     General: No focal deficit present.     Mental Status: He is alert and oriented to person, place, and time.     Motor: No abnormal muscle tone.     Coordination: Coordination normal.     Deep Tendon Reflexes: Reflexes are normal and symmetric.  Psychiatric:        Mood and Affect: Mood normal.        Behavior: Behavior normal.        Thought Content: Thought content normal.        Judgment: Judgment normal.           Assessment & Plan:   HIV: continue Biktarvy  Chronic Hepatitis B without hepatic coma or delta agent: On TAF and does need US abdomen periodically I am ordering today also see if his hepatitis B surface antigen still positive  Sternal osteomyelitis: appears resolved  Hyperlipidemia continue statin.  CAD: being followed by [facility]   Hypertension:  PCP managing this   Hyperglycemia: Check A1c

## 2020-04-22 LAB — HEMOGLOBIN A1C
Hgb A1c MFr Bld: 6.4 % of total Hgb — ABNORMAL HIGH (ref <5.7)
Mean Plasma Glucose: 137 (calc)
eAG (mmol/L): 7.6 (calc)

## 2020-04-22 LAB — HEPATITIS B SURFACE ANTIGEN: Hepatitis B Surface Ag: REACTIVE — AB

## 2020-04-29 MED ORDER — PANTOPRAZOLE SODIUM 40 MG PO TBEC
40.0000 mg | DELAYED_RELEASE_TABLET | Freq: Every day | ORAL | 3 refills | Status: DC
Start: 2020-04-29 — End: 2021-04-28

## 2020-05-06 ENCOUNTER — Telehealth: Payer: Self-pay | Admitting: *Deleted

## 2020-05-06 ENCOUNTER — Ambulatory Visit
Admission: RE | Admit: 2020-05-06 | Discharge: 2020-05-06 | Disposition: A | Discharge status: Home / Self Care | Payer: Medicare Other | Source: Ambulatory Visit | Attending: Infectious Disease | Admitting: Infectious Disease

## 2020-05-06 DIAGNOSIS — B20 Human immunodeficiency virus [HIV] disease: Secondary | ICD-10-CM

## 2020-05-06 NOTE — Telephone Encounter (Signed)
We can check his PT and INR but they can also do that as well

## 2020-05-06 NOTE — Telephone Encounter (Signed)
Do you need additional lab work before hepatology referral? Landis Gandy, RN

## 2020-05-06 NOTE — Telephone Encounter (Signed)
-----   Message from Truman Hayward, MD sent at 05/06/2020  2:11 PM EST ----- Jaquese's US shows cirrhosis of the liver. I wonder if this is from etoh? His HBV should be controlled? Should maybe connect him to hepatology

## 2020-05-07 ENCOUNTER — Other Ambulatory Visit: Payer: Self-pay | Admitting: Infectious Disease

## 2020-05-07 DIAGNOSIS — K7460 Unspecified cirrhosis of liver: Secondary | ICD-10-CM

## 2020-05-07 NOTE — Telephone Encounter (Signed)
I put in order for Hepatology for Dale Mcdonald thanks so much!

## 2020-05-07 NOTE — Telephone Encounter (Signed)
Please note new referral. Thanks

## 2020-05-07 NOTE — Telephone Encounter (Signed)
Spoke with patient, he states that he has been limiting is alcohol intake to 1 beer a month.  Patient agrees to Hepatology referral and agrees to have any additional labs done with hepatology office [facility]

## 2020-06-03 ENCOUNTER — Other Ambulatory Visit: Payer: Medicare Other

## 2020-06-03 DIAGNOSIS — Z20822 Contact with and (suspected) exposure to covid-19: Secondary | ICD-10-CM | POA: Diagnosis not present

## 2020-06-04 LAB — SPECIMEN STATUS REPORT

## 2020-06-04 LAB — NOVEL CORONAVIRUS, NAA: SARS-CoV-2, NAA: NOT DETECTED

## 2020-06-04 LAB — SARS-COV-2, NAA 2 DAY TAT

## 2020-07-30 ENCOUNTER — Ambulatory Visit: Payer: Medicare Other | Admitting: Internal Medicine

## 2020-08-02 NOTE — Telephone Encounter (Signed)
Patient walked into office and LPN spoke with Dale Mcdonald who agrees to send requested information to [facility]. Patient made aware while in the office.  Dale Mcdonald

## 2020-08-05 ENCOUNTER — Other Ambulatory Visit: Payer: Self-pay

## 2020-08-05 ENCOUNTER — Encounter: Payer: Self-pay | Admitting: Internal Medicine

## 2020-08-05 ENCOUNTER — Ambulatory Visit: Payer: Medicare Other | Admitting: Internal Medicine

## 2020-08-05 ENCOUNTER — Other Ambulatory Visit (INDEPENDENT_AMBULATORY_CARE_PROVIDER_SITE_OTHER): Payer: Medicare Other

## 2020-08-05 VITALS — BP 124/68 | HR 63 | Ht 70.5 in | Wt 191.0 lb

## 2020-08-05 DIAGNOSIS — K7460 Unspecified cirrhosis of liver: Secondary | ICD-10-CM | POA: Diagnosis not present

## 2020-08-05 DIAGNOSIS — Z86010 Personal history of colonic polyps: Secondary | ICD-10-CM

## 2020-08-05 DIAGNOSIS — B181 Chronic viral hepatitis B without delta-agent: Secondary | ICD-10-CM | POA: Diagnosis not present

## 2020-08-05 DIAGNOSIS — E8881 Metabolic syndrome: Secondary | ICD-10-CM

## 2020-08-05 DIAGNOSIS — E669 Obesity, unspecified: Secondary | ICD-10-CM

## 2020-08-05 LAB — FERRITIN: Ferritin: 35.7 ng/mL (ref 22.0–322.0)

## 2020-08-05 LAB — PROTIME-INR
INR: 0.9 ratio (ref 0.8–1.0)
Prothrombin Time: 10.6 s (ref 9.6–13.1)

## 2020-08-05 NOTE — Patient Instructions (Addendum)
Your provider has requested that you go to the basement level for lab work before leaving today. Press "B" on the elevator. The lab is located at the first door on the left as you exit the elevator.  Due to recent changes in healthcare laws, you may see the results of your imaging and laboratory studies on MyChart before your provider has had a chance to review them.  We understand that in some cases there may be results that are confusing or concerning to you. Not all laboratory results come back in the same time frame and the provider may be waiting for multiple results in order to interpret others.  Please give [place] 48 hours in order for your provider to thoroughly review all the results before contacting the office for clarification of your results.   You will be contacted by [place] in the next 2 days to arrange a Hepatic Ultrasound..  The number on your caller ID will be [phone], please answer when they call.  If you have not heard from them in 2 days please call [phone] to schedule.     We are giving you handouts to read today on cirrhosis, fatty liver.  I appreciate the opportunity to care for you. Silvano Rusk, MD, [facility]

## 2020-08-05 NOTE — Progress Notes (Signed)
Dale Mcdonald 70 y.o. 16-Dec-1950 [id number] Referred by: Dr. Rhina Brack[place]  Assessment & Plan:   Encounter Diagnoses  Name Primary?   Cirrhosis of liver without ascites, unspecified hepatic cirrhosis type (HCC) Yes   Chronic hepatitis B virus infection ([place])    Abdominal obesity and metabolic syndrome    Hx of adenomatous polyp of colon    If [name] truly has cirrhosis it seems to be well compensated at this time.  It seems unlikely to me that hepatitis B is the culprit rather he seems to be set up for nonalcoholic fatty liver disease.  I explained all this to him today and gave him  handouts regarding cirrhosis and fatty liver disease.  He does need a complete serologic work-up to exclude other causes as well.  No it seems likely that his hepatitis B is under control I think it is important to check DNA level of that again.  The side effect profile of Biktarvy does include reactivation of hepatitis B.  I will see him back in 6 to 8 weeks after this work-up is completed.  The elastography will help [place] better understand his liver disease and whether or not he truly has cirrhosis.  His platelets are normal now so that goes against significant portal hypertension.  He asked about drinking alcohol and he might have 4-6 drinks on special occasions throughout the year and at this point I do not think that is a problem  I have given him information about diet modification for lower carbohydrate eating and restricted feeding/intermittent fasting possibly to study at this point and to consider implementing some of those techniques.  Orders Placed This Encounter  Procedures   US ABDOMEN COMPLETE W/ELASTOGRAPHY   Hepatitis B DNA, ultraquantitative, PCR   Protime-INR   ANA   Alpha-1-antitrypsin   Anti-smooth muscle antibody, IgG   Ferritin   AFP tumor marker   Mitochondrial antibodies   Diminutive adenoma on colonoscopy 2019.  Original recall at that time was for 5  years newer guidelines would suggest starting at 7 and his latest 10 years.   I appreciate the opportunity to care for this patient. CC: Dr. Antony Salmon Dahm [name], MD  Subjective:   Chief Complaint: Cirrhosis  HPI 70 year old white man with chronic hepatitis B and HIV referred for evaluation of cirrhosis found on ultrasound.  Images are reviewed.  The patient is asymptomatic.  Rare alcohol maybe a few times a year at special occasions.  Does have a history of coronary artery disease status post coronary artery bypass grafting and also mitral valve replacement (bioprosthetic) in 5361 complicated by MRSA infection.  He also has an ischemic cardiomyopathy.  He is followed at [facility] for all of that.  AICD in place.  He has had treatment for his hepatitis B and HIV with viral inhibitors for many years.  Current therapy is bictegravir-emtrictatine-tenofovir and his last hepatitis B DNA in 2017 was undetectable.  He still has surface antigen positive.  He is E antigen positive.  No family history of liver disease though there is family history of diabetes and heart disease.   Lab Results  Component Value Date   ALT 47 (H) 04/02/2020   AST 42 (H) 04/02/2020   ALKPHOS 98 12/28/2016   BILITOT 0.7 04/02/2020       CLINICAL DATA:  Chronic hepatitis-B  EXAM: ULTRASOUND ABDOMEN LIMITED RIGHT UPPER QUADRANT  COMPARISON:  CT abdomen and pelvis 11/14/2008  FINDINGS: Gallbladder:  Normally  distended without stones or wall thickening. No pericholecystic fluid or sonographic Murphy sign.  Common bile duct:  Diameter: 3 mm, normal  Liver:  Coarsened increased intrahepatic echogenicity with nodular hepatic contours consistent with cirrhosis. No discrete hepatic mass is visualized. No intrahepatic biliary dilatation. Portal vein is patent on color Doppler imaging with normal direction of blood flow towards the liver.  Other: No RIGHT upper quadrant free  fluid.  IMPRESSION: Cirrhotic appearing liver without discrete mass.  Otherwise negative RIGHT upper quadrant ultrasound.   Electronically Signed   By: Lavonia Dana M.D.   On: 05/06/2020 13:55    Lab Results  Component Value Date   WBC 4.9 04/02/2020   HGB 14.5 04/02/2020   HCT 42.2 04/02/2020   MCV 95.9 04/02/2020   PLT 214 04/02/2020   He has a history of colon polyps previously had colonoscopy by Dr. Vicente Males in September 2019 where he had a single adenoma in the transverse colon and a descending hyperplastic and lymphoid follicle.  Maximum size of these polyps 6 mm.  He had a distal hyperplastic polyp on colonoscopy performed by me in 2008.  Allergies  Allergen Reactions   Magnesium Nausea Only   Current Meds  Medication Sig   allopurinol (ZYLOPRIM) 300 MG tablet Take 1 tablet (300 mg total) by mouth daily.   aspirin EC 81 MG tablet Take 81 mg by mouth daily.   atorvastatin (LIPITOR) 20 MG tablet TAKE 1 TABLET BY MOUTH  DAILY   bictegravir-emtricitabine-tenofovir AF (BIKTARVY) 50-200-25 MG TABS tablet Take 1 tablet by mouth daily.   carvedilol (COREG) 3.125 MG tablet Take 3.125 mg by mouth 2 (two) times daily.   colchicine 0.6 MG tablet Take 1 tablet (0.6 mg total) by mouth 2 (two) times daily as needed.   ENTRESTO 49-51 MG Take 1 tablet by mouth 2 (two) times daily.   fluconazole (DIFLUCAN) 150 MG tablet Take 150 mg by mouth once a week. prn   Melatonin 5 MG TABS Take 5 mg by mouth at bedtime.    Multiple Vitamin (MULTI-VITAMIN) tablet Take by mouth.   pantoprazole (PROTONIX) 40 MG tablet Take 1 tablet (40 mg total) by mouth daily.   spironolactone (ALDACTONE) 25 MG tablet Take 25 mg by mouth daily.   torsemide (DEMADEX) 20 MG tablet 1-2 tabs daily as needed   traZODone (DESYREL) 50 MG tablet TAKE 1-2 TABLETS (50-100 MG TOTAL) BY MOUTH AT BEDTIME.   valACYclovir (VALTREX) 500 MG tablet Take 1 tablet (500 mg total) by mouth daily as needed (for fever  blisters.).   Past Medical History:  Diagnosis Date   Anxiety 03/29/2017   Heart murmur    Hepatitis    B   HIV (human immunodeficiency virus infection) ([place])    Hx of adenomatous polyp of colon 02/08/2018   Hyperglycemia 04/17/2020   Hyperlipidemia    Hypertension    Palpitations    Past Surgical History:  Procedure Laterality Date   CATARACT EXTRACTION W/PHACO Left 11/11/2017   Procedure: CATARACT EXTRACTION PHACO AND INTRAOCULAR LENS PLACEMENT ([place]);  Surgeon: Birder Robson, MD;  Location: [facility] ORS;  Service: Ophthalmology;  Laterality: Left;  [place] 00:59.3 AP% 19.5 CDE 11.54 Fluid pack lot # [id number]   CATARACT EXTRACTION W/PHACO Right 12/21/2017   Procedure: CATARACT EXTRACTION PHACO AND INTRAOCULAR LENS PLACEMENT (IOC);  Surgeon: Birder Robson, MD;  Location: [facility] ORS;  Service: Ophthalmology;  Laterality: Right;  [place] 00:40 AP% 13.5 CDE 5.50 Fluid pack lot $ [id number]   COLONOSCOPY  COLONOSCOPY WITH PROPOFOL N/A 02/08/2018   Procedure: COLONOSCOPY WITH PROPOFOL;  Surgeon: Jonathon Bellows, MD;  Location: [facility] ENDOSCOPY;  Service: Gastroenterology;  Laterality: N/A;   CORONARY ARTERY BYPASS GRAFT  04/07/2018   EYE SURGERY     RETINA   HERNIA REPAIR     MITRAL VALVE REPLACEMENT  03/2018   bovine--with CABG   Social History   Social History Narrative   Retired and widowed   1 son   Former smoker   3 caffeinated beverages a day   No alcohol no drug use no smokeless tobacco      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  All other review of systems are negative.  Objective:   Physical Exam BP  124/68    Pulse 63    Ht 5' 10.5" (1.791 m)    Wt 191 lb (86.6 kg)    BMI 27.02 kg/m  WDWN NAD Eyes anicteric Lungs cta Cor NL S1S2 no rmg abd obese soft NT and no HSM Skin no stigmata CLD Alert ad oriented x 3  Data reviewed see HPI but note that includes lab review from 2022 2021, further back on hepatitis studies recent primary care notes and infectious disease note of November 2021.

## 2020-08-08 LAB — HEPATITIS B DNA, ULTRAQUANTITATIVE, PCR
Hepatitis B DNA (Calc): <1 Log IU/mL
Hepatitis B DNA: <10 IU/mL

## 2020-08-08 LAB — ANA: Anti Nuclear Antibody (ANA): NEGATIVE

## 2020-08-08 LAB — ANTI-SMOOTH MUSCLE ANTIBODY, IGG: Actin (Smooth Muscle) Antibody (IGG): <20 U (ref <20)

## 2020-08-08 LAB — ALPHA-1-ANTITRYPSIN: A-1 Antitrypsin, Ser: 182 mg/dL (ref 83–199)

## 2020-08-08 LAB — AFP TUMOR MARKER: AFP-Tumor Marker: 2.7 ng/mL (ref <6.1)

## 2020-08-08 LAB — MITOCHONDRIAL ANTIBODIES: Mitochondrial M2 Ab, IgG: <=20 U

## 2020-08-09 DIAGNOSIS — Z953 Presence of xenogenic heart valve: Secondary | ICD-10-CM | POA: Diagnosis not present

## 2020-08-12 ENCOUNTER — Ambulatory Visit (HOSPITAL_BASED_OUTPATIENT_CLINIC_OR_DEPARTMENT_OTHER)
Admission: RE | Admit: 2020-08-12 | Discharge: 2020-08-12 | Disposition: A | Discharge status: Home / Self Care | Payer: Medicare Other | Source: Ambulatory Visit | Attending: Internal Medicine | Admitting: Internal Medicine

## 2020-08-12 ENCOUNTER — Other Ambulatory Visit: Payer: Self-pay

## 2020-08-12 DIAGNOSIS — K7460 Unspecified cirrhosis of liver: Secondary | ICD-10-CM | POA: Insufficient documentation

## 2020-08-12 DIAGNOSIS — K7689 Other specified diseases of liver: Secondary | ICD-10-CM | POA: Diagnosis not present

## 2020-08-26 DIAGNOSIS — H43811 Vitreous degeneration, right eye: Secondary | ICD-10-CM | POA: Diagnosis not present

## 2020-09-03 DIAGNOSIS — Z20822 Contact with and (suspected) exposure to covid-19: Secondary | ICD-10-CM | POA: Diagnosis not present

## 2020-09-03 DIAGNOSIS — Z03818 Encounter for observation for suspected exposure to other biological agents ruled out: Secondary | ICD-10-CM | POA: Diagnosis not present

## 2020-09-24 ENCOUNTER — Encounter: Payer: Self-pay | Admitting: Internal Medicine

## 2020-09-24 ENCOUNTER — Ambulatory Visit: Payer: Medicare Other | Admitting: Internal Medicine

## 2020-09-24 VITALS — BP 104/50 | HR 92 | Ht 69.5 in | Wt 183.5 lb

## 2020-09-24 DIAGNOSIS — E8881 Metabolic syndrome: Secondary | ICD-10-CM

## 2020-09-24 DIAGNOSIS — K760 Fatty (change of) liver, not elsewhere classified: Secondary | ICD-10-CM

## 2020-09-24 DIAGNOSIS — B181 Chronic viral hepatitis B without delta-agent: Secondary | ICD-10-CM

## 2020-09-24 DIAGNOSIS — E669 Obesity, unspecified: Secondary | ICD-10-CM

## 2020-09-24 NOTE — Progress Notes (Signed)
Dale Mcdonald 70 y.o. 03/03/1951 [id number]  Assessment & Plan:   Encounter Diagnoses  Name Primary?  Marland Kitchen NAFLD (nonalcoholic fatty liver disease) Yes  . Chronic hepatitis B virus infection (Dale Mcdonald)   . Abdominal obesity and metabolic syndrome     Fortunately does not seem to have cirrhosis.  Metabolic syndrome-quite likely affecting him.  To make a true diagnosis of Dale Mcdonald would need a biopsy but he undoubtedly has some form of nonalcoholic fatty liver disease and coexisting chronic hepatitis B infection but that is under control.  He is making some inroads in diet changes and trying to work on reduced time eating i.e. time restricted eating and changing make up of his foods as previously discussed and should continue to do so.  I will see him back in 6 months sooner if needed we will plan to recheck an ultrasound at that time.  I appreciate the opportunity to care for this patient. CC: Dale Carbon, MD Dr. Rhina Mcd[place]  Subjective:   Chief Complaint: Abnormal liver  HPI Dale Mcdonald is a 70 year old man with chronic hepatitis B who was thought to have cirrhosis based upon imaging, I saw him in March of this year and ordered a work-up.  His serologic work-up for autoimmune and acquired causes an infection was negative.  That is to say other than his chronic hepatitis B.  I had suspicion of fatty liver disease. Findings as below on this exam from 08/12/2020.  IMPRESSION: ULTRASOUND ABDOMEN:  1. The liver has a heterogeneous echotexture with a nodular contour compatible with the clinical history of cirrhosis. No focal liver abnormality identified.  ULTRASOUND HEPATIC ELASTOGRAPHY:  Median kPa:  1.9  Diagnostic category:  < or = 5 kPa: high probability of being normal   He recently returned from a trip to [place] which was great but he developed a diarrheal illness for 4 days afterwards which accounts for a large amount of this weight loss as he had lost 5 or 6  pounds on his own prior to that.   Wt Readings from Last 3 Encounters:  09/24/20 183 lb 8 oz (83.2 kg)  08/05/20 191 lb (86.6 kg)  04/17/20 191 lb (86.6 kg)     Allergies  Allergen Reactions  . Magnesium Nausea Only   Current Meds  Medication Sig  . allopurinol (ZYLOPRIM) 300 MG tablet Take 1 tablet (300 mg total) by mouth daily.  Marland Kitchen aspirin EC 81 MG tablet Take 81 mg by mouth daily.  Marland Kitchen atorvastatin (LIPITOR) 20 MG tablet TAKE 1 TABLET BY MOUTH  DAILY  . bictegravir-emtricitabine-tenofovir AF (BIKTARVY) 50-200-25 MG TABS tablet Take 1 tablet by mouth daily.  . carvedilol (COREG) 3.125 MG tablet Take 3.125 mg by mouth 2 (two) times daily.  . colchicine 0.6 MG tablet Take 1 tablet (0.6 mg total) by mouth 2 (two) times daily as needed.  Marland Kitchen ENTRESTO 49-51 MG Take 1 tablet by mouth 2 (two) times daily.  . fluconazole (DIFLUCAN) 150 MG tablet Take 150 mg by mouth once a week. prn  . Melatonin 5 MG TABS Take 5 mg by mouth at bedtime.   . Multiple Vitamin (MULTI-VITAMIN) tablet Take by mouth.  . pantoprazole (PROTONIX) 40 MG tablet Take 1 tablet (40 mg total) by mouth daily.  Marland Kitchen spironolactone (ALDACTONE) 25 MG tablet Take 25 mg by mouth daily.  Marland Kitchen torsemide (DEMADEX) 20 MG tablet 1-2 tabs daily as needed  . traZODone (DESYREL) 50 MG tablet TAKE 1-2 TABLETS (50-100 MG  TOTAL) BY MOUTH AT BEDTIME.  . valACYclovir (VALTREX) 500 MG tablet Take 1 tablet (500 mg total) by mouth daily as needed (for fever blisters.).   Past Medical History:  Diagnosis Date  . Anxiety 03/29/2017  . Cirrhosis ([place])   . Heart murmur   . Hepatitis    B  . HIV (human immunodeficiency virus infection) ([place])   . Hx of adenomatous polyp of colon 02/08/2018  . Hyperglycemia 04/17/2020  . Hyperlipidemia   . Hypertension   . Palpitations    Past Surgical History:  Procedure Laterality Date  . CATARACT EXTRACTION W/PHACO Left 11/11/2017   Procedure: CATARACT EXTRACTION PHACO AND INTRAOCULAR LENS PLACEMENT (IOC);   Surgeon: Dale Robson, MD;  Location: [facility] ORS;  Service: Ophthalmology;  Laterality: Left;  [place] 00:59.3 AP% 19.5 CDE 11.54 Fluid pack lot # [id number]  . CATARACT EXTRACTION W/PHACO Right 12/21/2017   Procedure: CATARACT EXTRACTION PHACO AND INTRAOCULAR LENS PLACEMENT (IOC);  Surgeon: Dale Robson, MD;  Location: [facility] ORS;  Service: Ophthalmology;  Laterality: Right;  [place] 00:40 AP% 13.5 CDE 5.50 Fluid pack lot $ [id number]  . COLONOSCOPY    . COLONOSCOPY WITH PROPOFOL N/A 02/08/2018   Procedure: COLONOSCOPY WITH PROPOFOL;  Surgeon: Dale Bellows, MD;  Location: [facility] ENDOSCOPY;  Service: Gastroenterology;  Laterality: N/A;  . CORONARY ARTERY BYPASS GRAFT  04/07/2018  . EYE SURGERY     RETINA  . HERNIA REPAIR    . 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   Review of Systems As per HPI  Objective:   Physical Exam BP (!) 104/50 (BP Location: Left Arm, Patient Position: Sitting, Cuff Size: Normal)   Pulse 92   Ht 5' 9.5" (1.765 m) Comment: height measured without shoes  Wt 183 lb 8 oz (83.2 kg)   BMI 26.71 kg/m

## 2020-09-24 NOTE — Patient Instructions (Signed)
If you are age 69 or older, your body mass index should be between 23-30. Your Body mass index is 26.71 kg/m. If this is out of the aforementioned range listed, please consider follow up with your Primary Care Provider.  Continue your efforts with trying to lose your belly fat.   You will be due for a follow up Ultra sound in September. We will contact you with an Appointment closer to that time.  Follow up as needed for now.

## 2020-11-22 ENCOUNTER — Other Ambulatory Visit: Payer: Self-pay | Admitting: Internal Medicine

## 2020-12-24 ENCOUNTER — Other Ambulatory Visit: Payer: Self-pay | Admitting: Internal Medicine

## 2020-12-24 ENCOUNTER — Telehealth: Payer: Self-pay

## 2020-12-24 DIAGNOSIS — K760 Fatty (change of) liver, not elsewhere classified: Secondary | ICD-10-CM

## 2020-12-24 NOTE — Telephone Encounter (Signed)
I left Dale Mcdonald a detailed message that the scheduling people will be calling him to set up his RUQ U/S for a date in September. I left in the message the number to call if he misses the call is [phone].

## 2020-12-24 NOTE — Telephone Encounter (Signed)
-----   Message from Aleatha Borer, [place] sent at 09/24/2020  3:01 PM EDT ----- Regarding: Follow up U/S Patient needs to be scheduled for follow up RUQ ultra sound in September for Cirrhosis per Dr. Celesta Aver visit on 09/24/2020.

## 2021-01-02 IMAGING — US US ABDOMEN LIMITED
1 series · 14 of 25 positions shown · non-contrast
Comparison: CT abdomen and pelvis 11/14/2008

CLINICAL DATA: Chronic hepatitis-B

EXAM:
ULTRASOUND ABDOMEN LIMITED RIGHT UPPER QUADRANT

[Series 1: us abdomen limited · 0.30mm/px · 14 of 45 slices shown]
[im 1/45]
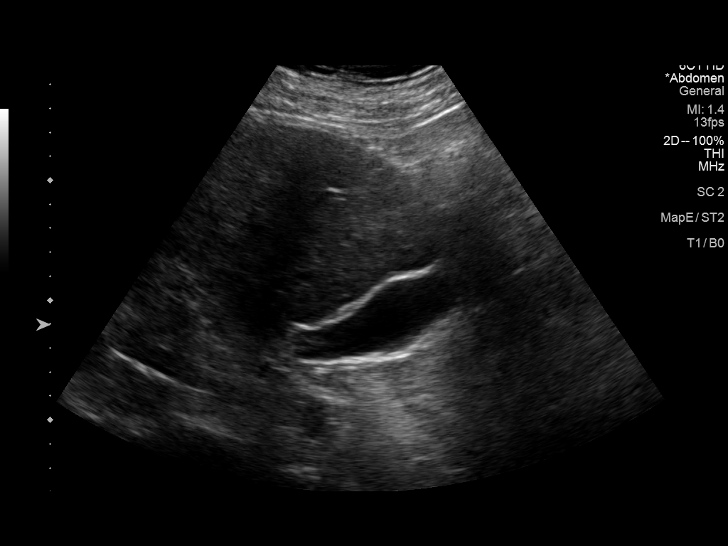
[im 4/45]
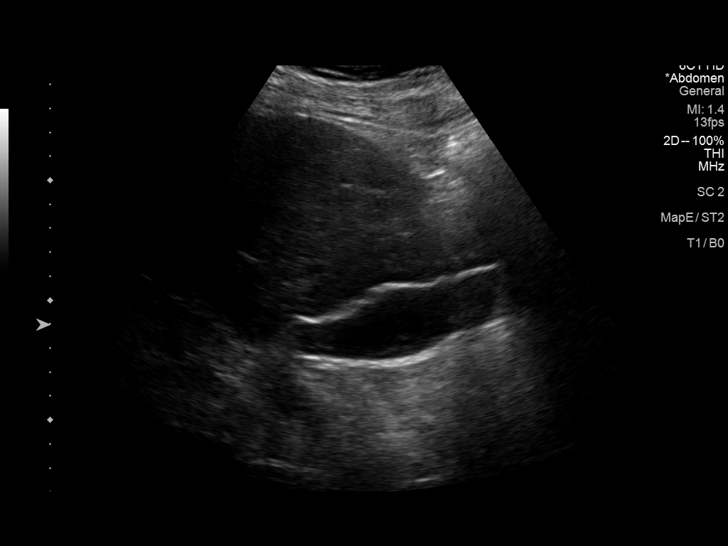
[im 8/45]
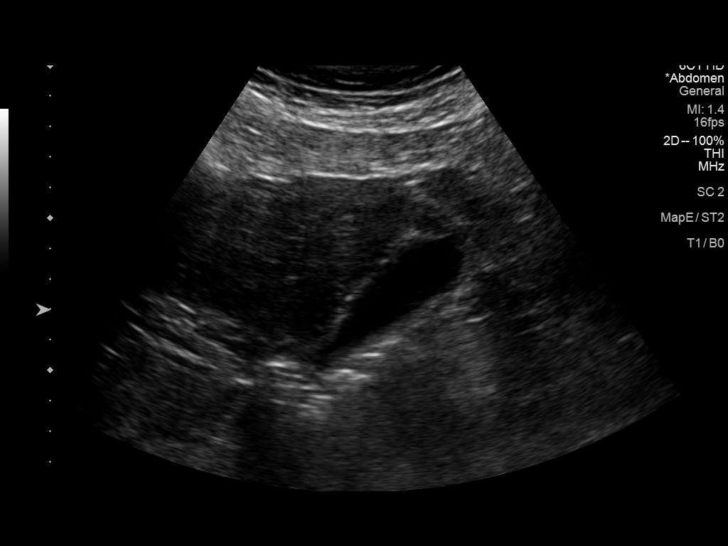
[im 12/45]
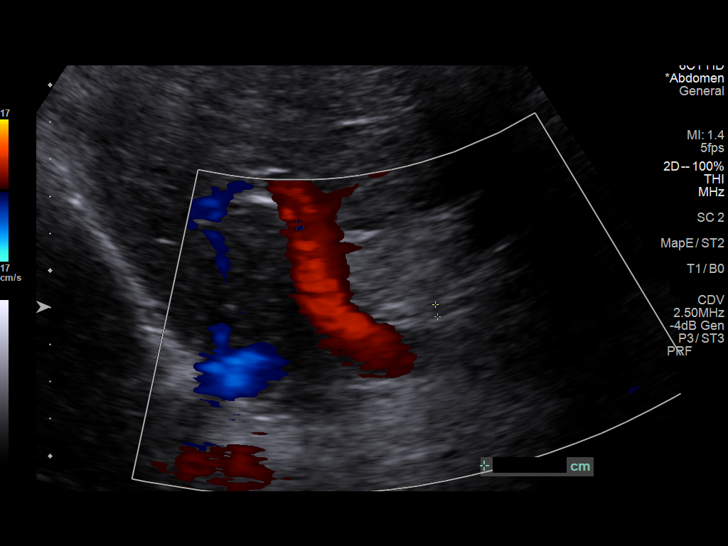
[im 15/45]
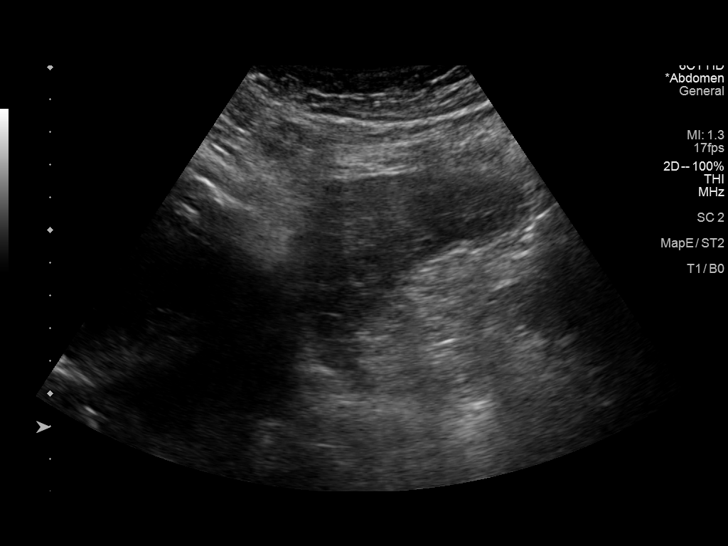
[im 17/45]
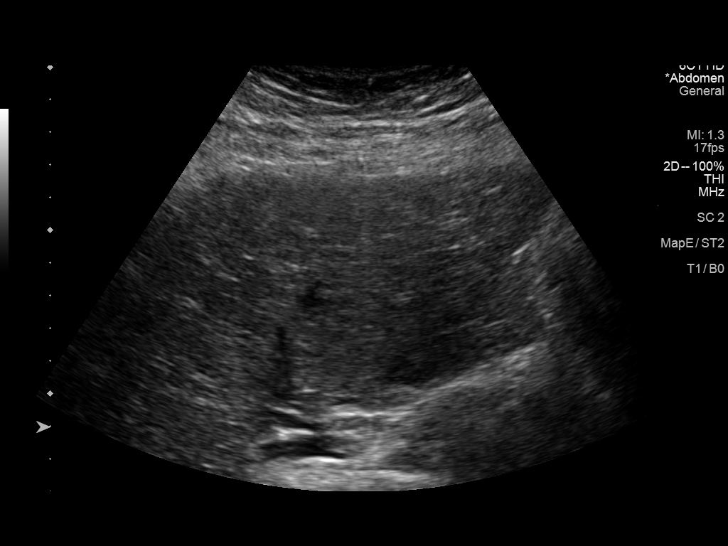
[im 21/45]
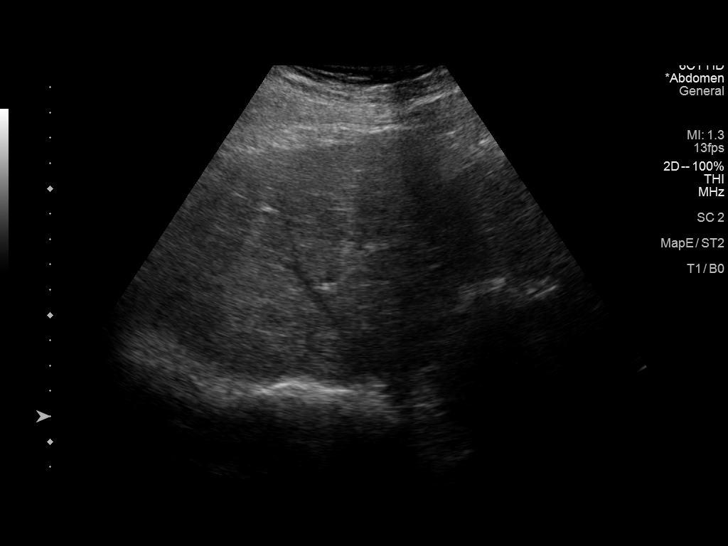
[im 24/45]
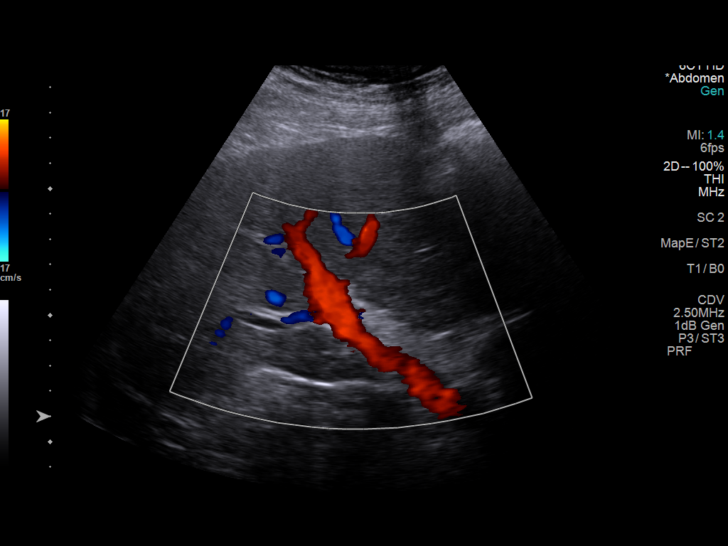
[im 28/45]
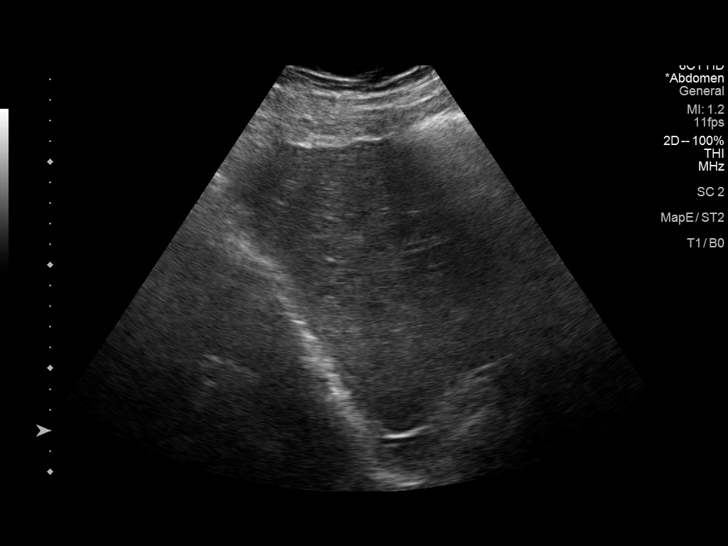
[im 30/45]
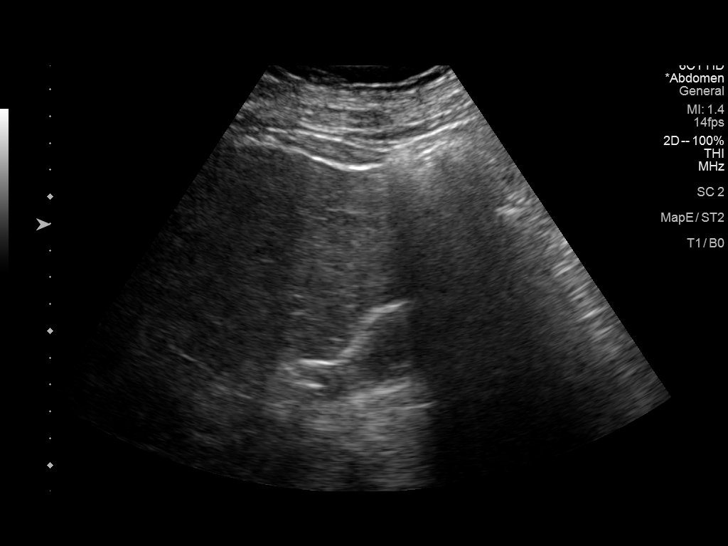
[im 34/45]
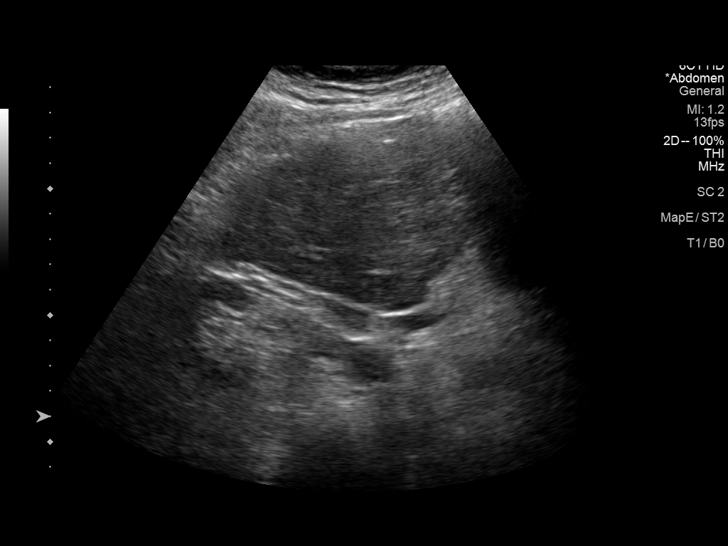
[im 37/45]
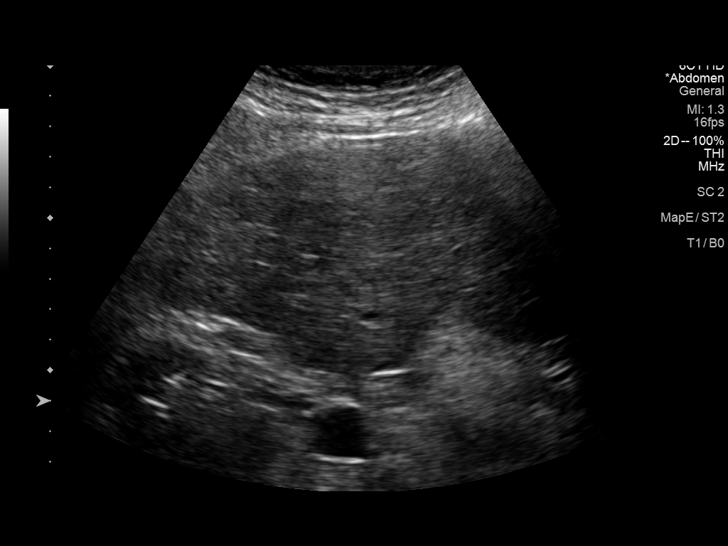
[im 41/45]
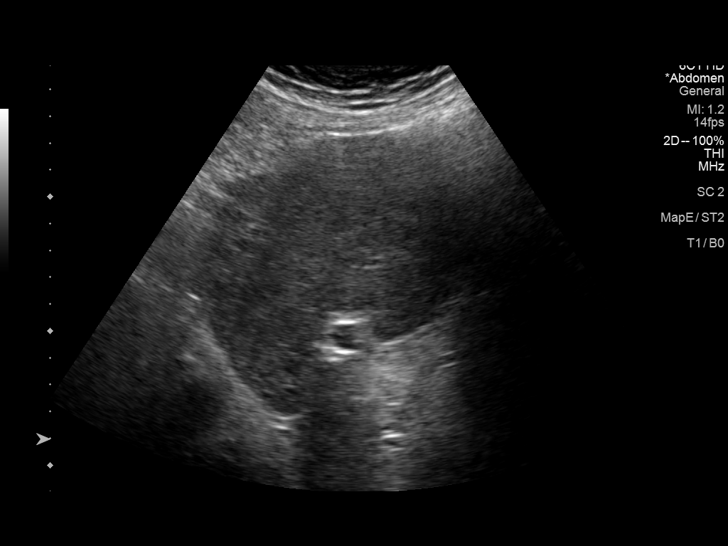
[im 45/45]
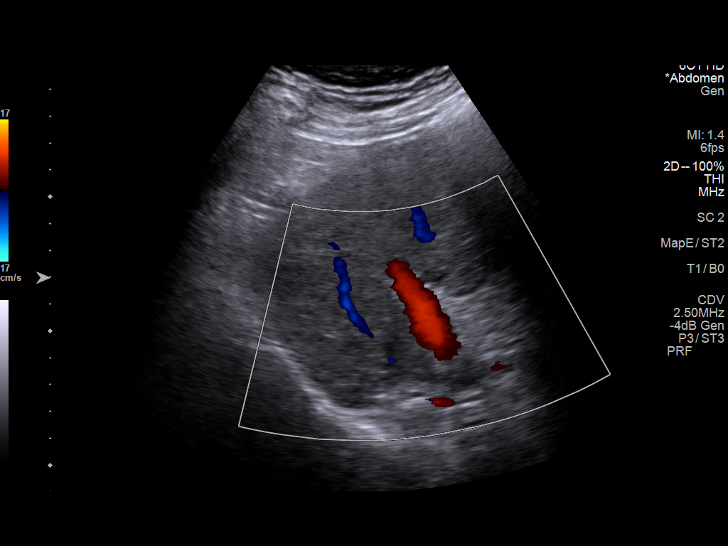

[14 of 25 positions shown; findings below may reference images not displayed]

FINDINGS: Gallbladder:

Normally distended without stones or wall thickening. No
pericholecystic fluid or sonographic Murphy sign.

Common bile duct:

Diameter: 3 mm, normal

Liver:

Coarsened increased intrahepatic echogenicity with nodular hepatic
contours consistent with cirrhosis. No discrete hepatic mass is
visualized. No intrahepatic biliary dilatation. Portal vein is
patent on color Doppler imaging with normal direction of blood flow
towards the liver.

Other: No RIGHT upper quadrant free fluid.
IMPRESSION: Cirrhotic appearing liver without discrete mass.

Otherwise negative RIGHT upper quadrant ultrasound.

## 2021-01-09 ENCOUNTER — Encounter: Payer: Self-pay | Admitting: Internal Medicine

## 2021-01-09 ENCOUNTER — Other Ambulatory Visit: Payer: Self-pay

## 2021-01-09 ENCOUNTER — Ambulatory Visit (INDEPENDENT_AMBULATORY_CARE_PROVIDER_SITE_OTHER): Payer: Medicare Other | Admitting: Internal Medicine

## 2021-01-09 VITALS — BP 124/80 | HR 84 | Temp 97.8°F | Ht 70.0 in | Wt 191.0 lb

## 2021-01-09 DIAGNOSIS — Z125 Encounter for screening for malignant neoplasm of prostate: Secondary | ICD-10-CM | POA: Diagnosis not present

## 2021-01-09 DIAGNOSIS — Z0000 Encounter for general adult medical examination without abnormal findings: Secondary | ICD-10-CM | POA: Diagnosis not present

## 2021-01-09 DIAGNOSIS — F39 Unspecified mood [affective] disorder: Secondary | ICD-10-CM

## 2021-01-09 DIAGNOSIS — N1832 Chronic kidney disease, stage 3b: Secondary | ICD-10-CM

## 2021-01-09 DIAGNOSIS — I480 Paroxysmal atrial fibrillation: Secondary | ICD-10-CM | POA: Diagnosis not present

## 2021-01-09 DIAGNOSIS — Z7189 Other specified counseling: Secondary | ICD-10-CM

## 2021-01-09 DIAGNOSIS — I5022 Chronic systolic (congestive) heart failure: Secondary | ICD-10-CM | POA: Diagnosis not present

## 2021-01-09 DIAGNOSIS — B181 Chronic viral hepatitis B without delta-agent: Secondary | ICD-10-CM | POA: Diagnosis not present

## 2021-01-09 NOTE — Assessment & Plan Note (Signed)
See social history 

## 2021-01-09 NOTE — Assessment & Plan Note (Signed)
I have personally reviewed the Medicare Annual Wellness questionnaire and have noted 1. The patient's medical and social history 2. Their use of alcohol, tobacco or illicit drugs 3. Their current medications and supplements 4. The patient's functional ability including ADL's, fall risks, home safety risks and hearing or visual             impairment. 5. Diet and physical activities 6. Evidence for depression or mood disorders  The patients weight, height, BMI and visual acuity have been recorded in the chart I have made referrals, counseling and provided education to the patient based review of the above and I have provided the pt with a written personalized care plan for preventive services.  I have provided you with a copy of your personalized plan for preventive services. Please take the time to review along with your updated medication list.  Colon due 2024 Discussed PSA---will check this year Bivalent COVID when available Flu vaccine soon Continue with exercise

## 2021-01-09 NOTE — Progress Notes (Signed)
Subjective:    Patient ID: Dale Mcdonald, male    DOB: 07-Jul-1950, 70 y.o.   MRN: [id number]  HPI Here for Medicare wellness visit and follow up of chronic health conditions This visit occurred during the SARS-CoV-2 public health emergency.  Safety protocols were in place, including screening questions prior to the visit, additional usage of staff PPE, and extensive cleaning of exam room while observing appropriate contact time as indicated for disinfecting solutions.   Reviewed form and advanced directives Reviewed other doctors Occasional alcohol Still vapes--non tobacco---discussed Vision is fine No hearing problems No falls Independent with instrumental ADLs Some "random forgetting things"--but no worrisome memory problems  Feels his energy is not great Easy DOE but still exercises (not as much but trying to increase again) The DOE is worse in the past few months--but does fine on treadmill/bike. Tough with hiking with pack, etc No chest pain No palpitations Watches weight daily--but adjusts meds if weight goes up Will have some dizziness an hour after BP meds --like if bends over and stands up (has documented 104/58 then)  HIV controlled--undetectable levels Hep B infection also controlled  Mood is okay Uses trazodone for sleep---doesn't sleep without it (occ needs 2) Some anxiety--not often. No depression  NASH now noted Getting another ultrasound this fall  Most recent GFR up to 47 Still on ARB  On protonix every other day No heartburn or dysphagia with this  Stopped the colchicine No recent gout problems  Current Outpatient Medications on File Prior to Visit  Medication Sig Dispense Refill   allopurinol (ZYLOPRIM) 300 MG tablet Take 1 tablet (300 mg total) by mouth daily. 90 tablet 3   aspirin EC 81 MG tablet Take 81 mg by mouth daily.     atorvastatin (LIPITOR) 20 MG tablet TAKE 1 TABLET BY MOUTH  DAILY 90 tablet 3   bictegravir-emtricitabine-tenofovir AF  (BIKTARVY) 50-200-25 MG TABS tablet Take 1 tablet by mouth daily. 30 tablet 11   carvedilol (COREG) 3.125 MG tablet Take 3.125 mg by mouth 2 (two) times daily.     colchicine 0.6 MG tablet Take 1 tablet (0.6 mg total) by mouth 2 (two) times daily as needed. 180 tablet 3   ENTRESTO 49-51 MG Take 1 tablet by mouth 2 (two) times daily.     fluconazole (DIFLUCAN) 150 MG tablet Take 150 mg by mouth once a week. prn     Melatonin 5 MG TABS Take 5 mg by mouth at bedtime.      Multiple Vitamin (MULTI-VITAMIN) tablet Take by mouth.     pantoprazole (PROTONIX) 40 MG tablet Take 1 tablet (40 mg total) by mouth daily. 90 tablet 3   spironolactone (ALDACTONE) 25 MG tablet Take 25 mg by mouth daily.     torsemide (DEMADEX) 20 MG tablet 1-2 tabs daily as needed     traZODone (DESYREL) 50 MG tablet TAKE 1-2 TABLETS (50-100 MG TOTAL) BY MOUTH AT BEDTIME. 180 tablet 3   valACYclovir (VALTREX) 500 MG tablet Take 1 tablet (500 mg total) by mouth daily as needed (for fever blisters.). 30 tablet 5   No current facility-administered medications on file prior to visit.    Allergies  Allergen Reactions   Magnesium Nausea Only    Past Medical History:  Diagnosis Date   Anxiety 03/29/2017   Cirrhosis ([place])    Heart murmur    Hepatitis    B   HIV (human immunodeficiency virus infection) ([place])    Hx of adenomatous polyp  of colon 02/08/2018   Hyperglycemia 04/17/2020   Hyperlipidemia    Hypertension    Palpitations     Past Surgical History:  Procedure Laterality Date   CATARACT EXTRACTION W/PHACO Left 11/11/2017   Procedure: CATARACT EXTRACTION PHACO AND INTRAOCULAR LENS PLACEMENT (IOC);  Surgeon: Birder Robson, MD;  Location: [facility] ORS;  Service: Ophthalmology;  Laterality: Left;  [place] 00:59.3 AP% 19.5 CDE 11.54 Fluid pack lot # [id number]   CATARACT EXTRACTION W/PHACO Right 12/21/2017   Procedure: CATARACT EXTRACTION PHACO AND INTRAOCULAR LENS PLACEMENT (IOC);  Surgeon: Birder Robson, MD;  Location:  [facility] ORS;  Service: Ophthalmology;  Laterality: Right;  [place] 00:40 AP% 13.5 CDE 5.50 Fluid pack lot $ [id number]   COLONOSCOPY     COLONOSCOPY WITH PROPOFOL N/A 02/08/2018   Procedure: COLONOSCOPY WITH PROPOFOL;  Surgeon: Jonathon Bellows, MD;  Location: [facility] ENDOSCOPY;  Service: Gastroenterology;  Laterality: N/A;   CORONARY ARTERY BYPASS GRAFT  04/07/2018   EYE SURGERY     RETINA   HERNIA REPAIR     MITRAL VALVE REPLACEMENT  03/2018   bovine--with CABG    History reviewed. No pertinent family history.  Social History   Socioeconomic History   Marital status: Widowed    Spouse name: Not on file   Number of children: 1   Years of education: Not on file   Highest education level: Not on file  Occupational History   Occupation: VICE PRESIDENT--Purchasing---retired    Comment: [employer]  Tobacco Use   Smoking status: Former    Packs/day: 2.00    Years: 40.00    Pack years: 80.00    Types: Cigarettes, E-cigarettes   Smokeless tobacco: Never   Tobacco comments:    Quit 05/25/2017/ cutting back  Substance and Sexual Activity   Alcohol use: No    Alcohol/week: 0.0 standard drinks   Drug use: No   Sexual activity: Not on file  Other Topics Concern   Not on file  Social History Narrative   Retired and widowed   1 son   Former smoker   3 caffeinated beverages a day   No alcohol no drug use no smokeless tobacco      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, 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 [employer]      No living will   Would want son Dale Mcdonald to be Media planner   Would accept resuscitation but no prolonged ventilation   Not sure about tube feeds--but definitely not long term   Social Determinants of Radio broadcast assistant Strain: Not on file  Food Insecurity: Not on file  Transportation Needs: Not on file  Physical Activity: Not on file  Stress: Not on file  Social Connections: Not on file  Intimate Partner Violence: Not  on file   Review of Systems Appetite is okay Trying to lose weight--but hasn't been able to Has lost his taste and smell--no documented COVID Wears seat belt Having ongoing dental care---had implants and will need crowns Bowels move fine--no blood Voids fine---stream is slower but stops fine. Occasional nocturia Scattered aches and pains (like after treadmill--will feel it in his right hip) No suspicious skin lesions---needs new derm    Objective:   Physical Exam Constitutional:      Appearance: Normal appearance.  HENT:     Mouth/Throat:     Comments: No lesions Eyes:     Conjunctiva/sclera: Conjunctivae normal.     Pupils: Pupils are equal, round,  and reactive to light.  Cardiovascular:     Rate and Rhythm: Normal rate and regular rhythm.     Pulses: Normal pulses.     Heart sounds: No murmur heard.   No gallop.  Pulmonary:     Effort: Pulmonary effort is normal.     Breath sounds: Normal breath sounds. No wheezing or rales.  Abdominal:     Palpations: Abdomen is soft.     Tenderness: There is no abdominal tenderness.  Musculoskeletal:     Right lower leg: No edema.     Left lower leg: No edema.  Skin:    General: Skin is warm.     Findings: No rash.  Neurological:     Mental Status: He is alert and oriented to person, place, and time.     Comments: President--- "Zoila Shutter, Obama" [id number] D-l-r-[id number]-w Recall 3/3  Psychiatric:        Mood and Affect: Mood normal.        Behavior: Behavior normal.           Assessment & Plan:

## 2021-01-09 NOTE — Assessment & Plan Note (Signed)
Quiet on Rx

## 2021-01-09 NOTE — Assessment & Plan Note (Signed)
Mood is better Still uses the trazodone for sleep

## 2021-01-09 NOTE — Assessment & Plan Note (Signed)
Regular now On ASA

## 2021-01-09 NOTE — Assessment & Plan Note (Signed)
Fluid status neutral but some increase in DOE Will check labs He will discuss with cardiologist On entresto, carvedilol, ASA, spironolactone, torsemide

## 2021-01-09 NOTE — Assessment & Plan Note (Signed)
GFR was up last time Will recheck On valsartan

## 2021-01-09 NOTE — Progress Notes (Signed)
Hearing Screening  Method: Audiometry   500Hz 1000Hz 2000Hz 4000Hz  Right ear 20 20 20 20  Left ear 20 20 20 20  Vision Screening - Comments:: April 2022  

## 2021-01-10 LAB — CBC
HCT: 39.6 % (ref 39.0–52.0)
Hemoglobin: 13.6 g/dL (ref 13.0–17.0)
MCHC: 34.3 g/dL (ref 30.0–36.0)
MCV: 96.6 fl (ref 78.0–100.0)
Platelets: 179 10*3/uL (ref 150.0–400.0)
RBC: 4.1 Mil/uL — ABNORMAL LOW (ref 4.22–5.81)
RDW: 14 % (ref 11.5–15.5)
WBC: 6.5 10*3/uL (ref 4.0–10.5)

## 2021-01-10 LAB — VITAMIN D 25 HYDROXY (VIT D DEFICIENCY, FRACTURES): VITD: 27.65 ng/mL — ABNORMAL LOW (ref 30.00–100.00)

## 2021-01-10 LAB — LIPID PANEL
Cholesterol: 138 mg/dL (ref 0–200)
HDL: 52 mg/dL (ref >39.00)
LDL Cholesterol: 60 mg/dL (ref 0–99)
NonHDL: 85.85
Total CHOL/HDL Ratio: 3
Triglycerides: 130 mg/dL (ref 0.0–149.0)
VLDL: 26 mg/dL (ref 0.0–40.0)

## 2021-01-10 LAB — HEPATIC FUNCTION PANEL
ALT: 32 U/L (ref 0–53)
AST: 25 U/L (ref 0–37)
Albumin: 4.4 g/dL (ref 3.5–5.2)
Alkaline Phosphatase: 98 U/L (ref 39–117)
Bilirubin, Direct: 0.1 mg/dL (ref 0.0–0.3)
Total Bilirubin: 0.6 mg/dL (ref 0.2–1.2)
Total Protein: 7.2 g/dL (ref 6.0–8.3)

## 2021-01-10 LAB — T4, FREE: Free T4: 1.05 ng/dL (ref 0.60–1.60)

## 2021-01-10 LAB — VITAMIN B12: Vitamin B-12: 253 pg/mL (ref 211–911)

## 2021-01-10 LAB — RENAL FUNCTION PANEL
Albumin: 4.4 g/dL (ref 3.5–5.2)
BUN: 24 mg/dL — ABNORMAL HIGH (ref 6–23)
CO2: 27 mEq/L (ref 19–32)
Calcium: 9.7 mg/dL (ref 8.4–10.5)
Chloride: 98 mEq/L (ref 96–112)
Creatinine, Ser: 1.93 mg/dL — ABNORMAL HIGH (ref 0.40–1.50)
GFR: 34.9 mL/min — ABNORMAL LOW (ref >60.00)
Glucose, Bld: 79 mg/dL (ref 70–99)
Phosphorus: 3.6 mg/dL (ref 2.3–4.6)
Potassium: 4.1 mEq/L (ref 3.5–5.1)
Sodium: 137 mEq/L (ref 135–145)

## 2021-01-10 LAB — PSA, MEDICARE: PSA: 0.56 ng/ml (ref 0.10–4.00)

## 2021-01-10 LAB — PARATHYROID HORMONE, INTACT (NO CA): PTH: 26 pg/mL (ref 16–77)

## 2021-01-22 DIAGNOSIS — I5020 Unspecified systolic (congestive) heart failure: Secondary | ICD-10-CM | POA: Diagnosis not present

## 2021-01-30 DIAGNOSIS — I5022 Chronic systolic (congestive) heart failure: Secondary | ICD-10-CM | POA: Diagnosis not present

## 2021-03-11 DIAGNOSIS — I5022 Chronic systolic (congestive) heart failure: Secondary | ICD-10-CM | POA: Diagnosis not present

## 2021-03-11 DIAGNOSIS — I110 Hypertensive heart disease with heart failure: Secondary | ICD-10-CM | POA: Diagnosis not present

## 2021-03-11 DIAGNOSIS — Z79899 Other long term (current) drug therapy: Secondary | ICD-10-CM | POA: Diagnosis not present

## 2021-03-24 ENCOUNTER — Other Ambulatory Visit: Payer: Self-pay

## 2021-03-24 ENCOUNTER — Other Ambulatory Visit: Payer: Medicare Other

## 2021-03-24 DIAGNOSIS — R739 Hyperglycemia, unspecified: Secondary | ICD-10-CM

## 2021-03-24 DIAGNOSIS — B181 Chronic viral hepatitis B without delta-agent: Secondary | ICD-10-CM

## 2021-03-24 DIAGNOSIS — I2510 Atherosclerotic heart disease of native coronary artery without angina pectoris: Secondary | ICD-10-CM | POA: Diagnosis not present

## 2021-03-24 DIAGNOSIS — I2583 Coronary atherosclerosis due to lipid rich plaque: Secondary | ICD-10-CM

## 2021-03-24 DIAGNOSIS — N1832 Chronic kidney disease, stage 3b: Secondary | ICD-10-CM

## 2021-03-24 DIAGNOSIS — Z951 Presence of aortocoronary bypass graft: Secondary | ICD-10-CM

## 2021-03-24 DIAGNOSIS — I10 Essential (primary) hypertension: Secondary | ICD-10-CM

## 2021-03-24 DIAGNOSIS — I5022 Chronic systolic (congestive) heart failure: Secondary | ICD-10-CM | POA: Diagnosis not present

## 2021-03-24 DIAGNOSIS — B20 Human immunodeficiency virus [HIV] disease: Secondary | ICD-10-CM

## 2021-03-24 DIAGNOSIS — M1000 Idiopathic gout, unspecified site: Secondary | ICD-10-CM

## 2021-03-24 DIAGNOSIS — I517 Cardiomegaly: Secondary | ICD-10-CM

## 2021-03-25 LAB — T-HELPER CELL (CD4) - (RCID CLINIC ONLY)
CD4 % Helper T Cell: 34 % (ref 33–65)
CD4 T Cell Abs: 667 /uL (ref 400–1790)

## 2021-03-26 ENCOUNTER — Ambulatory Visit: Payer: Medicare Other

## 2021-03-27 LAB — CBC WITH DIFFERENTIAL/PLATELET
Absolute Monocytes: 551 cells/uL (ref 200–950)
Basophils Absolute: 31 cells/uL (ref 0–200)
Basophils Relative: 0.6 %
Eosinophils Absolute: 140 cells/uL (ref 15–500)
Eosinophils Relative: 2.7 %
HCT: 37.2 % — ABNORMAL LOW (ref 38.5–50.0)
Hemoglobin: 12.7 g/dL — ABNORMAL LOW (ref 13.2–17.1)
Lymphs Abs: 1997 cells/uL (ref 850–3900)
MCH: 33.5 pg — ABNORMAL HIGH (ref 27.0–33.0)
MCHC: 34.1 g/dL (ref 32.0–36.0)
MCV: 98.2 fL (ref 80.0–100.0)
MPV: 10.1 fL (ref 7.5–12.5)
Monocytes Relative: 10.6 %
Neutro Abs: 2480 cells/uL (ref 1500–7800)
Neutrophils Relative %: 47.7 %
Platelets: 183 10*3/uL (ref 140–400)
RBC: 3.79 10*6/uL — ABNORMAL LOW (ref 4.20–5.80)
RDW: 13 % (ref 11.0–15.0)
Total Lymphocyte: 38.4 %
WBC: 5.2 10*3/uL (ref 3.8–10.8)

## 2021-03-27 LAB — COMPLETE METABOLIC PANEL WITH GFR
AG Ratio: 1.7 (calc) (ref 1.0–2.5)
ALT: 34 U/L (ref 9–46)
AST: 27 U/L (ref 10–35)
Albumin: 4.5 g/dL (ref 3.6–5.1)
Alkaline phosphatase (APISO): 86 U/L (ref 35–144)
BUN/Creatinine Ratio: 11 (calc) (ref 6–22)
BUN: 17 mg/dL (ref 7–25)
CO2: 26 mmol/L (ref 20–32)
Calcium: 9.6 mg/dL (ref 8.6–10.3)
Chloride: 101 mmol/L (ref 98–110)
Creat: 1.57 mg/dL — ABNORMAL HIGH (ref 0.70–1.35)
Globulin: 2.7 g/dL (calc) (ref 1.9–3.7)
Glucose, Bld: 108 mg/dL — ABNORMAL HIGH (ref 65–99)
Potassium: 4.5 mmol/L (ref 3.5–5.3)
Sodium: 137 mmol/L (ref 135–146)
Total Bilirubin: 0.5 mg/dL (ref 0.2–1.2)
Total Protein: 7.2 g/dL (ref 6.1–8.1)
eGFR: 47 mL/min/{1.73_m2} — ABNORMAL LOW (ref >=60)

## 2021-03-27 LAB — RPR: RPR Ser Ql: NONREACTIVE

## 2021-03-27 LAB — HIV-1 RNA QUANT-NO REFLEX-BLD
HIV 1 RNA Quant: NOT DETECTED Copies/mL
HIV-1 RNA Quant, Log: NOT DETECTED Log cps/mL

## 2021-04-03 DIAGNOSIS — D649 Anemia, unspecified: Secondary | ICD-10-CM

## 2021-04-03 NOTE — Telephone Encounter (Signed)
Please mail him a fecal test kit

## 2021-04-06 ENCOUNTER — Other Ambulatory Visit: Payer: Self-pay | Admitting: Internal Medicine

## 2021-04-07 ENCOUNTER — Other Ambulatory Visit: Payer: Self-pay

## 2021-04-07 ENCOUNTER — Encounter: Payer: Self-pay | Admitting: Infectious Disease

## 2021-04-07 ENCOUNTER — Ambulatory Visit: Payer: Medicare Other | Admitting: Infectious Disease

## 2021-04-07 VITALS — BP 112/72 | HR 84 | Temp 98.0°F | Resp 16 | Ht 70.5 in | Wt 193.4 lb

## 2021-04-07 DIAGNOSIS — B181 Chronic viral hepatitis B without delta-agent: Secondary | ICD-10-CM | POA: Diagnosis not present

## 2021-04-07 DIAGNOSIS — R42 Dizziness and giddiness: Secondary | ICD-10-CM | POA: Diagnosis not present

## 2021-04-07 DIAGNOSIS — D649 Anemia, unspecified: Secondary | ICD-10-CM

## 2021-04-07 DIAGNOSIS — N1832 Chronic kidney disease, stage 3b: Secondary | ICD-10-CM | POA: Diagnosis not present

## 2021-04-07 DIAGNOSIS — D508 Other iron deficiency anemias: Secondary | ICD-10-CM | POA: Diagnosis not present

## 2021-04-07 DIAGNOSIS — I5022 Chronic systolic (congestive) heart failure: Secondary | ICD-10-CM | POA: Diagnosis not present

## 2021-04-07 DIAGNOSIS — Z951 Presence of aortocoronary bypass graft: Secondary | ICD-10-CM | POA: Diagnosis not present

## 2021-04-07 DIAGNOSIS — Z86010 Personal history of colonic polyps: Secondary | ICD-10-CM | POA: Diagnosis not present

## 2021-04-07 DIAGNOSIS — M869 Osteomyelitis, unspecified: Secondary | ICD-10-CM | POA: Diagnosis not present

## 2021-04-07 DIAGNOSIS — I2583 Coronary atherosclerosis due to lipid rich plaque: Secondary | ICD-10-CM

## 2021-04-07 DIAGNOSIS — I10 Essential (primary) hypertension: Secondary | ICD-10-CM | POA: Diagnosis not present

## 2021-04-07 DIAGNOSIS — I2510 Atherosclerotic heart disease of native coronary artery without angina pectoris: Secondary | ICD-10-CM | POA: Diagnosis not present

## 2021-04-07 DIAGNOSIS — S025XXA Fracture of tooth (traumatic), initial encounter for closed fracture: Secondary | ICD-10-CM

## 2021-04-07 DIAGNOSIS — B20 Human immunodeficiency virus [HIV] disease: Secondary | ICD-10-CM | POA: Diagnosis not present

## 2021-04-07 HISTORY — DX: Anemia, unspecified: D64.9

## 2021-04-07 HISTORY — DX: Dizziness and giddiness: R42

## 2021-04-07 MED ORDER — BIKTARVY 50-200-25 MG PO TABS: 1.0000 | ORAL_TABLET | Freq: Every day | ORAL | 11 refills | Status: DC

## 2021-04-07 NOTE — Progress Notes (Signed)
Subjective:   Chief complaint: Concern about stool turning darker color and also about drop in his hemoglobin  Patient ID: Dale Mcdonald, male    DOB: 28-Feb-1951, 70 y.o.   MRN: [id number]  HPI  70 year old male who is doing superbly well on his antiviral regimen of Atripla to which he had been changed from his prior regimen of Reyataz Norvir Truvada which he had received through [facility] clinical trials [facility] 5257 to Atripla.  His been enrolled in HALO study in the [facility].  We tried to change him to [place] but his insurance would not cover this.    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  Infection is subsequent resolved  Did sustain a acute kidney injury and has had some chronic kidney disease since then.  Switch him over to [facility] which he has been on.  In cilazapril he had major surgery on his mouth to operate on several teeth.  Seems of lost a fair amount of blood and hemoglobin is gone down in the interim.  He is also had some dark stools and has let PCP know about this.  Takes ibuprofen at 600 mg a few times a day week for aches and pains but no other nonsteroidals.  He does not drink alcohol regularly.  He had a colonoscopy in March with a polyp found.  He also has had symptoms of orthostasis that is been going on for several years since he has been on blood pressure medicines he says a few hours after taking his blood pressure medicines he will get dizzy when he stands suddenly.      Past Medical History:  Diagnosis Date   Anxiety 03/29/2017   Cirrhosis ([place])    Heart murmur    Hepatitis    B   HIV (human immunodeficiency virus infection) ([place])    Hx of adenomatous polyp of colon 02/08/2018   Hyperglycemia 04/17/2020    Hyperlipidemia    Hypertension    Palpitations     Past Surgical History:  Procedure Laterality Date   CATARACT EXTRACTION W/PHACO Left 11/11/2017   Procedure: CATARACT EXTRACTION PHACO AND INTRAOCULAR LENS PLACEMENT ([place]);  Surgeon: Birder Robson, MD;  Location: [facility] ORS;  Service: Ophthalmology;  Laterality: Left;  [place] 00:59.3 AP% 19.5 CDE 11.54 Fluid pack lot # [id number]   CATARACT EXTRACTION W/PHACO Right 12/21/2017   Procedure: CATARACT EXTRACTION PHACO AND INTRAOCULAR LENS PLACEMENT (IOC);  Surgeon: Birder Robson, MD;  Location: [facility] ORS;  Service: Ophthalmology;  Laterality: Right;  [place] 00:40 AP% 13.5 CDE 5.50 Fluid pack lot $ [id number]   COLONOSCOPY     COLONOSCOPY WITH PROPOFOL N/A 02/08/2018   Procedure: COLONOSCOPY WITH PROPOFOL;  Surgeon: Jonathon Bellows, MD;  Location: [facility] ENDOSCOPY;  Service: Gastroenterology;  Laterality: N/A;   CORONARY ARTERY BYPASS GRAFT  04/07/2018   EYE SURGERY     RETINA   HERNIA REPAIR     MITRAL VALVE REPLACEMENT  03/2018   bovine--with CABG    No family history on file.    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  Tobacco Use   Smoking status: Former    Packs/day: 2.00    Years: 40.00    Pack years: 80.00    Types: Cigarettes, E-cigarettes   Smokeless tobacco: Never   Tobacco comments:    Quit 05/25/2017/ cutting back  Substance and Sexual Activity   Alcohol use: No    Alcohol/week: 0.0 standard drinks   Drug use: No   Sexual activity: Not on file  Other Topics Concern   Not on file  Social History Narrative   Retired and widowed   1 son   Former smoker   3 caffeinated beverages a day   No alcohol no drug use no smokeless tobacco      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, Disp: 90 tablet, Rfl: 3   aspirin EC 81 MG tablet, Take 81 mg by mouth daily., Disp: , Rfl:    atorvastatin (LIPITOR) 20 MG tablet, TAKE 1 TABLET BY MOUTH  DAILY, Disp: 90 tablet, Rfl: 3   bictegravir-emtricitabine-tenofovir AF (BIKTARVY) 50-200-25 MG TABS tablet, Take 1 tablet by mouth daily., Disp: 30 tablet, Rfl: 11   carvedilol (COREG) 3.125 MG tablet, Take 3.125 mg by mouth 2 (two) times daily., Disp: , Rfl:    colchicine 0.6 MG tablet, Take 1 tablet (0.6 mg total) by mouth 2 (two) times daily as needed., Disp: 180 tablet, Rfl: 3   ENTRESTO 49-51 MG, Take 1 tablet by mouth 2 (two) times daily., Disp: , Rfl:    fluconazole (DIFLUCAN) 150 MG tablet, Take 150 mg by mouth once a week. prn, Disp: , Rfl:    Melatonin 5 MG TABS, Take 5 mg by mouth at bedtime. , Disp: , Rfl:    Multiple Vitamin (MULTI-VITAMIN) tablet, Take by mouth., Disp: , Rfl:    pantoprazole (PROTONIX) 40 MG tablet, Take 1 tablet (40 mg total) by mouth daily., Disp: 90 tablet, Rfl: 3   spironolactone (ALDACTONE) 25 MG tablet, Take 25 mg by mouth daily., Disp: , Rfl:    torsemide (DEMADEX) 20 MG tablet, 1-2 tabs daily as needed, Disp: , Rfl:    traZODone (DESYREL) 50 MG tablet, TAKE 1-2 TABLETS (50-100 MG TOTAL) BY MOUTH AT BEDTIME., Disp: 180 tablet,  Rfl: 3   valACYclovir (VALTREX) 500 MG tablet, Take 1 tablet (500 mg total) by mouth daily as needed (for fever blisters.)., Disp: 30 tablet, Rfl: 5     Review of Systems  Constitutional:  Negative for activity change, appetite change, chills, diaphoresis, fatigue, fever and unexpected weight change.  HENT:  Negative for congestion, rhinorrhea, sinus pressure, sneezing, sore throat and trouble swallowing.   Eyes:  Negative for photophobia and visual disturbance.  Respiratory:  Negative for cough, chest tightness, shortness of breath, wheezing and stridor.   Cardiovascular:  Negative for chest pain, palpitations and leg swelling.  Gastrointestinal:  Negative for abdominal distention, abdominal pain, anal bleeding, blood in stool, constipation, diarrhea, nausea and vomiting.  Genitourinary:  Negative for difficulty urinating, dysuria, flank pain and hematuria.  Musculoskeletal:  Negative for arthralgias, back pain, gait problem, joint swelling and myalgias.  Skin:  Negative for color change, pallor, rash and wound.  Neurological:  Positive for dizziness. Negative for tremors, weakness and light-headedness.  Hematological:  Negative for adenopathy. Does not bruise/bleed easily.  Psychiatric/Behavioral:  Negative for agitation, behavioral problems, confusion, decreased concentration, dysphoric mood and sleep disturbance.       Objective:   Physical Exam Constitutional:      Appearance: He is well-developed.  HENT:     Head: Normocephalic and atraumatic.  Eyes:     Conjunctiva/sclera: Conjunctivae normal.  Cardiovascular:     Rate and Rhythm: Normal rate and regular rhythm.  Pulmonary:     Effort: Pulmonary effort is normal. No respiratory distress.     Breath sounds: No wheezing.  Abdominal:     General: There is no distension.     Palpations: Abdomen is soft.  Musculoskeletal:        General: No tenderness. Normal range of motion.     Cervical back: Normal range of motion and neck  supple.  Skin:    General: Skin is warm and dry.     Coloration: Skin is not pale.     Findings: No erythema or rash.  Neurological:     General: No focal deficit present.     Mental Status: He is alert and oriented to person, place, and time.  Psychiatric:        Mood and Affect: Mood normal.        Behavior: Behavior normal.        Thought Content: Thought content normal.        Judgment: Judgment normal.          Assessment & Plan:   HIV disease:  I reviewed his most recent viral load which was not detected  Lab Results  Component Value Date   HIV1RNAQUANT Not Detected 03/24/2021     I reviewed his most recent CD4 count which was 667  Lab Results  Component Value Date   CD4TABS 667 03/24/2021   CD4TABS 728 04/02/2020   CD4TABS 646 03/31/2019    I am continue his BIKTARVY prescription.  Chronic hepatitis B without hepatic coma without delta agent: He was still hepatitis B surface antigen last checked hep B DNA was not detected.  Is scheduled for right upper quadrant ultrasound this January which we will screen for hepatocellular carcinoma in addition to evaluating his NASH.  NASH: He is continue to try to lose weight which has been quite challenging.  Orthostatic changes: Asked him to review this with PCP and perhaps needs to have reduction in his antihypertensive medications.  Coronary disease, status post coronary artery bypass grafting: Continue on atorvastatin carvedilol.  Heart failure: Also on torsemide Entresto and spironolactone.   Counseling recommended to get updated booster for COVID-19 which he received today.

## 2021-04-09 ENCOUNTER — Other Ambulatory Visit (INDEPENDENT_AMBULATORY_CARE_PROVIDER_SITE_OTHER): Payer: Medicare Other

## 2021-04-09 ENCOUNTER — Telehealth: Payer: Self-pay | Admitting: *Deleted

## 2021-04-09 DIAGNOSIS — D649 Anemia, unspecified: Secondary | ICD-10-CM

## 2021-04-09 LAB — FECAL OCCULT BLOOD, IMMUNOCHEMICAL: Fecal Occult Bld: POSITIVE — AB

## 2021-04-09 NOTE — Telephone Encounter (Signed)
Left message on VM to be looking out for a call from Dr Georgeann Oppenheim office.

## 2021-04-09 NOTE — Telephone Encounter (Signed)
CRITICAL VALUE STICKER  CRITICAL VALUE: + IFOB  RECEIVER (on-site recipient of call): Jari Favre, CMA  DATE & TIME NOTIFIED: 04/09/21 @ 10:20am  MESSENGER (representative from lab): N/A  MD NOTIFIED: Dr. Silvio Pate  TIME OF NOTIFICATION: 10:56am  RESPONSE:  see result

## 2021-04-10 IMAGING — US US ABDOMEN COMPLETE W/ ELASTOGRAPHY
1 series · 12 of 25 positions shown · non-contrast
Comparison: None.

CLINICAL DATA: Cirrhosis.

EXAM:
ULTRASOUND ABDOMEN
ULTRASOUND HEPATIC ELASTOGRAPHY
TECHNIQUE: Sonography of the upper abdomen was performed. In addition,
ultrasound elastography evaluation of the liver was performed. A
region of interest was placed within the right lobe of the liver.
Following application of a compressive sonographic pulse, tissue
compressibility was assessed. Multiple assessments were performed at
the selected site. Median tissue compressibility was determined.
Previously, hepatic stiffness was assessed by shear wave velocity.
Based on recently published Society of Radiologists in Ultrasound
consensus article, reporting is now recommended to be performed in
the SI units of pressure (kiloPascals) representing hepatic
stiffness/elasticity. The obtained result is compared to the
published reference standards. (cACLD = compensated Advanced Chronic
Liver Disease)

[Series 1: us abdomen complete w/elastography · 12 of 79 slices shown]
[im 4/79]
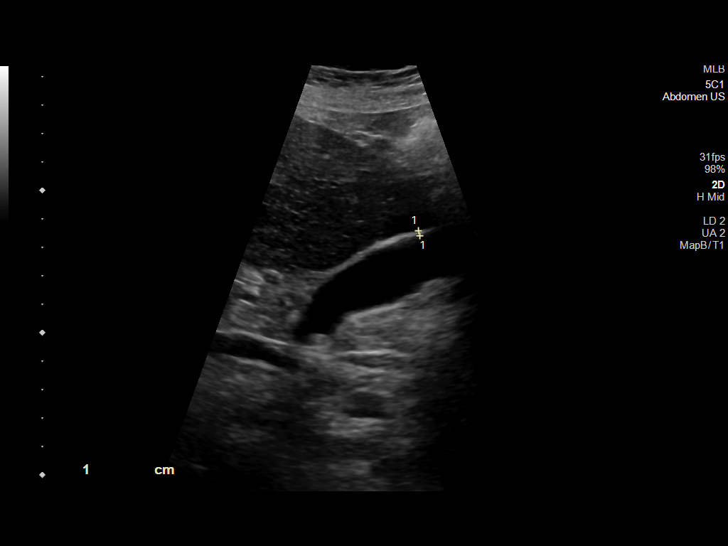
[im 10/79]
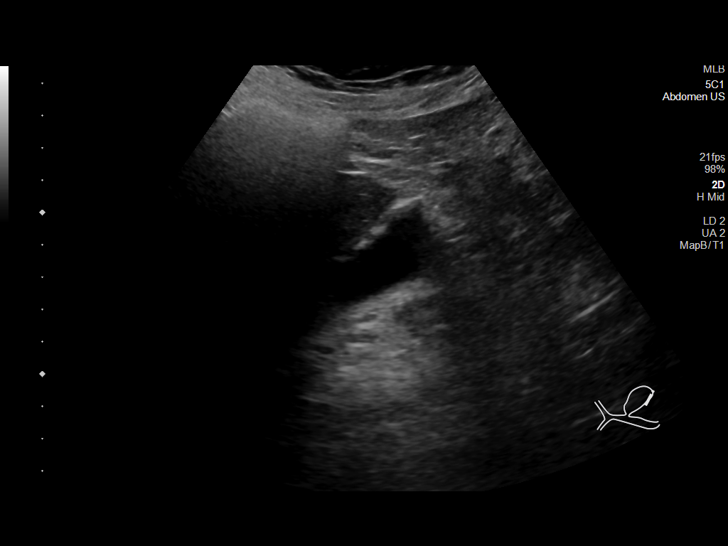
[im 17/79]
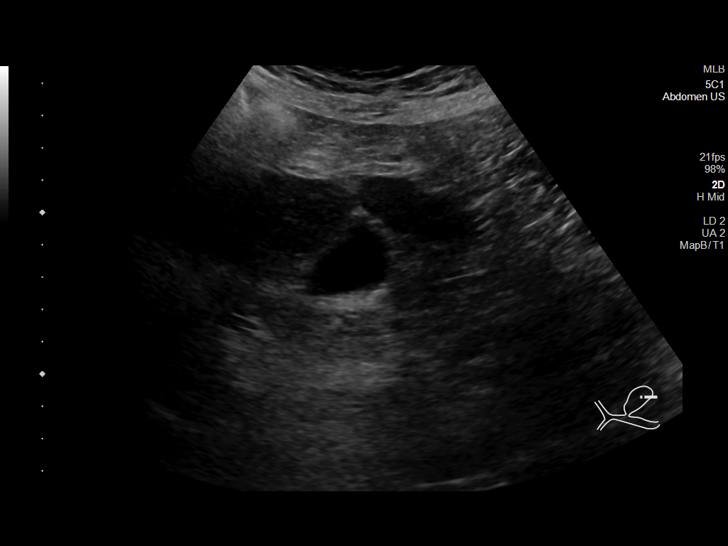
[im 23/79]
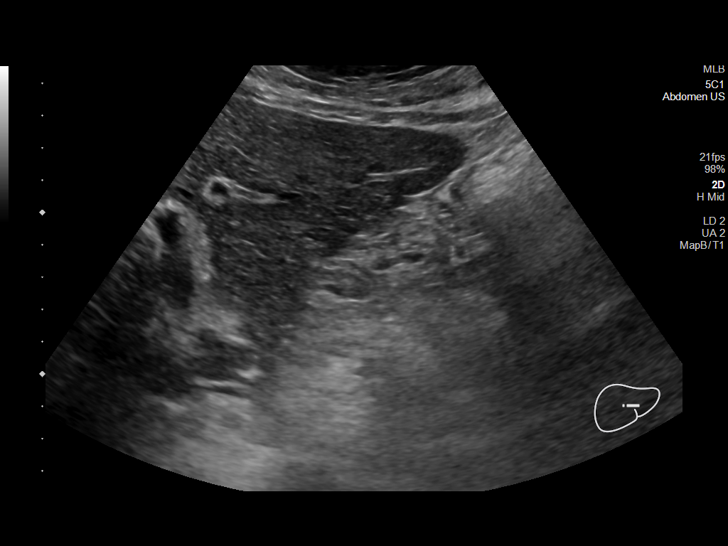
[im 30/79]
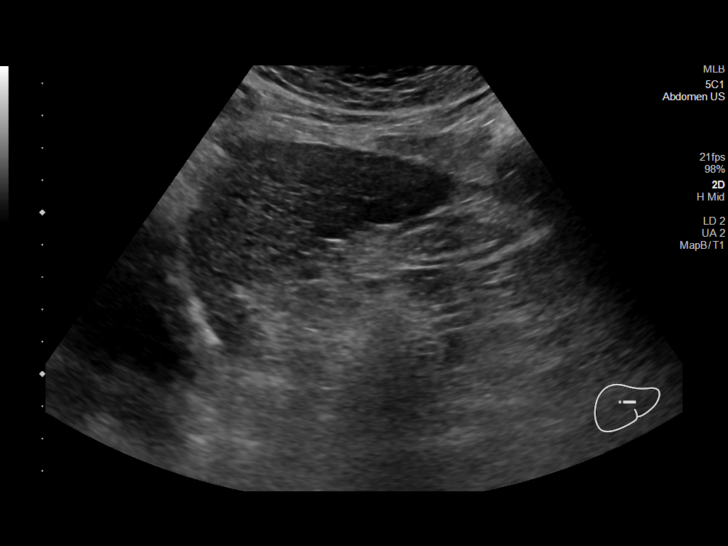
[im 36/79]
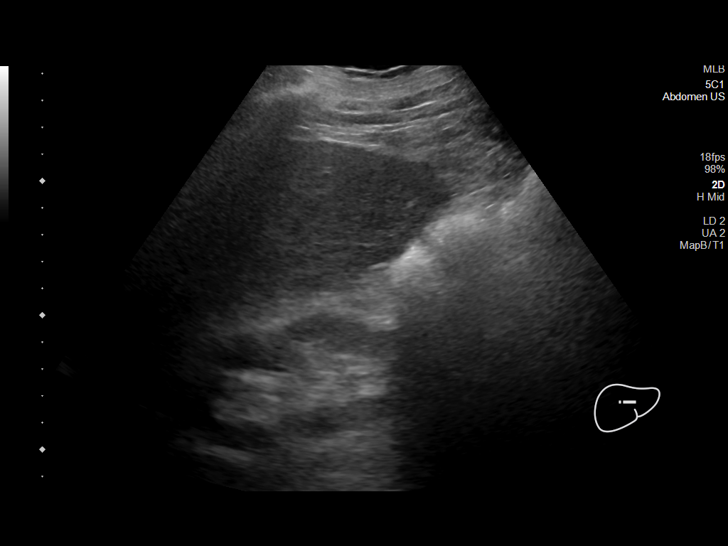
[im 43/79]
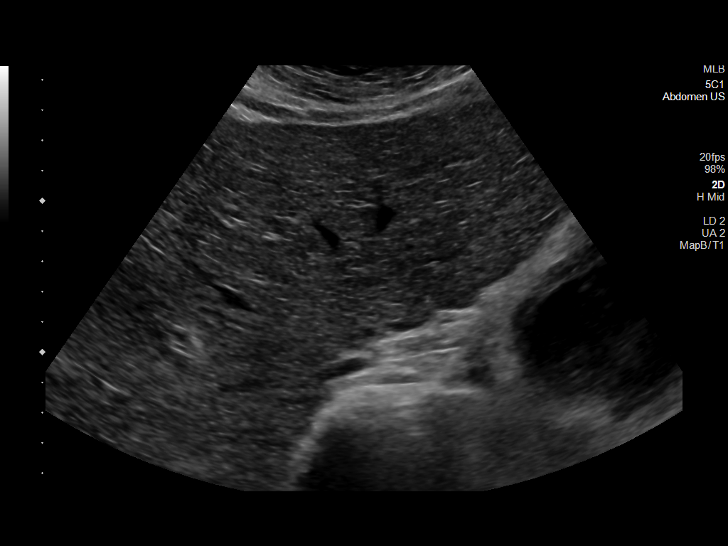
[im 49/79]
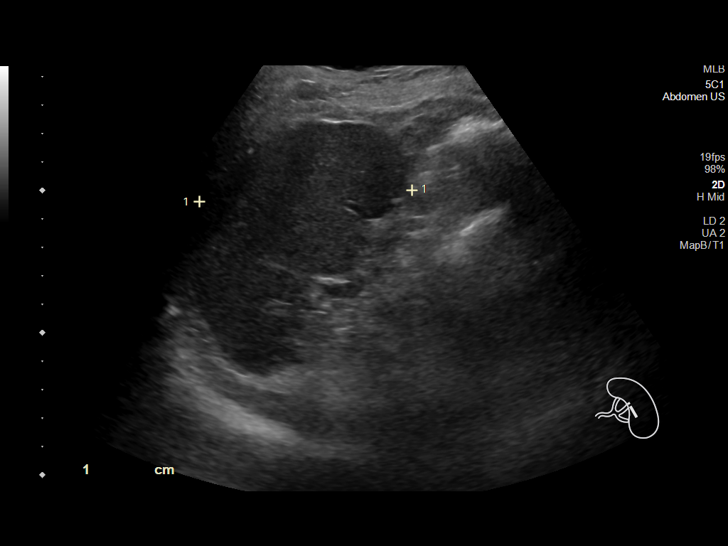
[im 56/79]
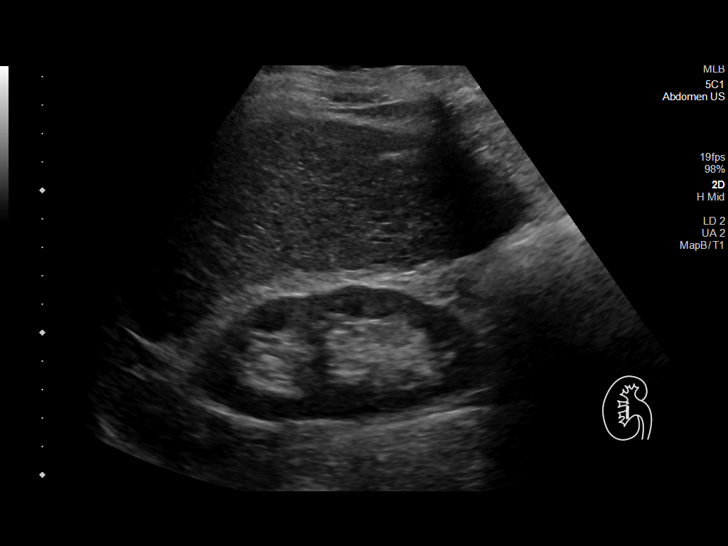
[im 62/79]
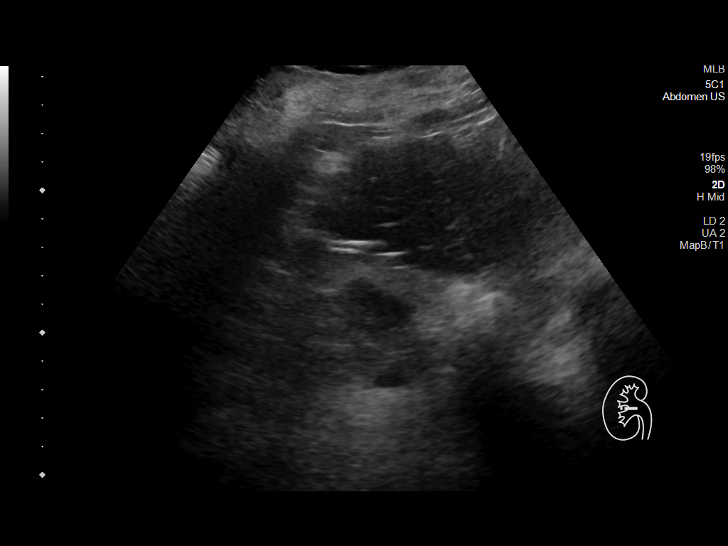
[im 69/79]
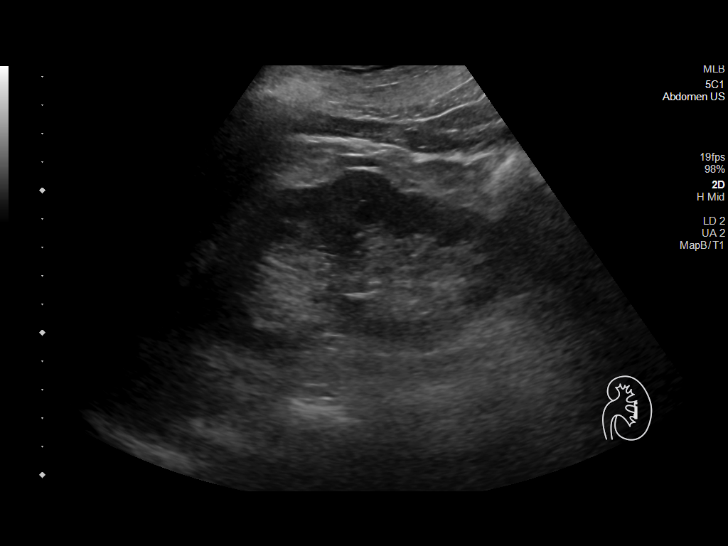
[im 75/79]
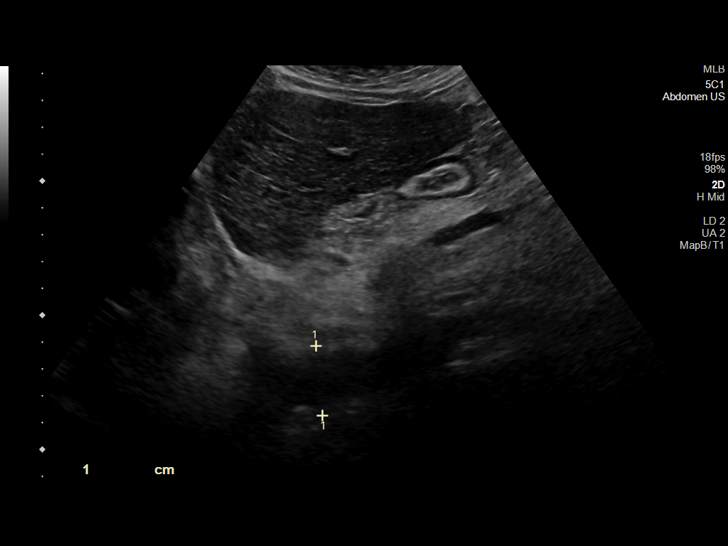

[12 of 25 positions shown; findings below may reference images not displayed]

FINDINGS: ULTRASOUND ABDOMEN

Gallbladder: No gallstones or wall thickening visualized. No
sonographic Murphy sign noted by sonographer.

Common bile duct: Diameter: 4.4 mm

Liver: The liver has a coarsened echotexture compatible with the
clinical history of cirrhosis. The contour the liver is slightly
nodular. There is no focal liver abnormality. Portal vein is patent
on color Doppler imaging with normal direction of blood flow towards
the liver.

IVC: No abnormality visualized.

Pancreas: Visualized portion unremarkable.

Spleen: Size and appearance within normal limits.

Right Kidney: Length: 10.7 cm. Echogenicity within normal limits. No
mass or hydronephrosis visualized.

Left Kidney: Length: 12.5 cm. Echogenicity within normal limits. No
mass or hydronephrosis visualized.

Abdominal aorta: No aneurysm visualized.

Other findings: None.

ULTRASOUND HEPATIC ELASTOGRAPHY

Device: Siemens Helix VTQ

Patient position: Supine

Transducer 5 C1

Number of measurements: 10

Hepatic segment:  8

Median kPa:

IQR:

IQR/Median kPa ratio:

Data quality:  Good

Diagnostic category:  < or = 5 kPa: high probability of being normal

The use of hepatic elastography is applicable to patients with viral
hepatitis and non-alcoholic fatty liver disease. At this time, there
is insufficient data for the referenced cut-off values and use in
other causes of liver disease, including alcoholic liver disease.
Patients, however, may be assessed by elastography and serve as
their own reference standard/baseline.

In patients with non-alcoholic liver disease, the values suggesting
compensated advanced chronic liver disease (cACLD) may be lower, and
patients may need additional testing with elasticity results of [DATE]
kPa.

Please note that abnormal hepatic elasticity and shear wave
velocities may also be identified in clinical settings other than
with hepatic fibrosis, such as: acute hepatitis, elevated right
heart and central venous pressures including use of beta blockers,
Ronnie disease ([place]), infiltrative processes such as
mastocytosis/amyloidosis/infiltrative tumor/lymphoma, extrahepatic
cholestasis, with hyperemia in the post-prandial state, and with
liver transplantation. Correlation with patient history, laboratory
data, and clinical condition recommended.

Diagnostic Categories:

< or =5 kPa: high probability of being normal

< or =9 kPa: in the absence of other known clinical signs, rules [DATE] kPa and ?13 kPa: suggestive of cACLD, but needs further testing

>13 kPa: highly suggestive of cACLD

> or =17 kPa: highly suggestive of cACLD with an increased
probability of clinically significant portal hypertension
IMPRESSION: ULTRASOUND ABDOMEN:

1. The liver has a heterogeneous echotexture with a nodular contour
compatible with the clinical history of cirrhosis. No focal liver
abnormality identified.

ULTRASOUND HEPATIC ELASTOGRAPHY:

Median kPa:

Diagnostic category:  < or = 5 kPa: high probability of being normal

## 2021-04-11 ENCOUNTER — Other Ambulatory Visit: Payer: Self-pay | Admitting: Infectious Disease

## 2021-04-11 DIAGNOSIS — R739 Hyperglycemia, unspecified: Secondary | ICD-10-CM

## 2021-04-11 DIAGNOSIS — Z951 Presence of aortocoronary bypass graft: Secondary | ICD-10-CM

## 2021-04-11 DIAGNOSIS — B20 Human immunodeficiency virus [HIV] disease: Secondary | ICD-10-CM

## 2021-04-11 DIAGNOSIS — I10 Essential (primary) hypertension: Secondary | ICD-10-CM

## 2021-04-11 DIAGNOSIS — I2583 Coronary atherosclerosis due to lipid rich plaque: Secondary | ICD-10-CM

## 2021-04-11 DIAGNOSIS — I2510 Atherosclerotic heart disease of native coronary artery without angina pectoris: Secondary | ICD-10-CM

## 2021-04-11 DIAGNOSIS — N1832 Chronic kidney disease, stage 3b: Secondary | ICD-10-CM

## 2021-04-11 DIAGNOSIS — I5022 Chronic systolic (congestive) heart failure: Secondary | ICD-10-CM

## 2021-04-11 DIAGNOSIS — M1000 Idiopathic gout, unspecified site: Secondary | ICD-10-CM

## 2021-04-11 DIAGNOSIS — I517 Cardiomegaly: Secondary | ICD-10-CM

## 2021-04-11 DIAGNOSIS — B181 Chronic viral hepatitis B without delta-agent: Secondary | ICD-10-CM

## 2021-04-16 ENCOUNTER — Encounter: Payer: Self-pay | Admitting: Internal Medicine

## 2021-04-21 ENCOUNTER — Other Ambulatory Visit: Payer: Self-pay

## 2021-04-21 DIAGNOSIS — R195 Other fecal abnormalities: Secondary | ICD-10-CM

## 2021-04-21 MED ORDER — PEG 3350-KCL-NA BICARB-NACL 420 G PO SOLR: ORAL | 0 refills | Status: DC

## 2021-04-21 NOTE — Addendum Note (Signed)
Addended by: Wayna Chalet on: 04/21/2021 04:40 PM   Modules accepted: Orders

## 2021-04-27 ENCOUNTER — Other Ambulatory Visit: Payer: Self-pay | Admitting: Internal Medicine

## 2021-05-02 ENCOUNTER — Encounter: Payer: Self-pay | Admitting: Internal Medicine

## 2021-05-02 ENCOUNTER — Other Ambulatory Visit: Payer: Self-pay

## 2021-05-02 ENCOUNTER — Ambulatory Visit (INDEPENDENT_AMBULATORY_CARE_PROVIDER_SITE_OTHER): Payer: Medicare Other | Admitting: Internal Medicine

## 2021-05-02 VITALS — BP 112/60 | HR 73 | Temp 98.3°F | Ht 70.5 in | Wt 195.0 lb

## 2021-05-02 DIAGNOSIS — D649 Anemia, unspecified: Secondary | ICD-10-CM | POA: Diagnosis not present

## 2021-05-02 LAB — CBC
HCT: 39.2 % (ref 39.0–52.0)
Hemoglobin: 13.2 g/dL (ref 13.0–17.0)
MCHC: 33.7 g/dL (ref 30.0–36.0)
MCV: 99.2 fl (ref 78.0–100.0)
Platelets: 182 10*3/uL (ref 150.0–400.0)
RBC: 3.96 Mil/uL — ABNORMAL LOW (ref 4.22–5.81)
RDW: 14 % (ref 11.5–15.5)
WBC: 6.3 10*3/uL (ref 4.0–10.5)

## 2021-05-02 NOTE — Assessment & Plan Note (Signed)
He is concerned about this Tried iron now Wants to know if it is back to normal Will be getting the colonoscopy (just can't do the 12/14)

## 2021-05-02 NOTE — Progress Notes (Signed)
Subjective:    Patient ID: Dale Mcdonald, male    DOB: August 06, 1950, 70 y.o.   MRN: [id number]  HPI Here to review his positive FIT testing and plans  He had mild anemia and then a positive FIT Was schedule 02/63---[facility] he has a conflict He wasn't able to reach them to reschedule  Current Outpatient Medications on File Prior to Visit  Medication Sig Dispense Refill   allopurinol (ZYLOPRIM) 300 MG tablet TAKE 1 TABLET BY MOUTH EVERY DAY (Patient taking differently: daily as needed.) 90 tablet 3   atorvastatin (LIPITOR) 20 MG tablet TAKE 1 TABLET BY MOUTH  DAILY 90 tablet 3   bictegravir-emtricitabine-tenofovir AF (BIKTARVY) 50-200-25 MG TABS tablet Take 1 tablet by mouth daily. 30 tablet 11   carvedilol (COREG) 3.125 MG tablet Take 3.125 mg by mouth 2 (two) times daily.     colchicine 0.6 MG tablet Take 1 tablet (0.6 mg total) by mouth 2 (two) times daily as needed. 180 tablet 3   Cyanocobalamin (B-12 PO) Take by mouth.     ENTRESTO 49-51 MG Take 1 tablet by mouth 2 (two) times daily.     Ferrous Sulfate (IRON PO) Take by mouth.     fluconazole (DIFLUCAN) 150 MG tablet Take 150 mg by mouth once a week. prn     Melatonin 5 MG TABS Take 5 mg by mouth at bedtime.      Multiple Vitamin (MULTI-VITAMIN) tablet Take by mouth.     pantoprazole (PROTONIX) 40 MG tablet TAKE 1 TABLET BY MOUTH  DAILY 90 tablet 3   polyethylene glycol-electrolytes (NULYTELY) 420 g solution Prepare according to package instructions. Starting at 5:00 PM: Drink one 8 oz glass of mixture every 15 minutes until you finish half of the jug. Five hours prior to procedure, drink 8 oz glass of mixture every 15 minutes until it is all gone. Make sure you do not drink anything 4 hours prior to your procedure. 4000 mL 0   sacubitril-valsartan (ENTRESTO) 97-103 MG Take 1 tablet by mouth every 12 (twelve) hours.     spironolactone (ALDACTONE) 25 MG tablet Take 25 mg by mouth daily.     torsemide (DEMADEX) 20 MG tablet 1-2 tabs daily  as needed     traZODone (DESYREL) 50 MG tablet TAKE 1-2 TABLETS (50-100 MG TOTAL) BY MOUTH AT BEDTIME. 180 tablet 3   valACYclovir (VALTREX) 500 MG tablet Take 1 tablet (500 mg total) by mouth daily as needed (for fever blisters.). 30 tablet 5   KLOR-CON M20 20 MEQ tablet Take 20 mEq by mouth daily as needed.     No current facility-administered medications on file prior to visit.    Allergies  Allergen Reactions   Magnesium Nausea Only    Past Medical History:  Diagnosis Date   Anemia 04/07/2021   Anxiety 03/29/2017   Cirrhosis ([place])    Heart murmur    Hepatitis    B   HIV (human immunodeficiency virus infection) ([place])    Hx of adenomatous polyp of colon 02/08/2018   Hyperglycemia 04/17/2020   Hyperlipidemia    Hypertension    Orthostatic dizziness 04/07/2021   Palpitations     Past Surgical History:  Procedure Laterality Date   CATARACT EXTRACTION W/PHACO Left 11/11/2017   Procedure: CATARACT EXTRACTION PHACO AND INTRAOCULAR LENS PLACEMENT ([place]);  Surgeon: Birder Robson, MD;  Location: [facility] ORS;  Service: Ophthalmology;  Laterality: Left;  [place] 00:59.3 AP% 19.5 CDE 11.54 Fluid pack lot # [id number]  CATARACT EXTRACTION W/PHACO Right 12/21/2017   Procedure: CATARACT EXTRACTION PHACO AND INTRAOCULAR LENS PLACEMENT (IOC);  Surgeon: Birder Robson, MD;  Location: [facility] ORS;  Service: Ophthalmology;  Laterality: Right;  [place] 00:40 AP% 13.5 CDE 5.50 Fluid pack lot $ [id number]   COLONOSCOPY     COLONOSCOPY WITH PROPOFOL N/A 02/08/2018   Procedure: COLONOSCOPY WITH PROPOFOL;  Surgeon: Jonathon Bellows, MD;  Location: [facility] ENDOSCOPY;  Service: Gastroenterology;  Laterality: N/A;   CORONARY ARTERY BYPASS GRAFT  04/07/2018   EYE SURGERY     RETINA   HERNIA REPAIR     MITRAL VALVE REPLACEMENT  03/2018   bovine--with CABG    History reviewed. No pertinent family history.  Social History   Socioeconomic History   Marital status: Widowed    Spouse name: Not on file   Number of  children: 1   Years of education: Not on file   Highest education level: Not on file  Occupational History   Occupation: VICE PRESIDENT--Purchasing---retired    Comment: [employer]  Tobacco Use   Smoking status: Former    Packs/day: 2.00    Years: 40.00    Pack years: 80.00    Types: Cigarettes, E-cigarettes   Smokeless tobacco: Never   Tobacco comments:    Quit 05/25/2017/ cutting back  Substance and Sexual Activity   Alcohol use: No    Alcohol/week: 0.0 standard drinks   Drug use: No   Sexual activity: Not on file    Comment: declined condoms  Other Topics Concern   Not on file  Social History Narrative   Retired and widowed   1 son   Former smoker   3 caffeinated beverages a day   No alcohol no drug use no smokeless tobacco      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, 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 [employer]      No living will   Would want son [name] to be Media planner   Would accept resuscitation but no prolonged ventilation   Not sure about tube feeds--but definitely not long term   Social Determinants of Radio broadcast assistant Strain: Not on file  Food Insecurity: Not on file  Transportation Needs: Not on file  Physical Activity: Not on file  Stress: Not on file  Social Connections: Not on file  Intimate Partner Violence: Not on file   Review of Systems Now taking iron and B12 due to the anemia Now eating before his pills Tried OTC fecal test and was negative    Objective:   Physical Exam Constitutional:      Appearance: Normal appearance.  Neurological:     Mental Status: He is alert.  Psychiatric:        Mood and Affect: Mood normal.        Behavior: Behavior normal.           Assessment & Plan:

## 2021-05-06 ENCOUNTER — Telehealth: Payer: Self-pay | Admitting: Gastroenterology

## 2021-05-06 NOTE — Telephone Encounter (Signed)
LVM for pt to return my call tor reschedule procedure.

## 2021-05-06 NOTE — Telephone Encounter (Signed)
Inbound call from pt requesting a call back to cxl his procedure for tomorrow and he needs to r/s as well. Thank you.

## 2021-05-07 ENCOUNTER — Encounter: Admission: RE | Payer: Self-pay | Source: Home / Self Care

## 2021-05-07 ENCOUNTER — Ambulatory Visit: Admission: RE | Admit: 2021-05-07 | Payer: Medicare Other | Source: Home / Self Care | Admitting: Gastroenterology

## 2021-05-07 ENCOUNTER — Other Ambulatory Visit: Payer: Self-pay

## 2021-05-07 DIAGNOSIS — R195 Other fecal abnormalities: Secondary | ICD-10-CM

## 2021-05-07 SURGERY — COLONOSCOPY WITH PROPOFOL
Anesthesia: General

## 2021-05-07 MED ORDER — CLENPIQ 10-3.5-12 MG-GM -GM/160ML PO SOLN: 320.0000 mL | ORAL | 0 refills | Status: DC

## 2021-05-07 NOTE — Telephone Encounter (Signed)
Pt returned my call and was rescheduled to 06/03/21.

## 2021-06-02 ENCOUNTER — Encounter: Payer: Self-pay | Admitting: Gastroenterology

## 2021-06-02 ENCOUNTER — Encounter: Payer: Self-pay | Admitting: Internal Medicine

## 2021-06-03 ENCOUNTER — Ambulatory Visit: Payer: Medicare Other | Admitting: Certified Registered Nurse Anesthetist

## 2021-06-03 ENCOUNTER — Encounter
Admission: RE | Disposition: A | Discharge status: Home / Self Care | Payer: Self-pay | Source: Home / Self Care | Attending: Gastroenterology

## 2021-06-03 ENCOUNTER — Other Ambulatory Visit: Payer: Self-pay

## 2021-06-03 ENCOUNTER — Encounter: Payer: Self-pay | Admitting: Gastroenterology

## 2021-06-03 ENCOUNTER — Ambulatory Visit
Admission: RE | Admit: 2021-06-03 | Discharge: 2021-06-03 | Disposition: A | Discharge status: Home / Self Care | Payer: Medicare Other | Attending: Gastroenterology | Admitting: Gastroenterology

## 2021-06-03 DIAGNOSIS — Z87891 Personal history of nicotine dependence: Secondary | ICD-10-CM | POA: Insufficient documentation

## 2021-06-03 DIAGNOSIS — K635 Polyp of colon: Secondary | ICD-10-CM

## 2021-06-03 DIAGNOSIS — Z21 Asymptomatic human immunodeficiency virus [HIV] infection status: Secondary | ICD-10-CM | POA: Insufficient documentation

## 2021-06-03 DIAGNOSIS — I509 Heart failure, unspecified: Secondary | ICD-10-CM | POA: Diagnosis not present

## 2021-06-03 DIAGNOSIS — B1910 Unspecified viral hepatitis B without hepatic coma: Secondary | ICD-10-CM | POA: Insufficient documentation

## 2021-06-03 DIAGNOSIS — Z09 Encounter for follow-up examination after completed treatment for conditions other than malignant neoplasm: Secondary | ICD-10-CM | POA: Insufficient documentation

## 2021-06-03 DIAGNOSIS — D125 Benign neoplasm of sigmoid colon: Secondary | ICD-10-CM | POA: Diagnosis not present

## 2021-06-03 DIAGNOSIS — K5790 Diverticulosis of intestine, part unspecified, without perforation or abscess without bleeding: Secondary | ICD-10-CM | POA: Diagnosis not present

## 2021-06-03 DIAGNOSIS — I2510 Atherosclerotic heart disease of native coronary artery without angina pectoris: Secondary | ICD-10-CM | POA: Insufficient documentation

## 2021-06-03 DIAGNOSIS — K5730 Diverticulosis of large intestine without perforation or abscess without bleeding: Secondary | ICD-10-CM | POA: Insufficient documentation

## 2021-06-03 DIAGNOSIS — Z86010 Personal history of colonic polyps: Secondary | ICD-10-CM

## 2021-06-03 DIAGNOSIS — N189 Chronic kidney disease, unspecified: Secondary | ICD-10-CM | POA: Diagnosis not present

## 2021-06-03 DIAGNOSIS — K648 Other hemorrhoids: Secondary | ICD-10-CM | POA: Diagnosis not present

## 2021-06-03 DIAGNOSIS — Z951 Presence of aortocoronary bypass graft: Secondary | ICD-10-CM | POA: Diagnosis not present

## 2021-06-03 DIAGNOSIS — R195 Other fecal abnormalities: Secondary | ICD-10-CM

## 2021-06-03 DIAGNOSIS — I130 Hypertensive heart and chronic kidney disease with heart failure and stage 1 through stage 4 chronic kidney disease, or unspecified chronic kidney disease: Secondary | ICD-10-CM | POA: Insufficient documentation

## 2021-06-03 DIAGNOSIS — Z1211 Encounter for screening for malignant neoplasm of colon: Secondary | ICD-10-CM | POA: Diagnosis not present

## 2021-06-03 DIAGNOSIS — K644 Residual hemorrhoidal skin tags: Secondary | ICD-10-CM | POA: Diagnosis not present

## 2021-06-03 HISTORY — PX: COLONOSCOPY WITH PROPOFOL: SHX5780

## 2021-06-03 SURGERY — COLONOSCOPY WITH PROPOFOL
Anesthesia: General

## 2021-06-03 MED ORDER — IPRATROPIUM-ALBUTEROL 0.5-2.5 (3) MG/3ML IN SOLN
3.0000 mL | Freq: Once | RESPIRATORY_TRACT | Status: AC
  Administered 2021-06-03: 3 mL via RESPIRATORY_TRACT

## 2021-06-03 MED ORDER — PHENYLEPHRINE HCL (PRESSORS) 10 MG/ML IV SOLN
INTRAVENOUS | Status: DC | PRN
  Administered 2021-06-03 (×2): 80 ug via INTRAVENOUS
  Administered 2021-06-03 (×2): 160 ug via INTRAVENOUS

## 2021-06-03 MED ORDER — LIDOCAINE HCL (CARDIAC) PF 100 MG/5ML IV SOSY
PREFILLED_SYRINGE | INTRAVENOUS | Status: DC | PRN
  Administered 2021-06-03: 50 mg via INTRAVENOUS

## 2021-06-03 MED ORDER — GLYCOPYRROLATE 0.2 MG/ML IJ SOLN
INTRAMUSCULAR | Status: AC
  Filled 2021-06-03: qty 1

## 2021-06-03 MED ORDER — LIDOCAINE HCL (PF) 2 % IJ SOLN
INTRAMUSCULAR | Status: AC
  Filled 2021-06-03: qty 5

## 2021-06-03 MED ORDER — PHENYLEPHRINE HCL-NACL 20-0.9 MG/250ML-% IV SOLN
INTRAVENOUS | Status: AC
  Filled 2021-06-03: qty 250

## 2021-06-03 MED ORDER — PROPOFOL 10 MG/ML IV BOLUS
INTRAVENOUS | Status: DC | PRN
Start: 2021-06-03 — End: 2021-06-03
  Administered 2021-06-03: 30 mg via INTRAVENOUS
  Administered 2021-06-03: 70 mg via INTRAVENOUS

## 2021-06-03 MED ORDER — SODIUM CHLORIDE 0.9 % IV SOLN: INTRAVENOUS | Status: DC

## 2021-06-03 MED ORDER — PHENYLEPHRINE HCL (PRESSORS) 10 MG/ML IV SOLN
INTRAVENOUS | Status: AC
  Filled 2021-06-03: qty 1

## 2021-06-03 MED ORDER — PROPOFOL 500 MG/50ML IV EMUL
INTRAVENOUS | Status: AC
  Filled 2021-06-03: qty 100

## 2021-06-03 MED ORDER — IPRATROPIUM-ALBUTEROL 0.5-2.5 (3) MG/3ML IN SOLN
RESPIRATORY_TRACT | Status: AC
  Filled 2021-06-03: qty 3

## 2021-06-03 MED ORDER — PROPOFOL 500 MG/50ML IV EMUL
INTRAVENOUS | Status: DC | PRN
  Administered 2021-06-03: 150 ug/kg/min via INTRAVENOUS

## 2021-06-03 NOTE — Transfer of Care (Signed)
Immediate Anesthesia Transfer of Care Note  Patient: Dale Mcdonald  Procedure(s) Performed: COLONOSCOPY WITH PROPOFOL  Patient Location: PACU  Anesthesia Type:General  Level of Consciousness: sedated  Airway & Oxygen Therapy: Patient Spontanous Breathing  Post-op Assessment: Report given to RN and Post -op Vital signs reviewed and stable  Post vital signs: Reviewed and stable  Last Vitals:  Vitals Value Taken Time  BP 93/57 06/03/21 0821  Temp 36.1 C 06/03/21 0817  Pulse 90 06/03/21 0821  Resp 20 06/03/21 0821  SpO2 95 % 06/03/21 0821  Vitals shown include unvalidated device data.  Last Pain:  Vitals:   06/03/21 0817  TempSrc: Tympanic  PainSc: Asleep         Complications: No notable events documented.

## 2021-06-03 NOTE — Anesthesia Postprocedure Evaluation (Signed)
Anesthesia Post Note  Patient: Dale Mcdonald  Procedure(s) Performed: COLONOSCOPY WITH PROPOFOL  Patient location during evaluation: Endoscopy Anesthesia Type: General Level of consciousness: awake and alert Pain management: pain level controlled Vital Signs Assessment: post-procedure vital signs reviewed and stable Respiratory status: spontaneous breathing, nonlabored ventilation, respiratory function stable and patient connected to nasal cannula oxygen Cardiovascular status: blood pressure returned to baseline and stable Postop Assessment: no apparent nausea or vomiting Anesthetic complications: no   No notable events documented.   Last Vitals:  Vitals:   06/03/21 0821 06/03/21 0827  BP: (!) 93/57 90/60  Pulse: 88 92  Resp: 18 (!) 21  Temp:    SpO2: 95% 94%    Last Pain:  Vitals:   06/03/21 0827  TempSrc:   PainSc: 0-No pain                 Arita Miss

## 2021-06-03 NOTE — Op Note (Signed)
[facility] Gastroenterology Patient Name: Dale Mcdonald Procedure Date: 06/03/2021 7:53 AM MRN: [id number] Account #: [id number] Date of Birth: 21-Mar-1951 Admit Type: Outpatient Age: 71 Room: [facility] ENDO ROOM 3 Gender: Male Note Status: Finalized Instrument Name: Park Meo [id number] Procedure:             Colonoscopy Indications:           Surveillance: Personal history of adenomatous polyps                         on last colonoscopy 3 years ago, Last colonoscopy:                         September 2019 Providers:             Lin Landsman MD, MD Referring MD:          [name] (Referring MD) Medicines:             General Anesthesia Complications:         No immediate complications. Estimated blood loss: None. Procedure:             Pre-Anesthesia Assessment:                        - Prior to the procedure, a History and Physical was                         performed, and patient medications and allergies were                         reviewed. The patient is competent. The risks and                         benefits of the procedure and the sedation options and                         risks were discussed with the patient. All questions                         were answered and informed consent was obtained.                         Patient identification and proposed procedure were                         verified by the physician, the nurse, the                         anesthesiologist, the anesthetist and the technician                         in the pre-procedure area in the procedure room in the                         endoscopy suite. Mental Status Examination: alert and                         oriented. Airway Examination: normal oropharyngeal  airway and neck mobility. Respiratory Examination:                         clear to auscultation. CV Examination: normal.                         Prophylactic Antibiotics: The patient  does not require                         prophylactic antibiotics. Prior Anticoagulants: The                         patient has taken no previous anticoagulant or                         antiplatelet agents. ASA Grade Assessment: III - A                         patient with severe systemic disease. After reviewing                         the risks and benefits, the patient was deemed in                         satisfactory condition to undergo the procedure. The                         anesthesia plan was to use general anesthesia.                         Immediately prior to administration of medications,                         the patient was re-assessed for adequacy to receive                         sedatives. The heart rate, respiratory rate, oxygen                         saturations, blood pressure, adequacy of pulmonary                         ventilation, and response to care were monitored                         throughout the procedure. The physical status of the                         patient was re-assessed after the procedure.                        After obtaining informed consent, the colonoscope was                         passed under direct vision. Throughout the procedure,                         the patient's blood pressure, pulse, and oxygen  saturations were monitored continuously. The                         Colonoscope was introduced through the anus and                         advanced to the the cecum, identified by appendiceal                         orifice and ileocecal valve. The colonoscopy was                         performed without difficulty. The patient tolerated                         the procedure well. The quality of the bowel                         preparation was evaluated using the BBPS [facility] Bowel                         Preparation Scale) with scores of: Right Colon = 3                         (entire mucosa seen  well with no residual staining,                         small fragments of stool or opaque liquid), Transverse                         Colon = 3 (entire mucosa seen well with no residual                         staining, small fragments of stool or opaque liquid)                         and Left Colon = 3 (entire mucosa seen well with no                         residual staining, small fragments of stool or opaque                         liquid). The total BBPS score equals 9. Findings:      Skin tags were found on perianal exam.      A 5 mm polyp was found in the sigmoid colon. The polyp was sessile. The       polyp was removed with a cold snare. Resection and retrieval were       complete.      Multiple diverticula were found in the recto-sigmoid colon and sigmoid       colon. There was no evidence of diverticular bleeding.      Non-bleeding external and internal hemorrhoids were found during       retroflexion. The hemorrhoids were small.      The terminal ileum appeared normal. Impression:            - Perianal skin tags found on perianal exam.                        -  One 5 mm polyp in the sigmoid colon, removed with a                         cold snare. Resected and retrieved.                        - Severe diverticulosis in the recto-sigmoid colon and                         in the sigmoid colon. There was no evidence of                         diverticular bleeding.                        - Non-bleeding external and internal hemorrhoids.                        - The examined portion of the ileum was normal. Recommendation:        - Discharge patient to home (with escort).                        - Resume previous diet today.                        - Continue present medications.                        - Await pathology results.                        - Repeat colonoscopy in 5 to 7 years for surveillance                         based on pathology results. Procedure Code(s):      --- Professional ---                        [phone], Colonoscopy, flexible; with removal of                         tumor(s), polyp(s), or other lesion(s) by snare                         technique Diagnosis Code(s):     --- Professional ---                        Z86.010, Personal history of colonic polyps                        K63.5, Polyp of colon                        K64.8, Other hemorrhoids                        K64.4, Residual hemorrhoidal skin tags                        K57.30, Diverticulosis of large intestine without  perforation or abscess without bleeding CPT copyright 2019 American Medical Association. All rights reserved. The codes documented in this report are preliminary and upon coder review may  be revised to meet current compliance requirements. Dr. Ulyess Mort Lin Landsman MD, MD 06/03/2021 8:15:32 AM This report has been signed electronically. Number of Addenda: 0 Note Initiated On: 06/03/2021 7:53 AM Scope Withdrawal Time: 0 hours 8 minutes 34 seconds  Total Procedure Duration: 0 hours 13 minutes 14 seconds  Estimated Blood Loss:  Estimated blood loss: none. Estimated blood loss: none.      [facility]

## 2021-06-03 NOTE — Anesthesia Procedure Notes (Signed)
Date/Time: 06/03/2021 7:56 AM Performed by: Johnna Acosta, CRNA Pre-anesthesia Checklist: Patient identified, Emergency Drugs available, Suction available, Patient being monitored and Timeout performed Patient Re-evaluated:Patient Re-evaluated prior to induction Oxygen Delivery Method: Nasal cannula Preoxygenation: Pre-oxygenation with 100% oxygen Induction Type: IV induction

## 2021-06-03 NOTE — H&P (Signed)
Cephas Darby, MD [address]  [place]  [place], [place] [address]  Main: [phone]  Fax: [phone] Pager: [phone]  Primary Care Physician:  [name], MD Primary Gastroenterologist:  Dr. Cephas Darby  Pre-Procedure History & Physical: HPI:  Dale Mcdonald is a 71 y.o. male is here for an colonoscopy.   Past Medical History:  Diagnosis Date   Anemia 04/07/2021   Anxiety 03/29/2017   Cirrhosis ([place])    Heart murmur    Hepatitis    B   HIV (human immunodeficiency virus infection) ([place])    Hx of adenomatous polyp of colon 02/08/2018   Hyperglycemia 04/17/2020   Hyperlipidemia    Hypertension    Orthostatic dizziness 04/07/2021   Palpitations     Past Surgical History:  Procedure Laterality Date   CATARACT EXTRACTION W/PHACO Left 11/11/2017   Procedure: CATARACT EXTRACTION PHACO AND INTRAOCULAR LENS PLACEMENT ([place]);  Surgeon: Birder Robson, MD;  Location: [facility] ORS;  Service: Ophthalmology;  Laterality: Left;  [place] 00:59.3 AP% 19.5 CDE 11.54 Fluid pack lot # [id number]   CATARACT EXTRACTION W/PHACO Right 12/21/2017   Procedure: CATARACT EXTRACTION PHACO AND INTRAOCULAR LENS PLACEMENT ([facility]);  Surgeon: Birder Robson, MD;  Location: [facility] ORS;  Service: Ophthalmology;  Laterality: Right;  [place] 00:40 AP% 13.5 CDE 5.50 Fluid pack lot $ [id number]   COLONOSCOPY     COLONOSCOPY WITH PROPOFOL N/A 02/08/2018   Procedure: COLONOSCOPY WITH PROPOFOL;  Surgeon: Jonathon Bellows, MD;  Location: [facility] ENDOSCOPY;  Service: Gastroenterology;  Laterality: N/A;   CORONARY ARTERY BYPASS GRAFT  04/07/2018   EYE SURGERY     RETINA   HERNIA REPAIR     MITRAL VALVE REPLACEMENT  03/2018   bovine--with CABG    Prior to Admission medications   Medication Sig Start Date End Date Taking? Authorizing Provider  atorvastatin (LIPITOR) 20 MG tablet TAKE 1 TABLET BY MOUTH  DAILY 11/26/20  Yes [name], MD  bictegravir-emtricitabine-tenofovir AF (BIKTARVY) 50-200-25 MG TABS  tablet Take 1 tablet by mouth daily. 04/07/21  Yes Tommy Medal, Lavell Islam, MD  carvedilol (COREG) 3.125 MG tablet Take 3.125 mg by mouth 2 (two) times daily. 10/17/19  Yes [provider]  Cyanocobalamin (B-12 PO) Take by mouth.   Yes [provider]  ENTRESTO 49-51 MG Take 1 tablet by mouth 2 (two) times daily. 09/23/18  Yes [provider]  Ferrous Sulfate (IRON PO) Take by mouth.   Yes [provider]  Melatonin 5 MG TABS Take 5 mg by mouth at bedtime.    Yes [provider]  Multiple Vitamin (MULTI-VITAMIN) tablet Take by mouth.   Yes [provider]  pantoprazole (PROTONIX) 40 MG tablet TAKE 1 TABLET BY MOUTH  DAILY 04/28/21  Yes [name], MD  spironolactone (ALDACTONE) 25 MG tablet Take 25 mg by mouth daily. 02/14/19  Yes [provider]  traZODone (DESYREL) 50 MG tablet TAKE 1-2 TABLETS (50-100 MG TOTAL) BY MOUTH AT BEDTIME. 04/07/21 04/07/22 Yes [name], MD  allopurinol (ZYLOPRIM) 300 MG tablet TAKE 1 TABLET BY MOUTH EVERY DAY Patient taking differently: daily as needed. 04/07/21   [name], MD  colchicine 0.6 MG tablet Take 1 tablet (0.6 mg total) by mouth 2 (two) times daily as needed. 02/27/20   [name], MD  fluconazole (DIFLUCAN) 150 MG tablet Take 150 mg by mouth once a week. prn 03/08/19   [provider]  KLOR-CON M20 20 MEQ tablet Take  20 mEq by mouth daily as needed. 04/10/21   [provider]  polyethylene glycol-electrolytes (NULYTELY) 420 g solution Prepare according to package instructions. Starting at 5:00 PM: Drink one 8 oz glass of mixture every 15 minutes until you finish half of the jug. Five hours prior to procedure, drink 8 oz glass of mixture every 15 minutes until it is all gone. Make sure you do not drink anything 4 hours prior to your procedure. 04/21/21   Virgel Manifold, MD  sacubitril-valsartan (ENTRESTO) 97-103 MG Take 1 tablet by mouth every 12  (twelve) hours. 02/10/21 02/10/22  [provider]  Sod Picosulfate-Mag Ox-Cit Acd (CLENPIQ) 10-3.5-12 MG-GM -GM/160ML SOLN Take 320 mLs by mouth as directed. 05/07/21   Lin Landsman, MD  torsemide (DEMADEX) 20 MG tablet 1-2 tabs daily as needed 11/12/18   [provider]  valACYclovir (VALTREX) 500 MG tablet Take 1 tablet (500 mg total) by mouth daily as needed (for fever blisters.). 11/02/18   Truman Hayward, MD    Allergies as of 05/07/2021 - Review Complete 05/02/2021  Allergen Reaction Noted   Magnesium Nausea Only 05/27/2018    History reviewed. No pertinent family history.  Social History   Socioeconomic History   Marital status: Widowed    Spouse name: Not on file   Number of children: 1   Years of education: Not on file   Highest education level: Not on file  Occupational History   Occupation: VICE PRESIDENT--Purchasing---retired    Comment: [employer]  Tobacco Use   Smoking status: Former    Packs/day: 2.00    Years: 40.00    Pack years: 80.00    Types: Cigarettes, E-cigarettes   Smokeless tobacco: Never   Tobacco comments:    Quit 05/25/2017/ cutting back  Substance and Sexual Activity   Alcohol use: No    Alcohol/week: 0.0 standard drinks   Drug use: No   Sexual activity: Not on file    Comment: declined condoms  Other Topics Concern   Not on file  Social History Narrative   Retired and widowed   1 son   Former smoker   3 caffeinated beverages a day   No alcohol no drug use no smokeless tobacco      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, 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 [employer]      No living will   Would want son [name] to be Media planner   Would accept resuscitation but no prolonged ventilation   Not sure about tube feeds--but definitely not long term   Social Determinants of Radio broadcast assistant Strain: Not on file  Food Insecurity: Not on file  Transportation  Needs: Not on file  Physical Activity: Not on file  Stress: Not on file  Social Connections: Not on file  Intimate Partner Violence: Not on file    Review of Systems: See HPI, otherwise negative ROS  Physical Exam: BP 121/68    Pulse 97    Temp (!) 97.2 F (36.2 C) (Temporal)    Resp 18    Ht 5\' 10"  (1.778 m)    Wt 85.3 kg    BMI 26.98 kg/m  General:   Alert,  pleasant and cooperative in NAD Head:  Normocephalic and atraumatic. Neck:  Supple; no masses or thyromegaly. Lungs:  Clear throughout to auscultation.    Heart:  Regular rate and rhythm. Abdomen:  Soft, nontender and nondistended. Normal  bowel sounds, without guarding, and without rebound.   Neurologic:  Alert and  oriented x4;  grossly normal neurologically.  Impression/Plan: Dale Mcdonald is here for an colonoscopy to be performed for h/o tubular adenoma  Risks, benefits, limitations, and alternatives regarding  colonoscopy have been reviewed with the patient.  Questions have been answered.  All parties agreeable.   Sherri Sear, MD  06/03/2021, 7:36 AM

## 2021-06-03 NOTE — Anesthesia Preprocedure Evaluation (Addendum)
Anesthesia Evaluation  Patient identified by MRN, date of birth, ID band Patient awake    Reviewed: Allergy & Precautions, NPO status , Patient's Chart, lab work & pertinent test results  History of Anesthesia Complications Negative for: history of anesthetic complications  Airway Mallampati: II  TM Distance: >3 FB Neck ROM: Full    Dental  (+) Partial Upper   Pulmonary neg sleep apnea, neg COPD, Recent URI , Residual Cough, Patient abstained from smoking.Not current smoker, former smoker,  Cold/URI 1 week ago, "getting over it". He has been taking nebulizers at home which have helped.     + decreased breath sounds      Cardiovascular Exercise Tolerance: Good METS: > 9 Mets hypertension, + CAD, + CABG and +CHF  (-) Past MI (-) dysrhythmias  Rhythm:Regular Rate:Normal - Systolic murmurs Based on cardiologist's note from 3 months ago, patient has been stable and doing well, with no changes to his medications and no recent heart failure hospitalizations.  TTE:  Left ventricle: The cavity size was moderately dilated. Wall  thickness was increased in a pattern of moderate LVH. Systolic  function was moderately to severely reduced. The estimated  ejection fraction was in the range of 30% to 35%. Diffuse  hypokinesis. Regional wall motion abnormalities cannot be  excluded. Doppler parameters are consistent with abnormal left  ventricular relaxation (grade 1 diastolic dysfunction). Doppler  parameters are consistent with high ventricular filling pressure.  - Mitral valve: Mildly thickened leaflets . There was moderate to  severe regurgitation. Further evaluation could be performed with  transesophageal echocardiogram, as clinically indicated.  - Left atrium: The atrium was mildly dilated.  - Right ventricle: The cavity size was normal. Wall thickness was  normal. Systolic function was mildly reduced.     Neuro/Psych PSYCHIATRIC DISORDERS Anxiety negative neurological ROS     GI/Hepatic neg GERD  ,(+)     (-) substance abuse  , Hepatitis -, B  Endo/Other  neg diabetes  Renal/GU CRFRenal diseasenegative Renal ROS     Musculoskeletal   Abdominal   Peds  Hematology  (+) HIV, Well controlled HIV   Anesthesia Other Findings Past Medical History: 04/07/2021: Anemia 03/29/2017: Anxiety No date: Cirrhosis ([place]) No date: Heart murmur No date: Hepatitis     Comment:  B No date: HIV (human immunodeficiency virus infection) ([place]) 02/08/2018: Hx of adenomatous polyp of colon 04/17/2020: Hyperglycemia No date: Hyperlipidemia No date: Hypertension 04/07/2021: Orthostatic dizziness No date: Palpitations  Reproductive/Obstetrics                           Anesthesia Physical Anesthesia Plan  ASA: 3  Anesthesia Plan: General   Post-op Pain Management: Minimal or no pain anticipated   Induction: Intravenous  PONV Risk Score and Plan: 2 and Propofol infusion, TIVA and Ondansetron  Airway Management Planned: Nasal Cannula  Additional Equipment: None  Intra-op Plan:   Post-operative Plan:   Informed Consent: I have reviewed the patients History and Physical, chart, labs and discussed the procedure including the risks, benefits and alternatives for the proposed anesthesia with the patient or authorized representative who has indicated his/her understanding and acceptance.     Dental advisory given  Plan Discussed with: CRNA and Surgeon  Anesthesia Plan Comments: (Discussed risks of proceeding with recent URI. Given that patient is one week out from the URI and mostly recovered, and that he has taken two days of colonoscopy prep, I advised it should be  reasonably safe to proceed (with administration of Duoneb preop), but that there is likely some increased respiratory risk. Discussed risks of anesthesia with patient, including possibility of difficulty  with spontaneous ventilation under anesthesia necessitating airway intervention, PONV, and rare risks such as cardiac or respiratory or neurological events, and allergic reactions. Discussed the role of CRNA in patient's perioperative care. Patient understands.)        Anesthesia Quick Evaluation

## 2021-06-04 ENCOUNTER — Encounter: Payer: Self-pay | Admitting: Gastroenterology

## 2021-06-04 LAB — SURGICAL PATHOLOGY

## 2021-06-09 ENCOUNTER — Other Ambulatory Visit: Payer: Self-pay

## 2021-06-09 ENCOUNTER — Ambulatory Visit
Admission: RE | Admit: 2021-06-09 | Discharge: 2021-06-09 | Disposition: A | Discharge status: Home / Self Care | Payer: Medicare Other | Source: Ambulatory Visit | Attending: Internal Medicine | Admitting: Internal Medicine

## 2021-06-09 DIAGNOSIS — K8020 Calculus of gallbladder without cholecystitis without obstruction: Secondary | ICD-10-CM | POA: Diagnosis not present

## 2021-06-09 DIAGNOSIS — K760 Fatty (change of) liver, not elsewhere classified: Secondary | ICD-10-CM

## 2021-06-26 ENCOUNTER — Telehealth: Payer: Self-pay | Admitting: Internal Medicine

## 2021-06-26 NOTE — Chronic Care Management (AMB) (Signed)
°  Chronic Care Management   Note  06/26/2021 Name: Dale Mcdonald MRN: [id number] DOB: 1951/03/04  Dale Mcdonald is a 71 y.o. year old male who is a primary care patient of [place], Theophilus Kinds, MD. I reached out to Thornton Dales by phone today in response to a referral sent by Dale Mcdonald PCP, [name], MD.   [name] was given information about Chronic Care Management services today including:  CCM service includes personalized support from designated clinical staff supervised by his physician, including individualized plan of care and coordination with other care providers 24/7 contact phone numbers for assistance for urgent and routine care needs. Service will only be billed when office clinical staff spend 20 minutes or more in a month to coordinate care. Only one practitioner may furnish and bill the service in a calendar month. The patient may stop CCM services at any time (effective at the end of the month) by phone call to the office staff.   Patient agreed to services and verbal consent obtained.   Follow up plan:   [place]ana Secretary/administrator

## 2021-07-07 ENCOUNTER — Other Ambulatory Visit (INDEPENDENT_AMBULATORY_CARE_PROVIDER_SITE_OTHER): Payer: Medicare Other

## 2021-07-07 ENCOUNTER — Encounter: Payer: Self-pay | Admitting: Internal Medicine

## 2021-07-07 ENCOUNTER — Ambulatory Visit: Payer: Medicare Other | Admitting: Internal Medicine

## 2021-07-07 VITALS — BP 102/52 | HR 60 | Ht 69.5 in | Wt 196.0 lb

## 2021-07-07 DIAGNOSIS — D649 Anemia, unspecified: Secondary | ICD-10-CM

## 2021-07-07 DIAGNOSIS — K760 Fatty (change of) liver, not elsewhere classified: Secondary | ICD-10-CM

## 2021-07-07 LAB — CBC WITH DIFFERENTIAL/PLATELET
Basophils Absolute: 0 10*3/uL (ref 0.0–0.1)
Basophils Relative: 0.7 % (ref 0.0–3.0)
Eosinophils Absolute: 0.1 10*3/uL (ref 0.0–0.7)
Eosinophils Relative: 1.8 % (ref 0.0–5.0)
HCT: 39.7 % (ref 39.0–52.0)
Hemoglobin: 13.4 g/dL (ref 13.0–17.0)
Lymphocytes Relative: 33.6 % (ref 12.0–46.0)
Lymphs Abs: 1.9 10*3/uL (ref 0.7–4.0)
MCHC: 33.9 g/dL (ref 30.0–36.0)
MCV: 98.4 fl (ref 78.0–100.0)
Monocytes Absolute: 0.6 10*3/uL (ref 0.1–1.0)
Monocytes Relative: 10.5 % (ref 3.0–12.0)
Neutro Abs: 3.1 10*3/uL (ref 1.4–7.7)
Neutrophils Relative %: 53.4 % (ref 43.0–77.0)
Platelets: 170 10*3/uL (ref 150.0–400.0)
RBC: 4.04 Mil/uL — ABNORMAL LOW (ref 4.22–5.81)
RDW: 13.5 % (ref 11.5–15.5)
WBC: 5.8 10*3/uL (ref 4.0–10.5)

## 2021-07-07 LAB — FERRITIN: Ferritin: 36.9 ng/mL (ref 22.0–322.0)

## 2021-07-07 LAB — VITAMIN B12: Vitamin B-12: 744 pg/mL (ref 211–911)

## 2021-07-07 NOTE — Patient Instructions (Addendum)
Your provider has requested that you go to the basement level for lab work before leaving today. Press "B" on the elevator. The lab is located at the first door on the left as you exit the elevator.  Due to recent changes in healthcare laws, you may see the results of your imaging and laboratory studies on MyChart before your provider has had a chance to review them.  We understand that in some cases there may be results that are confusing or concerning to you. Not all laboratory results come back in the same time frame and the provider may be waiting for multiple results in order to interpret others.  Please give [place] 48 hours in order for your provider to thoroughly review all the results before contacting the office for clarification of your results.   Follow up in one year pending lab results.  We have provided you with diet information to read.  I appreciate the opportunity to care for you. Silvano Rusk, MD, [facility]

## 2021-07-07 NOTE — Progress Notes (Signed)
Dale Mcdonald 71 y.o. [birth year] [id number]  Assessment & Plan:   Encounter Diagnoses  Name Primary?   NAFLD (nonalcoholic fatty liver disease) Yes   Mild anemia    I have reemphasized the need for lower carbohydrate diet and suggest he try time restricted eating/intermittent fasting.  Handouts and book recommendations provided.  I think this is a good approach to try to lose abdominal fat and improve fatty liver changes.  The other issue is his [place] Odor can cause steatosis as well.  He clearly has some abdominal adiposity and diabetes so weight loss there cannot hurt but it may not "fix" things.  He does not have cirrhosis.  He had elastography in March of last year with median K PA of 1.9 which is a high probability of being normal.  His platelets are normal as well.  Mild anemia will be evaluated as below.  Note he was referred to me through infectious disease primary care has sent him to [facility] for colonoscopy.  At some point we should probably coalesce his GI care.  I think he prefers to stay with me.  Orders Placed This Encounter  Procedures   CBC with Differential/Platelet   Comprehensive metabolic panel   Vitamin T26   Ferritin   CC: [name], MD  Dr. Dietrich [place]  Subjective:   Chief Complaint: Fatty liver  HPI 71 year old white man with a history of chronic hepatitis B and HIV with nonalcoholic fatty liver disease here for follow-up.  Recent ultrasound with cholelithiasis and diffuse increased echotexture with mild nodular contour of the liver "the findings could be seen in cirrhosis of the liver".  No known cirrhosis to date.  In fact hepatic elastography in March 2022 without cirrhosis.  In fact no significant fibrosis.  He had a heme positive stool found through primary care performed in the setting of mild anemia with hemoglobin 12.7 back in October.  This was an iFOBT.  Follow-up hemoglobin 13.2 in December.  He had a colonoscopy at [facility] in January  2023 with hemorrhoids and anal tags, a 5 mm adenomatous sigmoid polyp and diverticulosis. Wt Readings from Last 3 Encounters:  07/07/21 196 lb (88.9 kg)  06/03/21 188 lb (85.3 kg)  05/02/21 195 lb (88.5 kg)    Allergies  Allergen Reactions   Magnesium Nausea Only   Mcdonald Meds  Medication Sig   allopurinol (ZYLOPRIM) 300 MG tablet TAKE 1 TABLET BY MOUTH EVERY DAY (Patient taking differently: daily as needed.)   atorvastatin (LIPITOR) 20 MG tablet TAKE 1 TABLET BY MOUTH  DAILY   bictegravir-emtricitabine-tenofovir AF (BIKTARVY) 50-200-25 MG TABS tablet Take 1 tablet by mouth daily.   carvedilol (COREG) 3.125 MG tablet Take 3.125 mg by mouth 2 (two) times daily.   colchicine 0.6 MG tablet Take 1 tablet (0.6 mg total) by mouth 2 (two) times daily as needed.   Cyanocobalamin (B-12 PO) Take by mouth.   Ferrous Sulfate (IRON PO) Take by mouth daily.   fluconazole (DIFLUCAN) 150 MG tablet Take 150 mg by mouth once a week. prn   KLOR-CON M20 20 MEQ tablet Take 20 mEq by mouth daily as needed.   Melatonin 5 MG TABS Take 5 mg by mouth at bedtime.    Multiple Vitamin (MULTI-VITAMIN) tablet Take by mouth.   pantoprazole (PROTONIX) 40 MG tablet TAKE 1 TABLET BY MOUTH  DAILY   sacubitril-valsartan (ENTRESTO) 97-103 MG Take 1 tablet by mouth every 12 (twelve) hours.   spironolactone (ALDACTONE) 25 MG  tablet Take 25 mg by mouth daily.   torsemide (DEMADEX) 20 MG tablet 1-2 tabs daily as needed   traZODone (DESYREL) 50 MG tablet TAKE 1-2 TABLETS (50-100 MG TOTAL) BY MOUTH AT BEDTIME.   valACYclovir (VALTREX) 500 MG tablet Take 1 tablet (500 mg total) by mouth daily as needed (for fever blisters.).   Past Medical History:  Diagnosis Date   Anemia 04/07/2021   Anxiety 03/29/2017   Cirrhosis ([place])    Heart murmur    Hepatitis    B   HIV (human immunodeficiency virus infection) ([place])    Hx of adenomatous polyp of colon 02/08/2018   Hyperglycemia 04/17/2020   Hyperlipidemia    Hypertension     Orthostatic dizziness 04/07/2021   Palpitations    Past Surgical History:  Procedure Laterality Date   CATARACT EXTRACTION W/PHACO Left 11/11/2017   Procedure: CATARACT EXTRACTION PHACO AND INTRAOCULAR LENS PLACEMENT ([place]);  Surgeon: Birder Robson, MD;  Location: [facility] ORS;  Service: Ophthalmology;  Laterality: Left;  [place] 00:59.3 AP% 19.5 CDE 11.54 Fluid pack lot # [id number]   CATARACT EXTRACTION W/PHACO Right 12/21/2017   Procedure: CATARACT EXTRACTION PHACO AND INTRAOCULAR LENS PLACEMENT ([facility]);  Surgeon: Birder Robson, MD;  Location: [facility] ORS;  Service: Ophthalmology;  Laterality: Right;  [place] 00:40 AP% 13.5 CDE 5.50 Fluid pack lot $ [id number]   COLONOSCOPY     COLONOSCOPY WITH PROPOFOL N/A 02/08/2018   Procedure: COLONOSCOPY WITH PROPOFOL;  Surgeon: Jonathon Bellows, MD;  Location: [facility] ENDOSCOPY;  Service: Gastroenterology;  Laterality: N/A;   COLONOSCOPY WITH PROPOFOL N/A 06/03/2021   Procedure: COLONOSCOPY WITH PROPOFOL;  Surgeon: Lin Landsman, MD;  Location: [facility] ENDOSCOPY;  Service: Gastroenterology;  Laterality: N/A;   CORONARY ARTERY BYPASS GRAFT  04/07/2018   EYE SURGERY     RETINA   HERNIA REPAIR     MITRAL VALVE REPLACEMENT  03/2018   bovine--with CABG   Social History   Social History Narrative   Retired and widowed   1 son   Former smoker   3 caffeinated beverages a day   No alcohol no drug use no smokeless tobacco      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   Review of Systems  As above Objective:   Physical Exam BP (!) 102/52    Pulse 60    Ht 5' 9.5" (1.765 m)    Wt 196 lb (88.9 kg)    BMI 28.53 kg/m  NAD Abdomen somewhat obese

## 2021-07-08 ENCOUNTER — Encounter: Payer: Self-pay | Admitting: Internal Medicine

## 2021-07-08 DIAGNOSIS — K760 Fatty (change of) liver, not elsewhere classified: Secondary | ICD-10-CM

## 2021-07-08 HISTORY — DX: Fatty (change of) liver, not elsewhere classified: K76.0

## 2021-07-08 LAB — COMPREHENSIVE METABOLIC PANEL
ALT: 38 U/L (ref 0–53)
AST: 34 U/L (ref 0–37)
Albumin: 4.8 g/dL (ref 3.5–5.2)
Alkaline Phosphatase: 94 U/L (ref 39–117)
BUN: 25 mg/dL — ABNORMAL HIGH (ref 6–23)
CO2: 32 mEq/L (ref 19–32)
Calcium: 10.1 mg/dL (ref 8.4–10.5)
Chloride: 100 mEq/L (ref 96–112)
Creatinine, Ser: 1.54 mg/dL — ABNORMAL HIGH (ref 0.40–1.50)
GFR: 45.6 mL/min — ABNORMAL LOW (ref >60.00)
Glucose, Bld: 119 mg/dL — ABNORMAL HIGH (ref 70–99)
Potassium: 4.6 mEq/L (ref 3.5–5.1)
Sodium: 139 mEq/L (ref 135–145)
Total Bilirubin: 0.9 mg/dL (ref 0.2–1.2)
Total Protein: 7.7 g/dL (ref 6.0–8.3)

## 2021-07-10 ENCOUNTER — Telehealth: Payer: Self-pay

## 2021-07-10 NOTE — Progress Notes (Signed)
Chronic Care Management Pharmacy Assistant   Name: Dale Mcdonald  MRN: [id number] DOB: 01/15/1951  Reason for Encounter: CCM (Initial Questions)   Recent office visits:  No visit in past 6 months.  Last visit 05/02/2021 - Dale Simpler, MD - Patient presented for anemia. Labs: CBC. Stop due to patient preference aspirin EC 81 MG tablet.  Recent consult visits:  07/10/2021 - Dale Mcdonald - Dentistry - Patient presented for dental exam and cleaning.  No other visits in past 6 months.   Hospital visits:  None in previous 6 months  Medications: Outpatient Encounter Medications as of 07/10/2021  Medication Sig   allopurinol (ZYLOPRIM) 300 MG tablet TAKE 1 TABLET BY MOUTH EVERY DAY (Patient taking differently: daily as needed.)   atorvastatin (LIPITOR) 20 MG tablet TAKE 1 TABLET BY MOUTH  DAILY   bictegravir-emtricitabine-tenofovir AF (BIKTARVY) 50-200-25 MG TABS tablet Take 1 tablet by mouth daily.   carvedilol (COREG) 3.125 MG tablet Take 3.125 mg by mouth 2 (two) times daily.   colchicine 0.6 MG tablet Take 1 tablet (0.6 mg total) by mouth 2 (two) times daily as needed.   Cyanocobalamin (B-12 PO) Take by mouth.   Ferrous Sulfate (IRON PO) Take by mouth daily.   fluconazole (DIFLUCAN) 150 MG tablet Take 150 mg by mouth once a week. prn   KLOR-CON M20 20 MEQ tablet Take 20 mEq by mouth daily as needed.   Melatonin 5 MG TABS Take 5 mg by mouth at bedtime.    Multiple Vitamin (MULTI-VITAMIN) tablet Take by mouth.   pantoprazole (PROTONIX) 40 MG tablet TAKE 1 TABLET BY MOUTH  DAILY   sacubitril-valsartan (ENTRESTO) 97-103 MG Take 1 tablet by mouth every 12 (twelve) hours.   spironolactone (ALDACTONE) 25 MG tablet Take 25 mg by mouth daily.   torsemide (DEMADEX) 20 MG tablet 1-2 tabs daily as needed   traZODone (DESYREL) 50 MG tablet TAKE 1-2 TABLETS (50-100 MG TOTAL) BY MOUTH AT BEDTIME.   valACYclovir (VALTREX) 500 MG tablet Take 1 tablet (500 mg total) by mouth daily as needed  (for fever blisters.).   No facility-administered encounter medications on file as of 07/10/2021.   Lab Results  Component Value Date/Time   HGBA1C 6.4 (H) 04/17/2020 09:40 AM   HGBA1C 6.2 (H) 05/10/2019 09:08 AM    BP Readings from Last 3 Encounters:  07/07/21 (!) 102/52  06/03/21 112/76  05/02/21 112/60   Patient contacted to review initial questions prior to visit with Charlene Brooke.  Have you seen any other providers since your last visit with PCP? No  Any changes in your medications or health? No  Any side effects from any medications? No  Do you have an symptoms or problems not managed by your medications? No  Any concerns about your health right now? No  Has your provider asked that you check blood pressure, blood sugar, or follow special diet at home? Yes - Patient takes it before medication in the morning.  Date  Blood Pressure 02/17  118/68 02/16  --------- 02/15  121/63 02/14  120/62 02/13  126/67 02/12  121/64 02/11  115/57 02/10  111/56  Do you get any type of exercise on a regular basis? Yes Patient goes to the [employer] at least 3 days a week.   Can you think of a goal you would like to reach for your health? Yes - Patient would like to lose some weight due to fatty liver. Patient is trying to cutting carbs. Patient would  like to lose 10 lbs. He would also like to maintain or improve heart status.   Do you have any problems getting your medications? No  Is there anything that you would like to discuss during the appointment? No  Spoke with patient and reminded them to have all medications, supplements and any blood glucose and blood pressure readings available for review with pharmacist, at their telephone visit on 07/16/2021 at 9:30 am.    Star Rating Drugs:  Medication:    Last Fill: Day Supply Sacubitril-Valsartan 97-103 mg 04/28/2021 90 Atorvastatin 20 mg   06/06/2021 90  Care Gaps: Annual wellness visit in last year? No Most Recent BP reading:  102/52 on 07/07/2021  Charlene Brooke, CPP notified  Marijean Niemann, [place] Clinical Pharmacy Assistant [phone]  Time Spent: 25 Minutes

## 2021-07-16 ENCOUNTER — Ambulatory Visit (INDEPENDENT_AMBULATORY_CARE_PROVIDER_SITE_OTHER): Payer: Medicare Other | Admitting: Pharmacist

## 2021-07-16 ENCOUNTER — Other Ambulatory Visit: Payer: Self-pay

## 2021-07-16 DIAGNOSIS — I2510 Atherosclerotic heart disease of native coronary artery without angina pectoris: Secondary | ICD-10-CM

## 2021-07-16 DIAGNOSIS — I5022 Chronic systolic (congestive) heart failure: Secondary | ICD-10-CM

## 2021-07-16 DIAGNOSIS — I10 Essential (primary) hypertension: Secondary | ICD-10-CM

## 2021-07-16 DIAGNOSIS — B20 Human immunodeficiency virus [HIV] disease: Secondary | ICD-10-CM

## 2021-07-16 DIAGNOSIS — G4700 Insomnia, unspecified: Secondary | ICD-10-CM

## 2021-07-16 DIAGNOSIS — E785 Hyperlipidemia, unspecified: Secondary | ICD-10-CM

## 2021-07-16 DIAGNOSIS — M1000 Idiopathic gout, unspecified site: Secondary | ICD-10-CM

## 2021-07-16 DIAGNOSIS — N1832 Chronic kidney disease, stage 3b: Secondary | ICD-10-CM

## 2021-07-16 NOTE — Progress Notes (Signed)
Chronic Care Management Pharmacy Note  07/16/2021 Name:  Dale Mcdonald MRN:  [id number] DOB:  03-10-1951  Summary: CCM Initial visit -Pt reports BP at goal at home, generally < 120/70 -Pt reports Entresto cost is high; he also stopped Wilder Glade previously due to high cost. He qualifies for [employer] that covers both of these -Pt stopped taking allopurinol/colchicine over a year ago and denies any gout flares; it is reasonable to continue without medications, control gout with diet  Recommendations/Changes made from today's visit: -Enrolled patient in [employer] for [employer]. [facility] (mail order will not accept). Consider restarting Farxiga for HF and CKD benefits - pt to discuss with cardiologist tomorrow -Removed allopurinol and colchicine from med list  Plan: -[place] will call patient 1 month for general adherence review/med cost update -Pharmacist follow up televisit scheduled for 9 months -PCP AWV 01/15/22   Subjective: AVERI CACIOPPO is an 71 y.o. year old male who is a primary patient of [name], MD.  The CCM team was consulted for assistance with disease management and care coordination needs.    Engaged with patient by telephone for initial visit in response to provider referral for pharmacy case management and/or care coordination services.   Consent to Services:  The patient was given the following information about Chronic Care Management services today, agreed to services, and gave verbal consent: 1. CCM service includes personalized support from designated clinical staff supervised by the primary care provider, including individualized plan of care and coordination with other care providers 2. 24/7 contact phone numbers for assistance for urgent and routine care needs. 3. Service will only be billed when office clinical staff spend 20 minutes or more in a month to coordinate care. 4. Only one  practitioner may furnish and bill the service in a calendar month. 5.The patient may stop CCM services at any time (effective at the end of the month) by phone call to the office staff. 6. The patient will be responsible for cost sharing (co-pay) of up to 20% of the service fee (after annual deductible is met). Patient agreed to services and consent obtained.  Patient Care Team: [name], MD as PCP - General Tommy Medal, Lavell Islam, MD as PCP - Infectious Diseases (Infectious Diseases) Charlton Haws, [facility] as Pharmacist (Pharmacist)  Recent office visits: 05/02/2021 - [name], MD - Patient presented for anemia. Repeat CBC - anemia has normalized. Stop due to patient preference: aspirin EC 81 MG tablet.  01/09/21 Dr Silvio Pate OV: annual visit   Recent consult visits: 07/10/2021 - [name] - Dentistry - Dental exam 07/07/21 Dr Carlean Purl (GI): f/u NAFLD; advised low carb diet, intermittent fasting. No cirrhosis.   04/07/21 Dr Tommy Medal (ID): f/u HIV. No med changes.  03/11/21 Dr Mosetta Pigeon [facility]): f/u HF. Resolved Afib diagnosis. Unable to afford SGLT2 Wilder Glade). C/o jitteriness, unlikely due to [facility] per MD. F/U March 2023.  Hospital visits: None in previous 6 months   Objective:  Lab Results  Component Value Date   CREATININE 1.54 (H) 07/07/2021   BUN 25 (H) 07/07/2021   GFR 45.60 (L) 07/07/2021   EGFR 47 (L) 03/24/2021   GFRNONAA 47 (L) 04/02/2020   GFRAA 54 (L) 04/02/2020   NA 139 07/07/2021   K 4.6 07/07/2021   CALCIUM 10.1 07/07/2021   CO2 32 07/07/2021   GLUCOSE 119 (H) 07/07/2021    Lab Results  Component Value Date/Time   HGBA1C 6.4 (H) 04/17/2020 09:40 AM   HGBA1C 6.2 (H) 05/10/2019 09:08 AM   GFR 45.60 (L) 07/07/2021 11:12 AM   GFR 34.90 (L) 01/09/2021 03:15 PM    Last diabetic Eye exam: No results found for: HMDIABEYEEXA  Last diabetic Foot exam: No results found for: HMDIABFOOTEX   Lab Results  Component Value Date   CHOL 138  01/09/2021   HDL 52.00 01/09/2021   LDLCALC 60 01/09/2021   LDLDIRECT 79.0 12/28/2016   TRIG 130.0 01/09/2021   CHOLHDL 3 01/09/2021    Hepatic Function Latest Ref Rng & Units 07/07/2021 03/24/2021 01/09/2021  Total Protein 6.0 - 8.3 g/dL 7.7 7.2 -  Albumin 3.5 - 5.2 g/dL 4.8 - 4.4  AST 0 - 37 U/L 34 27 -  ALT 0 - 53 U/L 38 34 -  Alk Phosphatase 39 - 117 U/L 94 - -  Total Bilirubin 0.2 - 1.2 mg/dL 0.9 0.5 -  Bilirubin, Direct 0.0 - 0.3 mg/dL - - -    Lab Results  Component Value Date/Time   TSH 2.01 10/30/2008 10:11 AM   TSH 1.64 10/28/2007 09:22 AM   FREET4 1.05 01/09/2021 03:15 PM   FREET4 0.68 12/25/2015 09:52 AM    CBC Latest Ref Rng & Units 07/07/2021 05/02/2021 03/24/2021  WBC 4.0 - 10.5 K/uL 5.8 6.3 5.2  Hemoglobin 13.0 - 17.0 g/dL 13.4 13.2 12.7(L)  Hematocrit 39.0 - 52.0 % 39.7 39.2 37.2(L)  Platelets 150.0 - 400.0 K/uL 170.0 182.0 183    Lab Results  Component Value Date/Time   VD25OH 27.65 (L) 01/09/2021 03:15 PM    Clinical ASCVD: Yes  The 10-year ASCVD risk score (Arnett DK, et al., 2019) is: 13.4%   Values used to calculate the score:     Age: 21 years     Sex: Male     Is Non-Hispanic African American: No     Diabetic: No     Tobacco smoker: No     Systolic Blood Pressure: 625 mmHg     Is BP treated: Yes     HDL Cholesterol: 52 mg/dL     Total Cholesterol: 138 mg/dL    CHA2DS2/VAS Stroke Risk Points  Current as of yesterday     4 >= 2 Points: High Risk  1 - 1.99 Points: Medium Risk  0 Points: Low Risk    Last Change: N/A       Points Metrics  1 Has Congestive Heart Failure:  Yes    Current as of yesterday  1 Has Vascular Disease:  Yes    Current as of yesterday  1 Has Hypertension:  Yes    Current as of yesterday  1 Age:  43    Current as of yesterday  0 Has Diabetes:  No    Current as of yesterday  0 Had Stroke:  No  Had TIA:  No  Had Thromboembolism:  No    Current as of yesterday  0 Male:  No    Current as of yesterday        Depression screen [facility] 2/9 04/07/2021 01/09/2021 01/08/2020  Decreased Interest 0 0 0  Down, Depressed, Hopeless 0 0 0  PHQ - 2 Score 0 0 0  Altered sleeping - - -  Tired, decreased energy - - -  Change in appetite - - -  Feeling bad or failure about yourself  - - -  Trouble concentrating - - -  Moving slowly or fidgety/restless - - -  Suicidal thoughts - - -  PHQ-9 Score - - -  Difficult doing work/chores - - -  Some recent data might be hidden     Social History   Tobacco Use  Smoking Status Former   Packs/day: 2.00   Years: 40.00   Pack years: 80.00   Types: Cigarettes, E-cigarettes  Smokeless Tobacco Never  Tobacco Comments   Quit 05/25/2017/ cutting back   BP Readings from Last 3 Encounters:  07/07/21 (!) 102/52  06/03/21 112/76  05/02/21 112/60   Pulse Readings from Last 3 Encounters:  07/07/21 60  06/03/21 75  05/02/21 73   Wt Readings from Last 3 Encounters:  07/07/21 196 lb (88.9 kg)  06/03/21 188 lb (85.3 kg)  05/02/21 195 lb (88.5 kg)   BMI Readings from Last 3 Encounters:  07/07/21 28.53 kg/m  06/03/21 26.98 kg/m  05/02/21 27.58 kg/m    Assessment/Interventions: Review of patient past medical history, allergies, medications, health status, including review of consultants reports, laboratory and other test data, was performed as part of comprehensive evaluation and provision of chronic care management services.   SDOH:  (Social Determinants of Health) assessments and interventions performed: Yes SDOH Interventions    Flowsheet Row Most Recent Value  SDOH Interventions   Food Insecurity Interventions Intervention Not Indicated  Financial Strain Interventions Other (Comment)  [Enrolled in [place] for Delene Loll, Farxiga copays]      SDOH Screenings   Alcohol Screen: Not on file  Depression (PHQ2-9): Low Risk    PHQ-2 Score: 0  Financial Resource Strain: Medium Risk   Difficulty of Paying Living Expenses: Somewhat hard  Food Insecurity:  No Food Insecurity   Worried About Charity fundraiser in the Last Year: Never true   Ran Out of Food in the Last Year: Never true  Housing: Not on file  Physical Activity: Not on file  Social Connections: Not on file  Stress: Not on file  Tobacco Use: Medium Risk   Smoking Tobacco Use: Former   Smokeless Tobacco Use: Never   Passive Exposure: Not on file  Transportation Needs: Not on file    [place]  Allergies  Allergen Reactions   Magnesium Nausea Only    Medications Reviewed Today     Reviewed by Charlton Haws, [place] (Pharmacist) on 07/16/21 at [place] List Status: <None>   Medication Order Taking? Sig Documenting Provider Last Dose Status Informant  Patient not taking:  Discontinued 07/16/21 1018 (Patient Preference)   atorvastatin (LIPITOR) 20 MG tablet [id number] Yes TAKE 1 TABLET BY MOUTH  DAILY [name], MD Taking Active   bictegravir-emtricitabine-tenofovir AF (BIKTARVY) 50-200-25 MG TABS tablet [id number] Yes Take 1 tablet by mouth daily. Tommy Medal, Lavell Islam, MD Taking Active   carvedilol (COREG) 3.125 MG tablet [id number] Yes Take 3.125 mg by mouth 2 (two) times daily. [provider] Taking Active   Patient not taking:  Discontinued 07/16/21 1018 (Patient Preference)   Cyanocobalamin (B-12 PO) [id number] Yes Take by mouth. [provider] Taking Active   Ferrous Sulfate (IRON PO) [id number] Yes Take by mouth daily. [provider] Taking Active   fluconazole (DIFLUCAN) 150 MG tablet [id number] Yes Take 150 mg by mouth once a week. prn [provider] Taking Active   KLOR-CON M20 20 MEQ tablet [id number] Yes Take 20 mEq by mouth daily as needed. [provider] Taking Active   Melatonin 5 MG TABS [id number] Yes Take 5 mg by mouth at bedtime.  [provider] Taking Active Self           Med Note (DAVIS, SOPHIA A   Tue Sep 24, 2020  2:50 PM)    Multiple Vitamin (MULTI-VITAMIN) tablet [id number] Yes Take  by mouth. [provider] Taking Active   pantoprazole (PROTONIX) 40 MG tablet [id number] Yes TAKE 1 TABLET BY MOUTH  DAILY [name], MD Taking Active   sacubitril-valsartan (ENTRESTO) 97-103 MG [id number] Yes Take 1 tablet by mouth every 12 (twelve) hours. [provider] Taking Active   spironolactone (ALDACTONE) 25 MG tablet [id number] Yes Take 25 mg by mouth daily. [provider] Taking Active   torsemide (DEMADEX) 20 MG tablet [id number] Yes 1-2 tabs daily as needed [provider] Taking Active   traZODone (DESYREL) 50 MG tablet [id number] Yes TAKE 1-2 TABLETS (50-100 MG TOTAL) BY MOUTH AT BEDTIME. [name], MD Taking Active   valACYclovir (VALTREX) 500 MG tablet [id number] Yes Take 1 tablet (500 mg total) by mouth daily as needed (for fever blisters.). Tommy Medal, Lavell Islam, MD Taking Active             Patient Active Problem List   Diagnosis Date Noted   NAFLD (nonalcoholic fatty liver disease) 07/08/2021   Polyp of sigmoid colon    Anemia, mild 05/02/2021   Orthostatic dizziness 04/07/2021   Anemia 04/07/2021   Hyperglycemia 04/17/2020   Gout 01/08/2020   Stage 3b chronic kidney disease ([place]) 01/08/2020   Vertical fracture of root of tooth 08/01/2018   Chronic anticoagulation 05/29/2018   Xerostomia 05/29/2018   Paroxysmal atrial fibrillation ([place]) 58/01/9832   Chronic systolic heart failure ([place]) 05/05/2018   Ejection fraction < 50% 04/12/2018   S/P CABG x 3 04/10/2018   S/P mitral valve replacement with bioprosthetic valve 04/10/2018   Decreased strength, endurance, and mobility 04/05/2018   Decreased activities of daily living (ADL) 04/05/2018   Coronary artery disease due to lipid rich plaque 04/04/2018   H/O adenomatous polyp of colon 02/08/2018   LVH (left ventricular hypertrophy) 01/27/2018   Mitral regurgitation 01/03/2018   Advance directive discussed with patient 01/03/2018   Anxiety 03/29/2017   Episodic mood  disorder ([place]) 09/24/2014   Routine general medical examination at a health care facility 07/25/2012   Insomnia 02/04/2009   Chronic hepatitis B virus infection ([place]) 12/03/2008   Human immunodeficiency virus (HIV) disease ([place]) 11/21/2008   Hyperlipemia 10/28/2007   Essential hypertension 10/28/2007   ALLERGIC RHINITIS 10/28/2007   ACTINIC KERATOSIS 10/28/2007    Immunization History  Administered Date(s) Administered   Hepatitis A 02/04/2009, 11/07/2009   Influenza Split 02/19/2011, 02/10/2012, 02/16/2013   Influenza Whole 02/17/2008, 02/06/2009, 02/11/2010   Influenza, High Dose Seasonal PF 03/18/2017, 03/27/2018, 03/08/2019   Influenza,inj,Quad PF,6+ Mos 01/22/2016   Influenza-Unspecified 02/10/2014, 03/18/2020, 02/18/2021   Moderna Covid-19 Vaccine Bivalent Booster 71yrs & up 02/18/2021   PFIZER Comirnaty(Gray Top)Covid-19 Tri-Sucrose Vaccine 06/22/2019, 07/14/2019, 01/26/2020, 08/10/2020   PFIZER(Purple Top)SARS-COV-2 Vaccination 06/22/2019, 07/14/2019, 01/26/2020   Pneumococcal Conjugate-13 12/25/2015   Pneumococcal Polysaccharide-23 02/04/2009, 12/28/2016   Tdap 07/25/2012   Zoster Recombinat (Shingrix) 01/11/2019, 03/31/2019   Zoster, Live 12/21/2013    Conditions to be addressed/monitored:  Hypertension, Hyperlipidemia, Heart Failure, Coronary Artery Disease, GERD, Chronic Kidney Disease, and Gout, HIV  Care Plan : [place]  Updates made by Charlton Haws, [place] since 07/16/2021 12:00 AM     Problem: Hypertension, Hyperlipidemia, Heart Failure, Coronary Artery Disease, GERD, Chronic Kidney Disease, and Gout, HIV  Priority: High     Long-Range Goal: Disease mgmt   Start Date: 07/16/2021  Expected End Date: 07/16/2022  This Visit's Progress: On track  Priority: High  Note:   Current Barriers:  Unable to independently afford treatment regimen Unable to independently monitor therapeutic efficacy  Pharmacist Clinical Goal(s):  Patient will  verbalize ability to afford treatment regimen achieve adherence to monitoring guidelines and medication adherence to achieve therapeutic efficacy through collaboration with PharmD and provider.   Interventions: 1:1 collaboration with [name], MD regarding development and update of comprehensive plan of care as evidenced by provider attestation and co-signature Inter-disciplinary care team collaboration (see longitudinal plan of care) Comprehensive medication review performed; medication list updated in electronic medical record  Hypertension / Heart Failure / CKD (BP goal <130/80) -Controlled - pt reports BP at home is at goal, sometimes low-normal; he takes an extra torsemide about once a week (pt weights himself daily); pt reports Delene Loll is expensive, he was on Farxiga for about a month before and stopped due to cost -Last ejection fraction: 25%(Date: 07/2020) -HF type: Systolic; NYHA Class: III (marked limitation of activity) -Current home BP readings: lowest 102/50 ~2 hr after taking medications 02/17               118/68 02/15               121/63 02/14               120/62 02/13               126/67 02/12               121/64 02/11               115/57 02/10               111/56 -Current treatment: Carvedilol 3.125 mg BID - Appropriate, Effective, Safe, Accessible Entresto 97-103 mg BID -Appropriate, Effective, Safe, Query Accessible Spironolactone 25 mg daily AM -Appropriate, Effective, Safe, Accessible Torsemide 20 mg daily , extra tab PRN -Appropriate, Effective, Safe, Accessible [place] Con 20 mEq daily PRN -Appropriate, Effective, Safe, Accessible -Medications previously tried: amlodipine, lisinopril, hctz, metoprolol, Farxiga (cost) -Educated on BP goals and benefits of medications for prevention of heart attack, stroke and kidney damage; -Discussed benefits of Entresto and Farxiga for reduction in HF hospitalizations and death; pt would benefit from [place] if cost is  not a barrier -Assessed pt finances - he qualifies for [employer] Cardiomyopathy grant that will cover Delene Loll and Wilder Glade (if it is to be restarted - he has appt with cardiologist tomorrow and plans to discuss then) -Recommended to continue current medication; consider restarting Farxiga 10 mg for HF and CKD benefits (pt to discuss with cardiology)  Hyperlipidemia / CAD: (LDL goal < 70) -Controlled - LDL is at goal; pt endorses compliance with statin; he reports his cardiologist told him regarding aspirin he "could take it or leave it"; he reports issues with skin/bruises while on aspirin, denies any major bleeding -Hx CABG x 3 (03/2018) -Current treatment: Atorvastatin 20 mg daily - Appropriate, Effective, Safe, Accessible -Educated on Cholesterol goals; Benefits of statin for ASCVD risk reduction; -Recommended to continue current medication  Gout (Goal: prevent flares) -Controlled - pt reports last flare over a year ago, he stopped allopurinol about 1 year ago and still no flares -Current treatment  Allopurinol 300 mg daily - not taking Colchicine 0.6 mg BID prn - not taking -Discussed low purine diet  to control gout; it is reasonable to continue without prophylaxis since he has been gout free > 1 year without mediction -Removed medications from list  HIV (Goal: maintain suppression) -Controlled - per chart review HIV RNA is undetectable; pt reports concern that Biktarvy is contributing to abdominal fat; he is trying to change diet per GI recommendations  -Hx chronic Hepatitis B -Current treatment  Biktarvy 1 tab daily - Appropriate, Effective, Safe, Accessible -Discussed overall benefits of Biktarvy vs low risks; advised he discuss concerns with ID -Recommended to continue current medication  Insomnia (Goal: improve sleep) -Controlled - takes trazodone every night and reports he does need it; sometimes takes 2 -Current treatment  Trazodone 50 mg daily HS - Appropriate, Effective,  Safe, Accessible Melatonin 5 mg HS - Appropriate, Effective, Safe, Accessible -Medications previously tried: zolpidem  -Recommended to continue current medication  GERD (Goal: manage symptoms) -Controlled - pt reports GERD improved after retiring due to reduced stress, he has reduced dosing to every other day and denies issues -Current treatment  Pantoprazole 40 mg QOD - Appropriate, Effective, Safe, Accessible -Discussed he can use PPI PRN at this point  Health Maintenance -Vaccine gaps: None -Dx NASH - follows with GI who has advised low carb diet and intermittent fasting -Hx Afib - per chart review appears isolated episode, cardiology has resolved Afib diagnosis in chart -Current therapy:  Vitamin B12 Ferrous sulfate Multivitamin Fluconazole 150 mg weekly PRN Valacyclovir 500 mg PRN -Patient is satisfied with current therapy and denies issues -Recommended to continue current medication  Patient Goals/Self-Care Activities Patient will:  - take medications as prescribed as evidenced by patient report and record review focus on medication adherence by routine check blood pressure daily, document, and provide at future appointments weigh daily, and contact provider if weight gain of 5+ lbs collaborate with provider on medication access solutions Delene Loll, Wilder Glade) engage in dietary modifications by reducing carbs, high-purine foods       Medication Assistance:  Enrolled in [place] - cardiomyopathy fund for [employer] (and possibly [place]) Active 06/16/21 - 06/15/22 BIN: [id number]  PCN [id number] ID: [id number]  NID:[id number]  Compliance/Adherence/Medication fill history: Care Gaps: NONE  Star-Rating Drugs: Atorvastatin - PDC 100%  Patient's preferred pharmacy is: [facility] for maintenance meds. Biktarvy at [facility] due to PAF[facility]  [facility], [address] [place] [address] Phone:  [phone] Fax: [phone]  [facility] ([place], [place] [facility] [address] [place] [address] Phone: [phone] Fax: [phone]  [id number] [address], [place] [address] Phone: [phone] Fax: [phone]  Uses pill box? Yes Pt endorses 100% compliance  We discussed: Current pharmacy is preferred with insurance plan and patient is satisfied with pharmacy services Patient decided to: Continue current medication management strategy  Care Plan and Follow Up Patient Decision:  Patient agrees to Care Plan and Follow-up.  Plan: Telephone follow up appointment with care management team member scheduled for:  6 months  Charlene Brooke, PharmD, BCACP Clinical Pharmacist [facility] [phone]

## 2021-07-16 NOTE — Patient Instructions (Signed)
Visit Information  Phone number for Pharmacist: [phone]  Thank you for meeting with me to discuss your medications! I look forward to working with you to achieve your health care goals. Below is a summary of what we talked about during the visit:   Goals Addressed             This Visit's Progress    Track and Manage Fluids and Swelling-Heart Failure       Timeframe:  Long-Range Goal Priority:  High Start Date:    07/16/21                         Expected End Date: 07/16/22                       Follow Up Date Nov 2023   - keep legs up while sitting - track weight in diary - use salt in moderation - watch for swelling in feet, ankles and legs every day - weigh myself daily    Why is this important?   It is important to check your weight daily and watch how much salt and liquids you have.  It will help you to manage your heart failure.    Notes:         Care Plan : [place]  Updates made by Charlton Haws, RPH since 07/16/2021 12:00 AM     Problem: Hypertension, Hyperlipidemia, Heart Failure, Coronary Artery Disease, GERD, Chronic Kidney Disease, and Gout, HIV   Priority: High     Long-Range Goal: Disease mgmt   Start Date: 07/16/2021  Expected End Date: 07/16/2022  This Visit's Progress: On track  Priority: High  Note:   Current Barriers:  Unable to independently afford treatment regimen Unable to independently monitor therapeutic efficacy  Pharmacist Clinical Goal(s):  Patient will verbalize ability to afford treatment regimen achieve adherence to monitoring guidelines and medication adherence to achieve therapeutic efficacy through collaboration with PharmD and provider.   Interventions: 1:1 collaboration with [name], MD regarding development and update of comprehensive plan of care as evidenced by provider attestation and co-signature Inter-disciplinary care team collaboration (see longitudinal plan of care) Comprehensive  medication review performed; medication list updated in electronic medical record  Hypertension / Heart Failure / CKD (BP goal <130/80) -Controlled - pt reports BP at home is at goal, sometimes low-normal; he takes an extra torsemide about once a week (pt weights himself daily); pt reports Delene Loll is expensive, he was on Farxiga for about a month before and stopped due to cost -Last ejection fraction: 25%(Date: 07/2020) -HF type: Systolic; NYHA Class: III (marked limitation of activity) -Current home BP readings: lowest 102/50 ~2 hr after taking medications 02/17               118/68 02/15               121/63 02/14               120/62 02/13               126/67 02/12               121/64 02/11               115/57 02/10               111/56 -Current treatment: Carvedilol 3.125 mg BID - Appropriate, Effective, Safe, Accessible Entresto 97-103 mg  BID -Appropriate, Effective, Safe, Query Accessible Spironolactone 25 mg daily AM -Appropriate, Effective, Safe, Accessible Torsemide 20 mg daily , extra tab PRN -Appropriate, Effective, Safe, Accessible Klor Con 20 mEq daily PRN -Appropriate, Effective, Safe, Accessible -Medications previously tried: amlodipine, lisinopril, hctz, metoprolol, Farxiga (cost) -Educated on BP goals and benefits of medications for prevention of heart attack, stroke and kidney damage; -Discussed benefits of [place] for reduction in HF hospitalizations and death; pt would benefit from [place] if cost is not a barrier -Assessed pt finances - he qualifies for [employer] that will cover Delene Loll and Wilder Glade (if it is to be restarted - he has appt with cardiologist tomorrow and plans to discuss then) -Recommended to continue current medication; consider restarting Farxiga 10 mg for HF and CKD benefits (pt to discuss with cardiology)  Hyperlipidemia / CAD: (LDL goal < 70) -Controlled - LDL is at goal; pt endorses compliance with statin; he  reports his cardiologist told him regarding aspirin he "could take it or leave it"; he reports issues with skin/bruises while on aspirin, denies any major bleeding -Hx CABG x 3 (03/2018) -Current treatment: Atorvastatin 20 mg daily - Appropriate, Effective, Safe, Accessible -Educated on Cholesterol goals; Benefits of statin for ASCVD risk reduction; -Recommended to continue current medication  Gout (Goal: prevent flares) -Controlled - pt reports last flare over a year ago, he stopped allopurinol about 1 year ago and still no flares -Current treatment  Allopurinol 300 mg daily - not taking Colchicine 0.6 mg BID prn - not taking -Discussed low purine diet to control gout; it is reasonable to continue without prophylaxis since he has been gout free > 1 year without mediction -Removed medications from list  HIV (Goal: maintain suppression) -Controlled - per chart review HIV RNA is undetectable; pt reports concern that Biktarvy is contributing to abdominal fat; he is trying to change diet per GI recommendations  -Hx chronic Hepatitis B -Current treatment  Biktarvy 1 tab daily - Appropriate, Effective, Safe, Accessible -Discussed overall benefits of Biktarvy vs low risks; advised he discuss concerns with ID -Recommended to continue current medication  Insomnia (Goal: improve sleep) -Controlled - takes trazodone every night and reports he does need it; sometimes takes 2 -Current treatment  Trazodone 50 mg daily HS - Appropriate, Effective, Safe, Accessible Melatonin 5 mg HS - Appropriate, Effective, Safe, Accessible -Medications previously tried: zolpidem  -Recommended to continue current medication  GERD (Goal: manage symptoms) -Controlled - pt reports GERD improved after retiring due to reduced stress, he has reduced dosing to every other day and denies issues -Current treatment  Pantoprazole 40 mg QOD - Appropriate, Effective, Safe, Accessible -Discussed he can use PPI PRN at this  point  Health Maintenance -Vaccine gaps: None -Dx NASH - follows with GI who has advised low carb diet and intermittent fasting -Hx Afib - per chart review appears isolated episode, cardiology has resolved Afib diagnosis in chart -Current therapy:  Vitamin B12 Ferrous sulfate Multivitamin Fluconazole 150 mg weekly PRN Valacyclovir 500 mg PRN -Patient is satisfied with current therapy and denies issues -Recommended to continue current medication  Patient Goals/Self-Care Activities Patient will:  - take medications as prescribed as evidenced by patient report and record review focus on medication adherence by routine check blood pressure daily, document, and provide at future appointments weigh daily, and contact provider if weight gain of 5+ lbs collaborate with provider on medication access solutions Delene Loll, Farxiga) engage in dietary modifications by reducing carbs, high-purine foods  Mr. Janes was given information about Chronic Care Management services today including:  CCM service includes personalized support from designated clinical staff supervised by his physician, including individualized plan of care and coordination with other care providers 24/7 contact phone numbers for assistance for urgent and routine care needs. Standard insurance, coinsurance, copays and deductibles apply for chronic care management only during months in which we provide at least 20 minutes of these services. Most insurances cover these services at 100%, however patients may be responsible for any copay, coinsurance and/or deductible if applicable. This service may help you avoid the need for more expensive face-to-face services. Only one practitioner may furnish and bill the service in a calendar month. The patient may stop CCM services at any time (effective at the end of the month) by phone call to the office staff.  Patient agreed to services and verbal consent obtained.   Patient  verbalizes understanding of instructions and care plan provided today and agrees to view in [place]. Active MyChart status confirmed with patient.   Telephone follow up appointment with pharmacy team member scheduled for: 9 months  Charlene Brooke, PharmD, BCACP Clinical Pharmacist [facility] at [facility] [phone]

## 2021-07-17 DIAGNOSIS — I110 Hypertensive heart disease with heart failure: Secondary | ICD-10-CM | POA: Diagnosis not present

## 2021-07-17 DIAGNOSIS — Z79899 Other long term (current) drug therapy: Secondary | ICD-10-CM | POA: Diagnosis not present

## 2021-07-17 DIAGNOSIS — I5020 Unspecified systolic (congestive) heart failure: Secondary | ICD-10-CM | POA: Diagnosis not present

## 2021-07-17 DIAGNOSIS — I5022 Chronic systolic (congestive) heart failure: Secondary | ICD-10-CM | POA: Diagnosis not present

## 2021-07-22 DIAGNOSIS — I2583 Coronary atherosclerosis due to lipid rich plaque: Secondary | ICD-10-CM

## 2021-07-22 DIAGNOSIS — I5022 Chronic systolic (congestive) heart failure: Secondary | ICD-10-CM

## 2021-07-22 DIAGNOSIS — E785 Hyperlipidemia, unspecified: Secondary | ICD-10-CM | POA: Diagnosis not present

## 2021-07-22 DIAGNOSIS — I10 Essential (primary) hypertension: Secondary | ICD-10-CM | POA: Diagnosis not present

## 2021-07-22 DIAGNOSIS — I2510 Atherosclerotic heart disease of native coronary artery without angina pectoris: Secondary | ICD-10-CM

## 2021-07-25 ENCOUNTER — Telehealth: Payer: Self-pay

## 2021-07-25 NOTE — Progress Notes (Signed)
? ? ?Chronic Care Management ?Pharmacy Assistant  ? ?Name: Dale Mcdonald  MRN: [id number] DOB: April 20, 1951 ? ?Reason for Encounter: CCM (General Adherence) ?  ?Recent office visits:  ?None since last CCM contact ? ?Recent consult visits:  ?07/17/2021 - Melvyn Novas, MD - Cardiology - Patient presented for heart failure with reduced ejection fraction. Procedure: Echocardiogram 2D ? ?Hospital visits:  ?None since last CCM contact ? ?Medications: ?Outpatient Encounter Medications as of 07/25/2021  ?Medication Sig  ? atorvastatin (LIPITOR) 20 MG tablet TAKE 1 TABLET BY MOUTH  DAILY  ? bictegravir-emtricitabine-tenofovir AF (BIKTARVY) 50-200-25 MG TABS tablet Take 1 tablet by mouth daily.  ? carvedilol (COREG) 3.125 MG tablet Take 3.125 mg by mouth 2 (two) times daily.  ? Cyanocobalamin (B-12 PO) Take by mouth.  ? Ferrous Sulfate (IRON PO) Take by mouth daily.  ? fluconazole (DIFLUCAN) 150 MG tablet Take 150 mg by mouth once a week. prn  ? KLOR-CON M20 20 MEQ tablet Take 20 mEq by mouth daily as needed.  ? Melatonin 5 MG TABS Take 5 mg by mouth at bedtime.   ? Multiple Vitamin (MULTI-VITAMIN) tablet Take by mouth.  ? pantoprazole (PROTONIX) 40 MG tablet TAKE 1 TABLET BY MOUTH  DAILY  ? sacubitril-valsartan (ENTRESTO) 97-103 MG Take 1 tablet by mouth every 12 (twelve) hours.  ? spironolactone (ALDACTONE) 25 MG tablet Take 25 mg by mouth daily.  ? torsemide (DEMADEX) 20 MG tablet 1-2 tabs daily as needed  ? traZODone (DESYREL) 50 MG tablet TAKE 1-2 TABLETS (50-100 MG TOTAL) BY MOUTH AT BEDTIME.  ? valACYclovir (VALTREX) 500 MG tablet Take 1 tablet (500 mg total) by mouth daily as needed (for fever blisters.).  ? ?No facility-administered encounter medications on file as of 07/25/2021.  ? ?Contacted MICHELE KERLIN on 07/29/2021 for general disease state and medication adherence call.  ? ?Patient is not more than 5 days past due for refill on the following medications per chart history: ? ?[facility] Medications: ?Medication  Name/mg  Last Fill Days Supply ?Sacubitril-Valsartan 97-103 mg          04/28/2021      90 ?Atorvastatin 20 mg                              06/06/2021      90 ? ?What concerns do you have about your medications? Patient does not have any concerns at this time.  ? ?The patient reports the following side effects with their medications. He believes the dose increase with the Delene Loll is making him extra thirsty.  ? ?[place] is working for [employer]? Patient did pick up his Delene Loll and his copay was $0. ? ?Did patient start Wilder Glade? Patient started [place] on 07/23/2021 and is not having any issues or side effects. Wilder Glade was also $0.  ? ?How often do you forget or accidentally miss a dose? Never ? ?Do you use a pillbox? Yes - Patient uses two pill boxes; one for morning and one for afternoon.  ? ?Are you having any problems getting your medications from your pharmacy? No ? ?Has the cost of your medications been a concern? Not any more since he is getting [place] for free.  ? ?Since last visit with CPP, no interventions have been made.  ? ?The patient has not had an ED visit since last contact.  ? ?The patient denies problems with their health.  ? ?Patient denies concerns or  questions for Charlene Brooke, PharmD at this time.  ? ?Care Gaps: ?Annual wellness visit in last year? No ?Most Recent BP reading: 102/52 on 07/07/2021 ? ?Upcoming appointments: ?No appointments scheduled within the next 30 days. ? ?Charlene Brooke, CPP notified ? ?Marijean Niemann, RMA ?Clinical Pharmacy Assistant ?[phone] ? ? ? ? ? ? ? ? ?

## 2021-08-11 ENCOUNTER — Encounter: Payer: Self-pay | Admitting: Infectious Disease

## 2021-08-14 NOTE — Telephone Encounter (Signed)
Patient was able to start Wilder Glade now that cost issues resolved. Added medication back to med list. ?

## 2021-08-27 DIAGNOSIS — H35371 Puckering of macula, right eye: Secondary | ICD-10-CM | POA: Diagnosis not present

## 2021-09-19 DIAGNOSIS — I5022 Chronic systolic (congestive) heart failure: Secondary | ICD-10-CM | POA: Diagnosis not present

## 2021-10-01 ENCOUNTER — Encounter: Payer: Self-pay | Admitting: Internal Medicine

## 2021-10-01 ENCOUNTER — Ambulatory Visit: Payer: Medicare Other | Admitting: Internal Medicine

## 2021-10-01 VITALS — BP 104/60 | HR 65 | Ht 70.0 in | Wt 190.8 lb

## 2021-10-01 DIAGNOSIS — R0789 Other chest pain: Secondary | ICD-10-CM | POA: Diagnosis not present

## 2021-10-01 NOTE — Patient Instructions (Signed)
If you are age 70 or older, your body mass index should be between 23-30. Your Body mass index is 27.38 kg/m?Marland Kitchen If this is out of the aforementioned range listed, please consider follow up with your Primary Care Provider. ? ?If you are age 9 or younger, your body mass index should be between 19-25. Your Body mass index is 27.38 kg/m?Marland Kitchen If this is out of the aformentioned range listed, please consider follow up with your Primary Care Provider.  ? ?________________________________________________________ ? ?The [place] [facility] providers would like to encourage you to use [facility] to communicate with providers for non-urgent requests or questions.  Due to long hold times on the telephone, sending your provider a message by [facility] may be a faster and more efficient way to get a response.  Please allow 48 business hours for a response.  Please remember that this is for non-urgent requests.  ?_______________________________________________________ ? ?I am glad the pain is gone , I do not think it's from your vitamins. ? ?I appreciate the opportunity to care for you. ?Silvano Rusk, MD, [facility] ?

## 2021-10-01 NOTE — Progress Notes (Signed)
? ?Dale Mcdonald 71 y.o. 01/11/1951 [id number] ? ?Assessment & Plan:  ? ?Encounter Diagnosis  ?Name Primary?  ? Left-sided chest wall pain Yes  ? ? ?This has resolved and I think was musculoskeletal and not GI in origin.  No additional work-up or treatment necessary follow-up as needed. ? ? ? ?Subjective:  ? ?Chief Complaint: Left upper quadrant pain ? ?HPI ?Last month for several days Burton had a very sharp tender area in the left upper quadrant in the ribs.  It hurt with movement bending and twisting.  He does not recall an injury.  It was not related to eating no bowel habit changes.  The pain is gone now.  He almost canceled this appointment but he thought he would follow through he is concerned about his health and what this might represent.  He has been working on reducing sugars and says he is cut those in about half.  He is encouraged to continue working on that. ? ?Wt Readings from Last 3 Encounters:  ?10/01/21 190 lb 12.8 oz (86.5 kg)  ?07/07/21 196 lb (88.9 kg)  ?06/03/21 188 lb (85.3 kg)  ? ? ?Allergies  ?Allergen Reactions  ? Magnesium Nausea Only  ? ?Current Meds  ?Medication Sig  ? atorvastatin (LIPITOR) 20 MG tablet TAKE 1 TABLET BY MOUTH  DAILY  ? bictegravir-emtricitabine-tenofovir AF (BIKTARVY) 50-200-25 MG TABS tablet Take 1 tablet by mouth daily.  ? carvedilol (COREG) 3.125 MG tablet Take 6.25 mg by mouth 2 (two) times daily.  ? dapagliflozin propanediol (FARXIGA) 10 MG TABS tablet Take 5 mg by mouth daily.  ? fluconazole (DIFLUCAN) 150 MG tablet Take 150 mg by mouth once a week. prn  ? KLOR-CON M20 20 MEQ tablet Take 20 mEq by mouth daily as needed.  ? Melatonin 5 MG TABS Take 5 mg by mouth at bedtime.   ? pantoprazole (PROTONIX) 40 MG tablet TAKE 1 TABLET BY MOUTH  DAILY  ? sacubitril-valsartan (ENTRESTO) 97-103 MG Take 1 tablet by mouth every 12 (twelve) hours.  ? spironolactone (ALDACTONE) 25 MG tablet Take 25 mg by mouth daily.  ? torsemide (DEMADEX) 20 MG tablet 10 mg. 1-2 tabs daily  as needed  ? traZODone (DESYREL) 50 MG tablet TAKE 1-2 TABLETS (50-100 MG TOTAL) BY MOUTH AT BEDTIME.  ? valACYclovir (VALTREX) 500 MG tablet Take 1 tablet (500 mg total) by mouth daily as needed (for fever blisters.).  ? ?Past Medical History:  ?Diagnosis Date  ? Anemia 04/07/2021  ? Anxiety 03/29/2017  ? Cirrhosis ([place])   ? Heart murmur   ? Hepatitis   ? B  ? HIV (human immunodeficiency virus infection) ([place])   ? Hx of adenomatous polyp of colon 02/08/2018  ? Hyperglycemia 04/17/2020  ? Hyperlipidemia   ? Hypertension   ? NAFLD (nonalcoholic fatty liver disease) 07/08/2021  ? Cause likely abdominal obesity/metabolic syndrome plus Biktarvy Hepatic elastography with high likelihood normal i.e. no cirrhosis 2022  ? Orthostatic dizziness 04/07/2021  ? Palpitations   ? ?Past Surgical History:  ?Procedure Laterality Date  ? CATARACT EXTRACTION W/PHACO Left 11/11/2017  ? Procedure: CATARACT EXTRACTION PHACO AND INTRAOCULAR LENS PLACEMENT (IOC);  Surgeon: Birder Robson, MD;  Location: [facility] ORS;  Service: Ophthalmology;  Laterality: Left;  [place] 00:59.3 ?AP% 19.5 ?CDE 11.54 ?Fluid pack lot # [id number]  ? CATARACT EXTRACTION W/PHACO Right 12/21/2017  ? Procedure: CATARACT EXTRACTION PHACO AND INTRAOCULAR LENS PLACEMENT (IOC);  Surgeon: Birder Robson, MD;  Location: [facility] ORS;  Service: Ophthalmology;  Laterality: Right;  [place] 00:40 ?AP% 13.5 ?CDE 5.50 ?Fluid pack lot $ [id number]  ? COLONOSCOPY    ? COLONOSCOPY WITH PROPOFOL N/A 02/08/2018  ? Procedure: COLONOSCOPY WITH PROPOFOL;  Surgeon: Jonathon Bellows, MD;  Location[facility] ENDOSCOPY;  Service: Gastroenterology;  Laterality: N/A;  ? COLONOSCOPY WITH PROPOFOL N/A 06/03/2021  ? Procedure: COLONOSCOPY WITH PROPOFOL;  Surgeon: Lin Landsman, MD;  Location: [facility] ENDOSCOPY;  Service: Gastroenterology;  Laterality: N/A;  ? CORONARY ARTERY BYPASS GRAFT  04/07/2018  ? EYE SURGERY    ? RETINA  ? HERNIA REPAIR    ? MITRAL VALVE REPLACEMENT  03/2018  ? bovine--with CABG  ? ?Social History   ? ?Social History Narrative  ? Retired and widowed  ? 1 son  ? Former smoker  ? 3 caffeinated beverages a day  ? No alcohol no drug use no smokeless tobacco  ?   ? 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   ? No living will  ? Would want son [name] to be decision maker  ? Would accept resuscitation but no prolonged ventilation  ? Not sure about tube feeds--but definitely not long term  ? ?family history is not on file. ? ? ?Review of Systems ?As above ? ?Objective:  ? Physical Exam ?BP 104/60   Pulse 65   Ht '5\' 10"'$  (1.778 m)   Wt 190 lb 12.8 oz (86.5 kg)   SpO2 97%   BMI 27.38 kg/m?  ? ?Abdomen and chest wall nontender ?

## 2021-10-28 ENCOUNTER — Other Ambulatory Visit: Payer: Self-pay | Admitting: Internal Medicine

## 2021-10-30 ENCOUNTER — Other Ambulatory Visit: Payer: Self-pay | Admitting: Internal Medicine

## 2021-11-07 ENCOUNTER — Other Ambulatory Visit: Payer: Self-pay | Admitting: Internal Medicine

## 2022-01-09 ENCOUNTER — Other Ambulatory Visit: Payer: Self-pay | Admitting: Internal Medicine

## 2022-01-15 ENCOUNTER — Encounter: Payer: Self-pay | Admitting: Internal Medicine

## 2022-01-15 ENCOUNTER — Ambulatory Visit (INDEPENDENT_AMBULATORY_CARE_PROVIDER_SITE_OTHER): Payer: Medicare Other | Admitting: Internal Medicine

## 2022-01-15 VITALS — BP 106/76 | HR 74 | Temp 98.1°F | Ht 70.0 in | Wt 190.0 lb

## 2022-01-15 DIAGNOSIS — B20 Human immunodeficiency virus [HIV] disease: Secondary | ICD-10-CM

## 2022-01-15 DIAGNOSIS — Z0000 Encounter for general adult medical examination without abnormal findings: Secondary | ICD-10-CM

## 2022-01-15 DIAGNOSIS — I25119 Atherosclerotic heart disease of native coronary artery with unspecified angina pectoris: Secondary | ICD-10-CM

## 2022-01-15 DIAGNOSIS — Z125 Encounter for screening for malignant neoplasm of prostate: Secondary | ICD-10-CM | POA: Diagnosis not present

## 2022-01-15 DIAGNOSIS — N1832 Chronic kidney disease, stage 3b: Secondary | ICD-10-CM | POA: Diagnosis not present

## 2022-01-15 DIAGNOSIS — I5022 Chronic systolic (congestive) heart failure: Secondary | ICD-10-CM

## 2022-01-15 DIAGNOSIS — F39 Unspecified mood [affective] disorder: Secondary | ICD-10-CM

## 2022-01-15 NOTE — Patient Instructions (Signed)
Please try over the counter diclofenac gel on your painful thumbs.

## 2022-01-15 NOTE — Assessment & Plan Note (Addendum)
Seems compensated on torsemide 20 daily, spironolactone 25 daily, entresto 97/103 bid, carvedilol 6.25 bid Will check BNP with ongoing DOE Going to cardiologist

## 2022-01-15 NOTE — Assessment & Plan Note (Signed)
Mostly anxiety and sleep problems Just using the trazodone for sleep

## 2022-01-15 NOTE — Assessment & Plan Note (Signed)
I have personally reviewed the Medicare Annual Wellness questionnaire and have noted 1. The patient's medical and social history 2. Their use of alcohol, tobacco or illicit drugs 3. Their current medications and supplements 4. The patient's functional ability including ADL's, fall risks, home safety risks and hearing or visual             impairment. 5. Diet and physical activities 6. Evidence for depression or mood disorders  The patients weight, height, BMI and visual acuity have been recorded in the chart I have made referrals, counseling and provided education to the patient based review of the above and I have provided the pt with a written personalized care plan for preventive services.  I have provided you with a copy of your personalized plan for preventive services. Please take the time to review along with your updated medication list.  Recent colonoscopy--due again 2028 Will check one last PSA Does exercise regularly Recent bivalent COVID--will still likely take new one Flu vaccine in fall

## 2022-01-15 NOTE — Progress Notes (Signed)
Hearing Screening - Comments:: Passed whisper test Vision Screening - Comments:: April 2023  

## 2022-01-15 NOTE — Assessment & Plan Note (Signed)
Remission on the biktarvy

## 2022-01-15 NOTE — Assessment & Plan Note (Signed)
Stable GFR  Will check labs

## 2022-01-15 NOTE — Progress Notes (Signed)
Subjective:    Patient ID: Dale Mcdonald, male    DOB: Jan 01, 1951, 71 y.o.   MRN: [id number]  HPI Here for Medicare wellness visit and follow up of chronic health conditions Reviewed advanced directives Reviewed other doctors---Dr VanDam--infectious disease, Dr Delrae Rend, Dr Myer Haff, Dr [place] Sink, Dr George Ina No hospitalizations or surgery this year Vision is okay Hearing is fine [facility] or [facility] exercise---bike/treadmill Still vapes---trying to cut back Rare beer 1 fall--no sig injury Is concerned about his health---but not really depressed or anhedonic Independent with instrumental ADLs No sig memory issues  Having some shortness of breath---any activity "under stress" gets "huffing and puffing" Resolves in a few minutes Goes back a few months No chest pain Weight stable--checks every morning Does adjust torsemide No palpitations of note---gets occ racing feeling Did fall once last week---after awakening at 2:30AM with heartburn. Woke on floor  HIV and hepatitis B infections still in remission  Mild mood issues---worrying, etc Sleeps okay with trazodone-- usually just 50 at bedtime  Continues on pantoprazole---occ heartburn Bread and tomatoes seem to get him to flare No dysphagia  Last GFR stable at 45  Current Outpatient Medications on File Prior to Visit  Medication Sig Dispense Refill   atorvastatin (LIPITOR) 20 MG tablet TAKE 1 TABLET BY MOUTH ONCE  DAILY 90 tablet 0   bictegravir-emtricitabine-tenofovir AF (BIKTARVY) 50-200-25 MG TABS tablet Take 1 tablet by mouth daily. 30 tablet 11   carvedilol (COREG) 6.25 MG tablet Take 6.25 mg by mouth 2 (two) times daily with a meal.     dapagliflozin propanediol (FARXIGA) 10 MG TABS tablet Take 5 mg by mouth daily.     fluconazole (DIFLUCAN) 150 MG tablet Take 150 mg by mouth once a week. prn     KLOR-CON M20 20 MEQ tablet Take 20 mEq by mouth daily as needed.     Melatonin 5 MG TABS Take 5 mg  by mouth at bedtime.      Multiple Vitamin (MULTI-VITAMIN) tablet Take by mouth.     pantoprazole (PROTONIX) 40 MG tablet TAKE 1 TABLET BY MOUTH  DAILY 90 tablet 3   sacubitril-valsartan (ENTRESTO) 97-103 MG Take 1 tablet by mouth every 12 (twelve) hours.     spironolactone (ALDACTONE) 25 MG tablet Take 25 mg by mouth daily.     torsemide (DEMADEX) 20 MG tablet 10 mg. 1-2 tabs daily as needed     traZODone (DESYREL) 50 MG tablet TAKE 1-2 TABLETS (50-100 MG TOTAL) BY MOUTH AT BEDTIME. 180 tablet 3   valACYclovir (VALTREX) 500 MG tablet Take 1 tablet (500 mg total) by mouth daily as needed (for fever blisters.). 30 tablet 5   No current facility-administered medications on file prior to visit.    Allergies  Allergen Reactions   Magnesium Nausea Only    Past Medical History:  Diagnosis Date   Anemia 04/07/2021   Anxiety 03/29/2017   Cirrhosis ([place])    Heart murmur    Hepatitis    B   HIV (human immunodeficiency virus infection) ([place])    Hx of adenomatous polyp of colon 02/08/2018   Hyperglycemia 04/17/2020   Hyperlipidemia    Hypertension    NAFLD (nonalcoholic fatty liver disease) 07/08/2021   Cause likely abdominal obesity/metabolic syndrome plus Biktarvy Hepatic elastography with high likelihood normal i.e. no cirrhosis 2022   Orthostatic dizziness 04/07/2021   Palpitations     Past Surgical History:  Procedure Laterality Date   CATARACT EXTRACTION [facility] Left 11/11/2017  Procedure: CATARACT EXTRACTION PHACO AND INTRAOCULAR LENS PLACEMENT (IOC);  Surgeon: Birder Robson, MD;  Location: [facility] ORS;  Service: Ophthalmology;  Laterality: Left;  [place] 00:59.3 AP% 19.5 CDE 11.54 Fluid pack lot # [id number]   CATARACT EXTRACTION W/PHACO Right 12/21/2017   Procedure: CATARACT EXTRACTION PHACO AND INTRAOCULAR LENS PLACEMENT (IOC);  Surgeon: Birder Robson, MD;  Location: [facility] ORS;  Service: Ophthalmology;  Laterality: Right;  [place] 00:40 AP% 13.5 CDE 5.50 Fluid pack lot $ [id number]    COLONOSCOPY     COLONOSCOPY WITH PROPOFOL N/A 02/08/2018   Procedure: COLONOSCOPY WITH PROPOFOL;  Surgeon: Jonathon Bellows, MD;  Location: [facility] ENDOSCOPY;  Service: Gastroenterology;  Laterality: N/A;   COLONOSCOPY WITH PROPOFOL N/A 06/03/2021   Procedure: COLONOSCOPY WITH PROPOFOL;  Surgeon: Lin Landsman, MD;  Location: [facility] ENDOSCOPY;  Service: Gastroenterology;  Laterality: N/A;   CORONARY ARTERY BYPASS GRAFT  04/07/2018   EYE SURGERY     RETINA   HERNIA REPAIR     MITRAL VALVE REPLACEMENT  03/2018   bovine--with CABG    History reviewed. No pertinent family history.  Social History   Socioeconomic History   Marital status: Widowed    Spouse name: Not on file   Number of children: 1   Years of education: Not on file   Highest education level: Not on file  Occupational History   Occupation: VICE PRESIDENT--Purchasing---retired    Comment: [place]  Tobacco Use   Smoking status: Former    Packs/day: 2.00    Years: 40.00    Total pack years: 80.00    Types: Cigarettes, E-cigarettes    Passive exposure: Past   Smokeless tobacco: Never   Tobacco comments:    Quit 05/25/2017/ cutting back  Substance and Sexual Activity   Alcohol use: No    Alcohol/week: 0.0 standard drinks of alcohol   Drug use: No   Sexual activity: Not on file    Comment: declined condoms  Other Topics Concern   Not on file  Social History Narrative   Retired and widowed   1 son   Former smoker   3 caffeinated beverages a day   No alcohol no drug use no smokeless tobacco      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, 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 [employer]      No living will   Would want son [name] to be Media planner   Would accept resuscitation but no prolonged ventilation   Not sure about tube feeds--but definitely not long term   Social Determinants of Health   Financial Resource Strain: Medium Risk (07/16/2021)   Overall Financial  Resource Strain (CARDIA)    Difficulty of Paying Living Expenses: Somewhat hard  Food Insecurity: No Food Insecurity (07/16/2021)   Hunger Vital Sign    Worried About Running Out of Food in the Last Year: Never true    Ran Out of Food in the Last Year: Never true  Transportation Needs: Not on file  Physical Activity: Not on file  Stress: Not on file  Social Connections: Not on file  Intimate Partner Violence: Not on file   Review of Systems Appetite is fine Weight stable Wears seat belt Teeth are okay--new implants Bowels move fine--no blood Voids okay---dribbles Some pain in hands---wonders about carpal tunnel. Points to [facility]----discussed that this is arthritis No skin issues    Objective:   Physical Exam Constitutional:      Appearance: Normal appearance.  HENT:  Mouth/Throat:     Comments: No lesions Eyes:     Conjunctiva/sclera: Conjunctivae normal.     Pupils: Pupils are equal, round, and reactive to light.  Cardiovascular:     Rate and Rhythm: Normal rate and regular rhythm.     Pulses: Normal pulses.     Heart sounds: No murmur heard.    No gallop.  Pulmonary:     Effort: Pulmonary effort is normal.     Breath sounds: Normal breath sounds. No wheezing or rales.  Abdominal:     Palpations: Abdomen is soft.     Tenderness: There is no abdominal tenderness.  Musculoskeletal:     Cervical back: Neck supple.     Right lower leg: No edema.     Left lower leg: No edema.  Lymphadenopathy:     Cervical: No cervical adenopathy.  Skin:    Findings: No lesion or rash.  Neurological:     General: No focal deficit present.     Mental Status: He is alert and oriented to person, place, and time.     Comments: Mini-cog normal  Psychiatric:        Mood and Affect: Mood normal.        Behavior: Behavior normal.            Assessment & Plan:

## 2022-01-15 NOTE — Assessment & Plan Note (Signed)
Seems to have stable angina pattern Cardiology may want to check grafts--but is stable pattern On CHF meds--and farxiga 10

## 2022-01-16 LAB — HEPATIC FUNCTION PANEL
ALT: 50 U/L (ref 0–53)
AST: 33 U/L (ref 0–37)
Albumin: 4.6 g/dL (ref 3.5–5.2)
Alkaline Phosphatase: 113 U/L (ref 39–117)
Bilirubin, Direct: 0.1 mg/dL (ref 0.0–0.3)
Total Bilirubin: 0.6 mg/dL (ref 0.2–1.2)
Total Protein: 7.5 g/dL (ref 6.0–8.3)

## 2022-01-16 LAB — CBC
HCT: 41.3 % (ref 39.0–52.0)
Hemoglobin: 14.1 g/dL (ref 13.0–17.0)
MCHC: 34 g/dL (ref 30.0–36.0)
MCV: 96.2 fl (ref 78.0–100.0)
Platelets: 195 10*3/uL (ref 150.0–400.0)
RBC: 4.3 Mil/uL (ref 4.22–5.81)
RDW: 14.2 % (ref 11.5–15.5)
WBC: 6.3 10*3/uL (ref 4.0–10.5)

## 2022-01-16 LAB — LIPID PANEL
Cholesterol: 164 mg/dL (ref 0–200)
HDL: 47.4 mg/dL (ref >39.00)
LDL Cholesterol: 90 mg/dL (ref 0–99)
NonHDL: 116.36
Total CHOL/HDL Ratio: 3
Triglycerides: 132 mg/dL (ref 0.0–149.0)
VLDL: 26.4 mg/dL (ref 0.0–40.0)

## 2022-01-16 LAB — VITAMIN D 25 HYDROXY (VIT D DEFICIENCY, FRACTURES): VITD: 39.85 ng/mL (ref 30.00–100.00)

## 2022-01-16 LAB — RENAL FUNCTION PANEL
Albumin: 4.6 g/dL (ref 3.5–5.2)
BUN: 37 mg/dL — ABNORMAL HIGH (ref 6–23)
CO2: 30 mEq/L (ref 19–32)
Calcium: 9.9 mg/dL (ref 8.4–10.5)
Chloride: 98 mEq/L (ref 96–112)
Creatinine, Ser: 2.15 mg/dL — ABNORMAL HIGH (ref 0.40–1.50)
GFR: 30.44 mL/min — ABNORMAL LOW (ref >60.00)
Glucose, Bld: 56 mg/dL — ABNORMAL LOW (ref 70–99)
Phosphorus: 4.7 mg/dL — ABNORMAL HIGH (ref 2.3–4.6)
Potassium: 4.3 mEq/L (ref 3.5–5.1)
Sodium: 135 mEq/L (ref 135–145)

## 2022-01-16 LAB — PARATHYROID HORMONE, INTACT (NO CA): PTH: 41 pg/mL (ref 16–77)

## 2022-01-16 LAB — PSA, MEDICARE: PSA: 0.57 ng/ml (ref 0.10–4.00)

## 2022-01-16 LAB — BRAIN NATRIURETIC PEPTIDE: Pro B Natriuretic peptide (BNP): 52 pg/mL (ref 0.0–100.0)

## 2022-01-18 NOTE — Progress Notes (Signed)
Okay--that sounds reasonable

## 2022-01-21 DIAGNOSIS — N179 Acute kidney failure, unspecified: Secondary | ICD-10-CM | POA: Diagnosis not present

## 2022-01-21 DIAGNOSIS — I5022 Chronic systolic (congestive) heart failure: Secondary | ICD-10-CM | POA: Diagnosis not present

## 2022-02-04 DIAGNOSIS — N179 Acute kidney failure, unspecified: Secondary | ICD-10-CM | POA: Diagnosis not present

## 2022-02-05 IMAGING — US US ABDOMEN LIMITED
1 series · 14 of 25 positions shown · non-contrast
Comparison: August 12, 2020

CLINICAL DATA: Nonalcoholic fatty liver disease

EXAM:
ULTRASOUND ABDOMEN LIMITED RIGHT UPPER QUADRANT

[Series 1: us abdomen limited · 0.26mm/px · 14 of 53 slices shown]
[im 1/53]
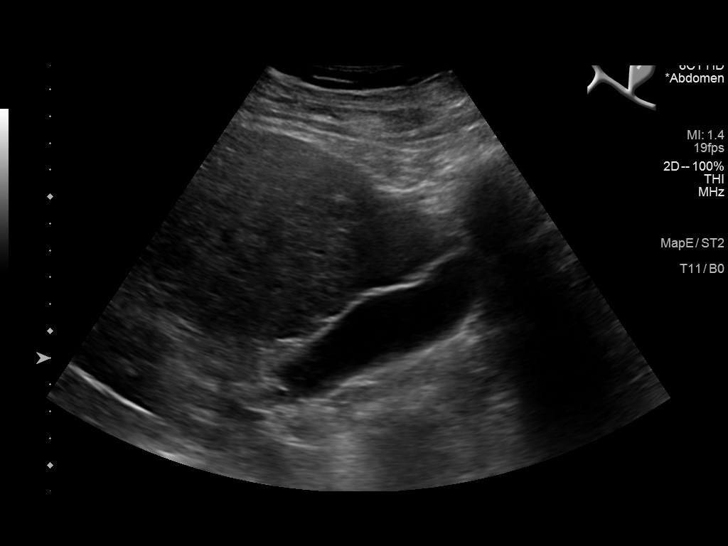
[im 5/53]
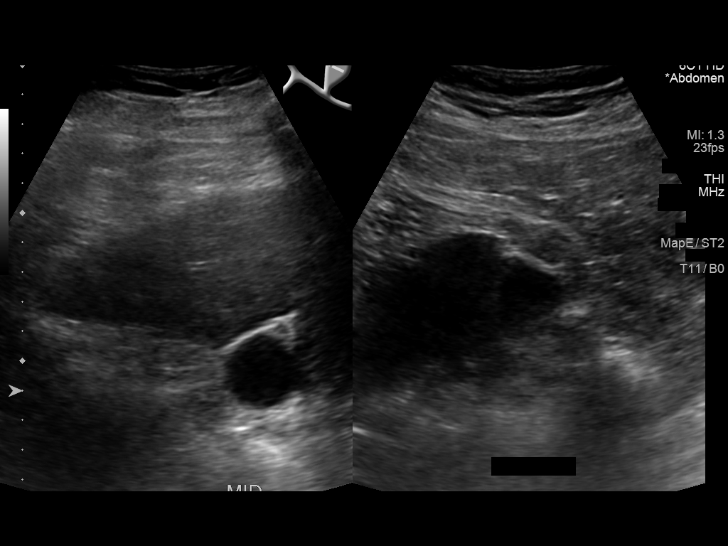
[im 9/53]
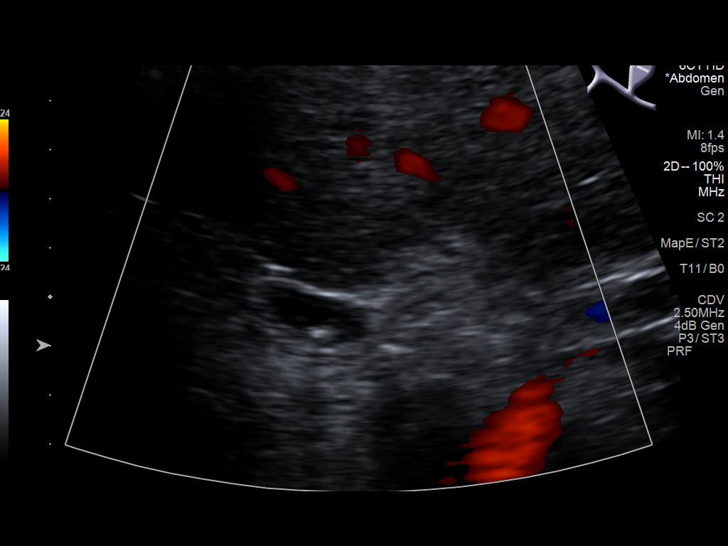
[im 14/53]
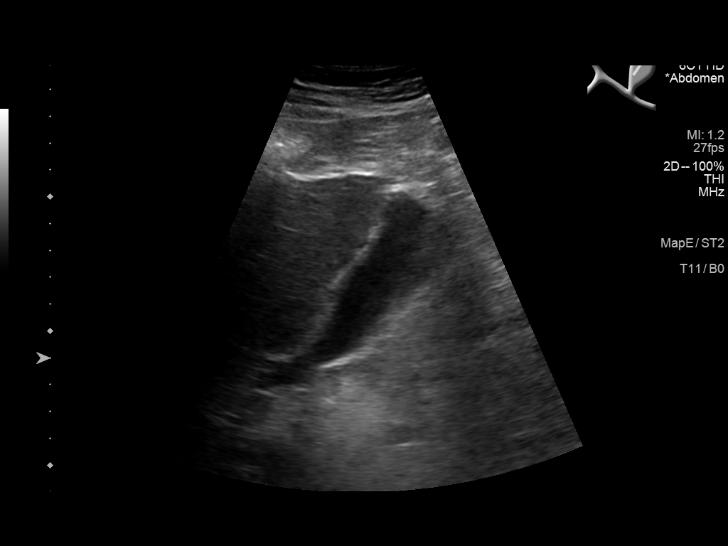
[im 18/53]
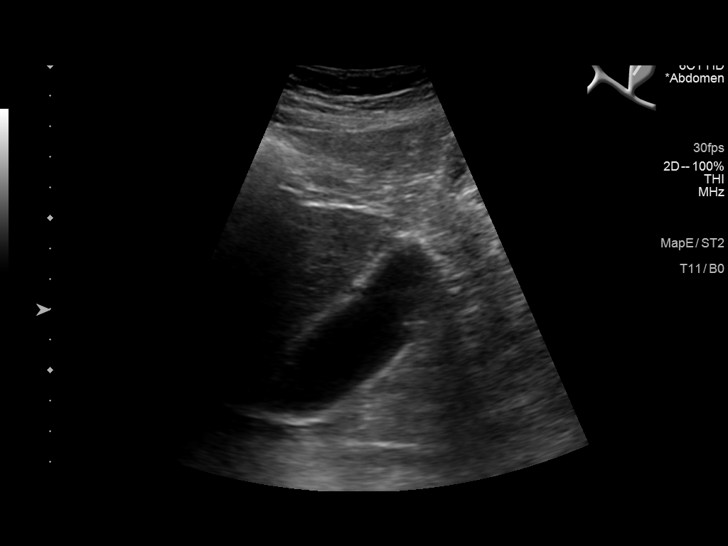
[im 20/53]
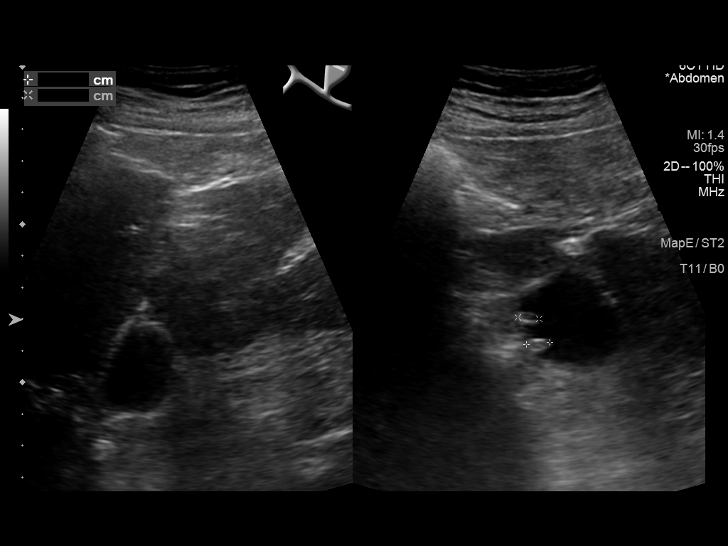
[im 24/53]
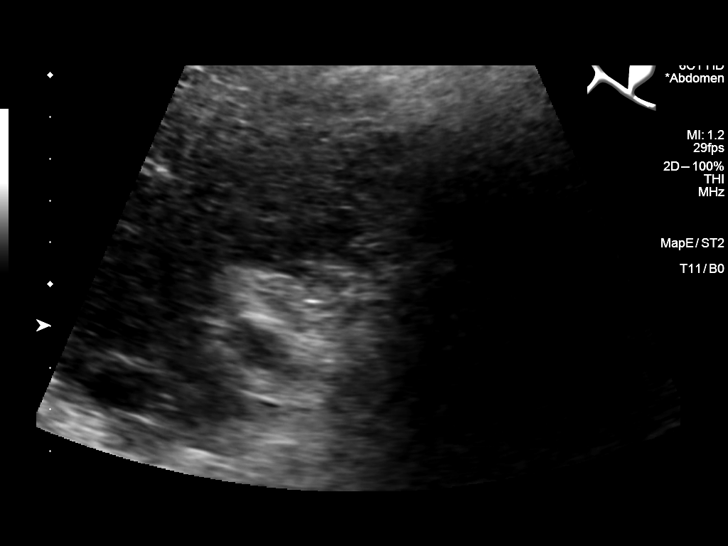
[im 29/53]
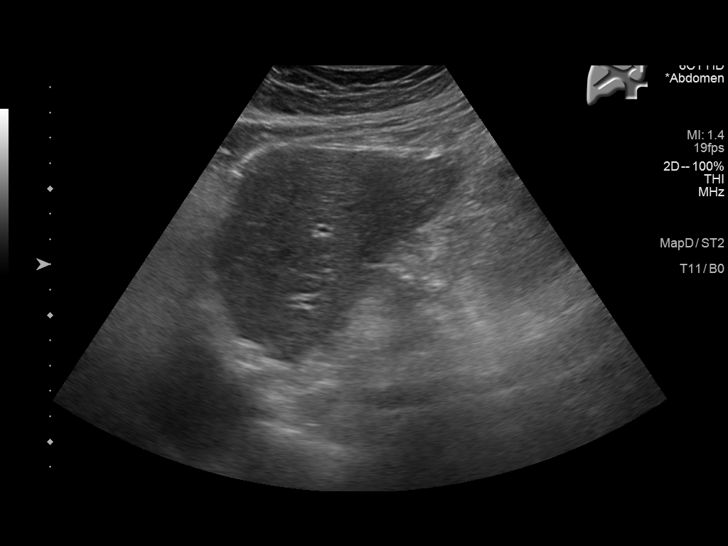
[im 33/53]
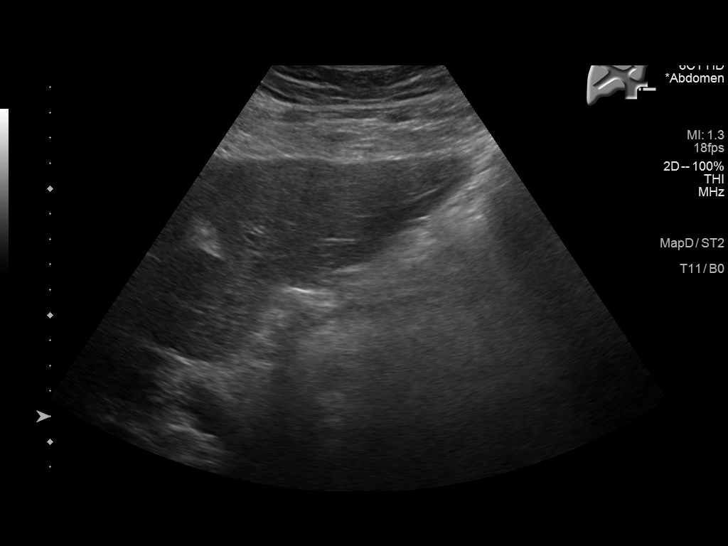
[im 35/53]
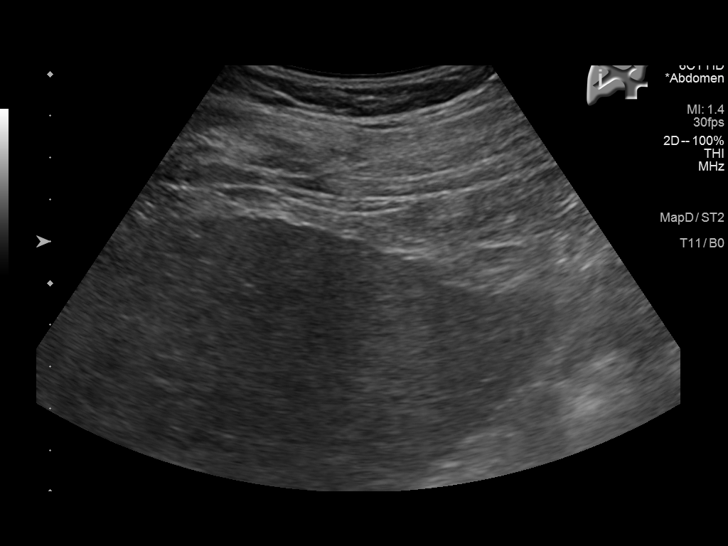
[im 40/53]
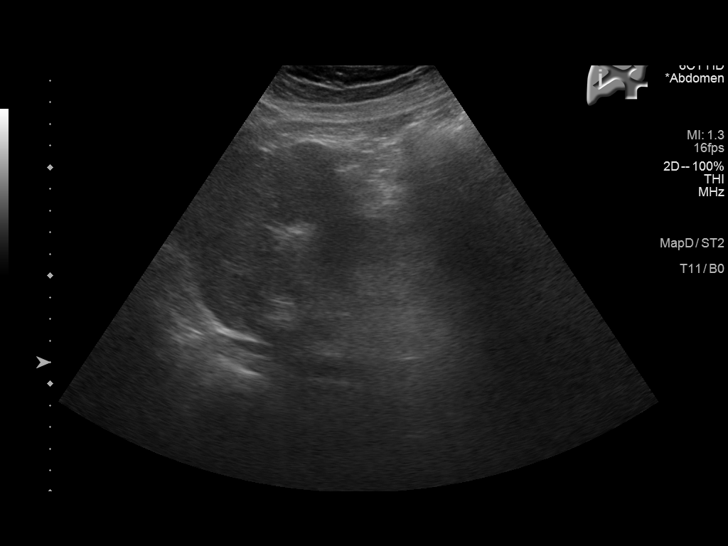
[im 44/53]
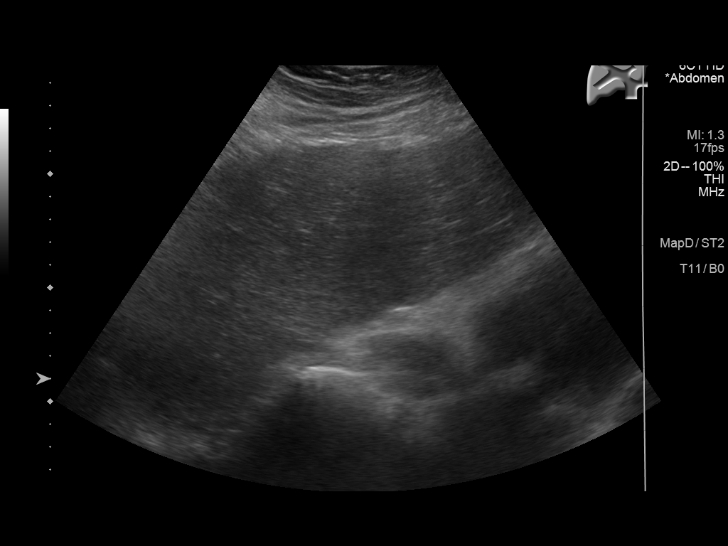
[im 48/53]
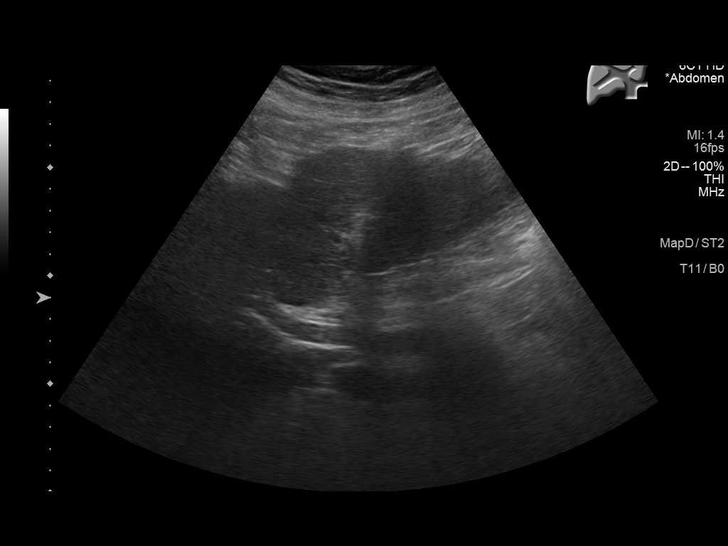
[im 53/53]
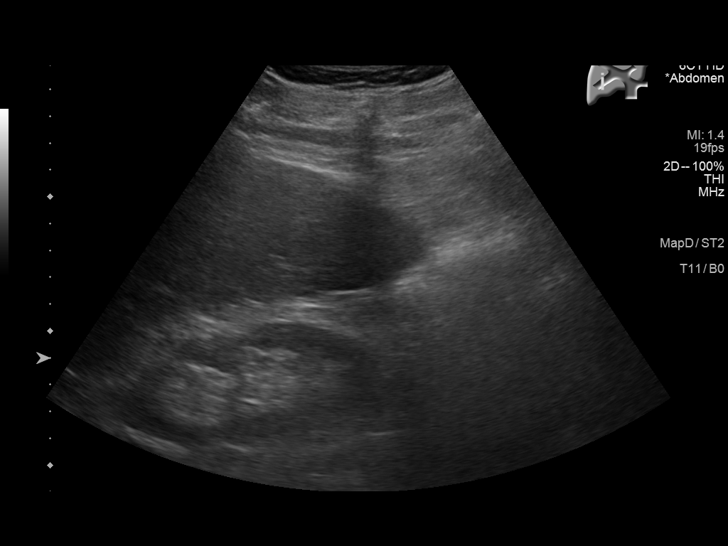

[14 of 25 positions shown; findings below may reference images not displayed]

FINDINGS: Gallbladder:

Gallstones are noted the gallbladder. No wall thickening visualized.
No sonographic Murphy sign noted by sonographer.

Common bile duct:

Diameter: 4.1 mm

Liver:

No focal lesion identified. There is diffuse increased echotexture
of the liver. Nodular contour of liver is noted. Portal vein is
patent on color Doppler imaging with normal direction of blood flow
towards the liver.

Other: None.
IMPRESSION: Cholelithiasis without sonographic evidence of acute cholecystitis.

Diffuse increased echotexture with mild nodular contour of the
liver. The findings can be seen in cirrhosis of liver.

## 2022-02-17 DIAGNOSIS — Z951 Presence of aortocoronary bypass graft: Secondary | ICD-10-CM | POA: Diagnosis not present

## 2022-02-17 DIAGNOSIS — R0602 Shortness of breath: Secondary | ICD-10-CM | POA: Diagnosis not present

## 2022-02-17 DIAGNOSIS — I5022 Chronic systolic (congestive) heart failure: Secondary | ICD-10-CM | POA: Diagnosis not present

## 2022-03-03 DIAGNOSIS — I517 Cardiomegaly: Secondary | ICD-10-CM | POA: Diagnosis not present

## 2022-03-03 DIAGNOSIS — E785 Hyperlipidemia, unspecified: Secondary | ICD-10-CM | POA: Diagnosis not present

## 2022-03-03 DIAGNOSIS — R0602 Shortness of breath: Secondary | ICD-10-CM | POA: Diagnosis not present

## 2022-03-03 DIAGNOSIS — I2510 Atherosclerotic heart disease of native coronary artery without angina pectoris: Secondary | ICD-10-CM | POA: Diagnosis not present

## 2022-03-03 DIAGNOSIS — I10 Essential (primary) hypertension: Secondary | ICD-10-CM | POA: Diagnosis not present

## 2022-03-03 DIAGNOSIS — I480 Paroxysmal atrial fibrillation: Secondary | ICD-10-CM | POA: Diagnosis not present

## 2022-03-03 DIAGNOSIS — I25810 Atherosclerosis of coronary artery bypass graft(s) without angina pectoris: Secondary | ICD-10-CM | POA: Diagnosis not present

## 2022-03-03 DIAGNOSIS — I272 Pulmonary hypertension, unspecified: Secondary | ICD-10-CM | POA: Diagnosis not present

## 2022-03-03 DIAGNOSIS — I110 Hypertensive heart disease with heart failure: Secondary | ICD-10-CM | POA: Diagnosis not present

## 2022-03-03 DIAGNOSIS — Z951 Presence of aortocoronary bypass graft: Secondary | ICD-10-CM | POA: Diagnosis not present

## 2022-03-03 DIAGNOSIS — I255 Ischemic cardiomyopathy: Secondary | ICD-10-CM | POA: Diagnosis not present

## 2022-03-03 DIAGNOSIS — I5022 Chronic systolic (congestive) heart failure: Secondary | ICD-10-CM | POA: Diagnosis not present

## 2022-03-17 DIAGNOSIS — Z951 Presence of aortocoronary bypass graft: Secondary | ICD-10-CM | POA: Diagnosis not present

## 2022-03-17 DIAGNOSIS — R0602 Shortness of breath: Secondary | ICD-10-CM | POA: Diagnosis not present

## 2022-03-17 DIAGNOSIS — Z952 Presence of prosthetic heart valve: Secondary | ICD-10-CM | POA: Diagnosis not present

## 2022-03-17 DIAGNOSIS — J439 Emphysema, unspecified: Secondary | ICD-10-CM | POA: Diagnosis not present

## 2022-03-25 ENCOUNTER — Other Ambulatory Visit: Payer: Medicare Other

## 2022-03-25 DIAGNOSIS — R0602 Shortness of breath: Secondary | ICD-10-CM | POA: Diagnosis not present

## 2022-03-26 ENCOUNTER — Other Ambulatory Visit: Payer: Medicare Other

## 2022-03-26 ENCOUNTER — Other Ambulatory Visit: Payer: Self-pay

## 2022-03-26 DIAGNOSIS — B20 Human immunodeficiency virus [HIV] disease: Secondary | ICD-10-CM

## 2022-03-26 DIAGNOSIS — Z79899 Other long term (current) drug therapy: Secondary | ICD-10-CM | POA: Diagnosis not present

## 2022-03-27 LAB — T-HELPER CELL (CD4) - (RCID CLINIC ONLY)
CD4 % Helper T Cell: 33 % (ref 33–65)
CD4 T Cell Abs: 599 /uL (ref 400–1790)

## 2022-03-28 LAB — CBC WITH DIFFERENTIAL/PLATELET
Absolute Monocytes: 589 cells/uL (ref 200–950)
Basophils Absolute: 32 cells/uL (ref 0–200)
Basophils Relative: 0.6 %
Eosinophils Absolute: 211 cells/uL (ref 15–500)
Eosinophils Relative: 3.9 %
HCT: 41.4 % (ref 38.5–50.0)
Hemoglobin: 14 g/dL (ref 13.2–17.1)
Lymphs Abs: 1901 cells/uL (ref 850–3900)
MCH: 33.3 pg — ABNORMAL HIGH (ref 27.0–33.0)
MCHC: 33.8 g/dL (ref 32.0–36.0)
MCV: 98.3 fL (ref 80.0–100.0)
MPV: 10 fL (ref 7.5–12.5)
Monocytes Relative: 10.9 %
Neutro Abs: 2668 cells/uL (ref 1500–7800)
Neutrophils Relative %: 49.4 %
Platelets: 187 10*3/uL (ref 140–400)
RBC: 4.21 10*6/uL (ref 4.20–5.80)
RDW: 13.2 % (ref 11.0–15.0)
Total Lymphocyte: 35.2 %
WBC: 5.4 10*3/uL (ref 3.8–10.8)

## 2022-03-28 LAB — COMPLETE METABOLIC PANEL WITH GFR
AG Ratio: 1.7 (calc) (ref 1.0–2.5)
ALT: 35 U/L (ref 9–46)
AST: 27 U/L (ref 10–35)
Albumin: 4.4 g/dL (ref 3.6–5.1)
Alkaline phosphatase (APISO): 107 U/L (ref 35–144)
BUN/Creatinine Ratio: 13 (calc) (ref 6–22)
BUN: 19 mg/dL (ref 7–25)
CO2: 25 mmol/L (ref 20–32)
Calcium: 9.5 mg/dL (ref 8.6–10.3)
Chloride: 103 mmol/L (ref 98–110)
Creat: 1.42 mg/dL — ABNORMAL HIGH (ref 0.70–1.28)
Globulin: 2.6 g/dL (calc) (ref 1.9–3.7)
Glucose, Bld: 112 mg/dL — ABNORMAL HIGH (ref 65–99)
Potassium: 4.6 mmol/L (ref 3.5–5.3)
Sodium: 136 mmol/L (ref 135–146)
Total Bilirubin: 0.6 mg/dL (ref 0.2–1.2)
Total Protein: 7 g/dL (ref 6.1–8.1)
eGFR: 53 mL/min/{1.73_m2} — ABNORMAL LOW (ref >=60)

## 2022-03-28 LAB — HIV-1 RNA QUANT-NO REFLEX-BLD
HIV 1 RNA Quant: <20 Copies/mL — ABNORMAL HIGH
HIV-1 RNA Quant, Log: <1.3 Log cps/mL — ABNORMAL HIGH

## 2022-03-28 LAB — LIPID PANEL
Cholesterol: 137 mg/dL (ref <200)
HDL: 48 mg/dL (ref >=40)
LDL Cholesterol (Calc): 67 mg/dL (calc)
Non-HDL Cholesterol (Calc): 89 mg/dL (calc) (ref <130)
Total CHOL/HDL Ratio: 2.9 (calc) (ref <5.0)
Triglycerides: 139 mg/dL (ref <150)

## 2022-03-28 LAB — RPR: RPR Ser Ql: NONREACTIVE

## 2022-03-30 ENCOUNTER — Other Ambulatory Visit: Payer: Self-pay | Admitting: Internal Medicine

## 2022-04-08 ENCOUNTER — Telehealth: Payer: Self-pay

## 2022-04-08 ENCOUNTER — Ambulatory Visit: Payer: Medicare Other | Admitting: Infectious Disease

## 2022-04-08 ENCOUNTER — Encounter: Payer: Self-pay | Admitting: Infectious Disease

## 2022-04-08 ENCOUNTER — Other Ambulatory Visit: Payer: Self-pay

## 2022-04-08 VITALS — BP 103/61 | HR 52 | Temp 97.7°F | Ht 70.0 in | Wt 195.0 lb

## 2022-04-08 DIAGNOSIS — B20 Human immunodeficiency virus [HIV] disease: Secondary | ICD-10-CM | POA: Diagnosis not present

## 2022-04-08 DIAGNOSIS — B181 Chronic viral hepatitis B without delta-agent: Secondary | ICD-10-CM

## 2022-04-08 DIAGNOSIS — Z7185 Encounter for immunization safety counseling: Secondary | ICD-10-CM

## 2022-04-08 DIAGNOSIS — I25119 Atherosclerotic heart disease of native coronary artery with unspecified angina pectoris: Secondary | ICD-10-CM | POA: Diagnosis not present

## 2022-04-08 DIAGNOSIS — I5022 Chronic systolic (congestive) heart failure: Secondary | ICD-10-CM | POA: Diagnosis not present

## 2022-04-08 DIAGNOSIS — J449 Chronic obstructive pulmonary disease, unspecified: Secondary | ICD-10-CM | POA: Diagnosis not present

## 2022-04-08 NOTE — Progress Notes (Signed)
    Chronic Care Management Pharmacy Assistant   Name: Dale Mcdonald  MRN: [id number] DOB: 09/14/1950  Reason for Encounter: CCM (Appointment Reminder)  Medications: Outpatient Encounter Medications as of 04/08/2022  Medication Sig   atorvastatin (LIPITOR) 20 MG tablet TAKE 1 TABLET BY MOUTH ONCE  DAILY   bictegravir-emtricitabine-tenofovir AF (BIKTARVY) 50-200-25 MG TABS tablet Take 1 tablet by mouth daily.   carvedilol (COREG) 6.25 MG tablet Take 6.25 mg by mouth 2 (two) times daily with a meal.   dapagliflozin propanediol (FARXIGA) 10 MG TABS tablet Take 5 mg by mouth daily.   fluconazole (DIFLUCAN) 150 MG tablet Take 150 mg by mouth once a week. prn   KLOR-CON M20 20 MEQ tablet Take 20 mEq by mouth daily as needed.   Melatonin 5 MG TABS Take 5 mg by mouth at bedtime.    Multiple Vitamin (MULTI-VITAMIN) tablet Take by mouth.   pantoprazole (PROTONIX) 40 MG tablet TAKE 1 TABLET BY MOUTH  DAILY   spironolactone (ALDACTONE) 25 MG tablet Take 25 mg by mouth daily.   torsemide (DEMADEX) 20 MG tablet 10 mg. 1-2 tabs daily as needed   traZODone (DESYREL) 50 MG tablet TAKE 1-2 TABLETS (50-100 MG TOTAL) BY MOUTH AT BEDTIME.   valACYclovir (VALTREX) 500 MG tablet Take 1 tablet (500 mg total) by mouth daily as needed (for fever blisters.).   No facility-administered encounter medications on file as of 04/08/2022.   [name] was contacted to remind of upcoming telephone visit with Charlene Brooke on 04/13/2022 at 8:45. Patient was reminded to have any blood glucose and blood pressure readings available for review at appointment.   Message was left reminding patient of appointment.  CCM referral has been placed prior to visit?  No   Star Rating Drugs: Medication:  Last Fill: Day Supply Atorvastatin 20 mg 04/07/2022 [place], CPP notified  Marijean Niemann, [facility] Pharmacy Assistant [phone]

## 2022-04-08 NOTE — Progress Notes (Signed)
Subjective:   Chief complaint: Follow-up for HIV disease on medications Patient ID: Dale Mcdonald, male    DOB: [birth year], 71 y.o.   MRN: [id number]  HPI  71 year old male who is doing superbly well on his current regimen of Biktarvy.  He does have hepatitis B coinfection with still positive hepatitis B surface antigen when checked in 2021.  He had screening ultrasound that did not show evidence of any thing concerning for hepatocellular carcinoma he does follow with Dr. Carlean Purl.  He is seeing Dr. Silvio Pate for primary care.  Apparently Dr. Sabino Donovan is retiring and he is seeking another primary care physician he does go to [facility] where he seen cardiology and where they will also perform pulmonary function test.  He has been told he might have COPD based on a chest x-ray that was performed there.  We discussed an upcoming ACT G study involving hepatitis B HIV co-infected patients who still have persistently positive hepatitis B surface antigen.  I think he would be a great candidate for such a study.       Past Medical History:  Diagnosis Date   Anemia 04/07/2021   Anxiety 03/29/2017   Cirrhosis ([place])    Heart murmur    Hepatitis    B   HIV (human immunodeficiency virus infection) ([place])    Hx of adenomatous polyp of colon 02/08/2018   Hyperglycemia 04/17/2020   Hyperlipidemia    Hypertension    NAFLD (nonalcoholic fatty liver disease) 07/08/2021   Cause likely abdominal obesity/metabolic syndrome plus Biktarvy Hepatic elastography with high likelihood normal i.e. no cirrhosis 2022   Orthostatic dizziness 04/07/2021   Palpitations     Past Surgical History:  Procedure Laterality Date   CATARACT EXTRACTION W/PHACO Left 11/11/2017   Procedure: CATARACT EXTRACTION PHACO AND INTRAOCULAR LENS PLACEMENT ([place]);  Surgeon: Birder Robson, MD;  Location: [facility] ORS;  Service: Ophthalmology;  Laterality: Left;  [place] 00:59.3 AP% 19.5 CDE 11.54 Fluid pack lot # [id number]   CATARACT  EXTRACTION W/PHACO Right 12/21/2017   Procedure: CATARACT EXTRACTION PHACO AND INTRAOCULAR LENS PLACEMENT (IOC);  Surgeon: Birder Robson, MD;  Location: [facility] ORS;  Service: Ophthalmology;  Laterality: Right;  [place] 00:40 AP% 13.5 CDE 5.50 Fluid pack lot $ [id number]   COLONOSCOPY     COLONOSCOPY WITH PROPOFOL N/A 02/08/2018   Procedure: COLONOSCOPY WITH PROPOFOL;  Surgeon: Jonathon Bellows, MD;  Location: [facility] ENDOSCOPY;  Service: Gastroenterology;  Laterality: N/A;   COLONOSCOPY WITH PROPOFOL N/A 06/03/2021   Procedure: COLONOSCOPY WITH PROPOFOL;  Surgeon: Lin Landsman, MD;  Location: [facility] ENDOSCOPY;  Service: Gastroenterology;  Laterality: N/A;   CORONARY ARTERY BYPASS GRAFT  04/07/2018   EYE SURGERY     RETINA   HERNIA REPAIR     MITRAL VALVE REPLACEMENT  03/2018   bovine--with CABG    No family history on file.    Social History   Socioeconomic History   Marital status: Widowed    Spouse name: Not on file   Number of children: 1   Years of education: Not on file   Highest education level: Not on file  Occupational History   Occupation: VICE PRESIDENT--Purchasing---retired    Comment: [place]  Tobacco Use   Smoking status: Some Days    Packs/day: 2.00    Years: 40.00    Total pack years: 80.00    Types: Cigarettes, E-cigarettes    Passive exposure: Past   Smokeless tobacco: Never   Tobacco comments:  Quit 05/25/2017/     States vapes occasionally (04/08/22)  Substance and Sexual Activity   Alcohol use: Yes    Comment: States rare - a beer a month   Drug use: No   Sexual activity: Not on file    Comment: declined condoms  Other Topics Concern   Not on file  Social History Narrative   Retired and widowed   1 son   Former smoker   3 caffeinated beverages a day   No alcohol no drug use no smokeless tobacco      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, 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 [employer]      No living  will   Would want son [name] to be Media planner   Would accept resuscitation but no prolonged ventilation   Not sure about tube feeds--but definitely not long term   Social Determinants of Health   Financial Resource Strain: Medium Risk (07/16/2021)   Overall Financial Resource Strain (CARDIA)    Difficulty of Paying Living Expenses: Somewhat hard  Food Insecurity: No Food Insecurity (07/16/2021)   Hunger Vital Sign    Worried About Running Out of Food in the Last Year: Never true    Ran Out of Food in the Last Year: Never true  Transportation Needs: Not on file  Physical Activity: Not on file  Stress: Not on file  Social Connections: Not on file    Allergies  Allergen Reactions   Magnesium Nausea Only     Current Outpatient Medications:    atorvastatin (LIPITOR) 20 MG tablet, TAKE 1 TABLET BY MOUTH ONCE  DAILY, Disp: 90 tablet, Rfl: 3   bictegravir-emtricitabine-tenofovir AF (BIKTARVY) 50-200-25 MG TABS tablet, Take 1 tablet by mouth daily., Disp: 30 tablet, Rfl: 11   carvedilol (COREG) 6.25 MG tablet, Take 6.25 mg by mouth 2 (two) times daily with a meal., Disp: , Rfl:    dapagliflozin propanediol (FARXIGA) 10 MG TABS tablet, Take 5 mg by mouth daily., Disp: , Rfl:    fluconazole (DIFLUCAN) 150 MG tablet, Take 150 mg by mouth once a week. prn, Disp: , Rfl:    KLOR-CON M20 20 MEQ tablet, Take 20 mEq by mouth daily as needed., Disp: , Rfl:    Melatonin 5 MG TABS, Take 5 mg by mouth at bedtime. , Disp: , Rfl:    Multiple Vitamin (MULTI-VITAMIN) tablet, Take by mouth., Disp: , Rfl:    pantoprazole (PROTONIX) 40 MG tablet, TAKE 1 TABLET BY MOUTH  DAILY, Disp: 90 tablet, Rfl: 3   spironolactone (ALDACTONE) 25 MG tablet, Take 25 mg by mouth daily., Disp: , Rfl:    torsemide (DEMADEX) 20 MG tablet, 10 mg. 1-2 tabs daily as needed, Disp: , Rfl:    traZODone (DESYREL) 50 MG tablet, TAKE 1-2 TABLETS (50-100 MG TOTAL) BY MOUTH AT BEDTIME., Disp: 180 tablet, Rfl: 3   valACYclovir (VALTREX)  500 MG tablet, Take 1 tablet (500 mg total) by mouth daily as needed (for fever blisters.)., Disp: 30 tablet, Rfl: 5     Review of Systems  Constitutional:  Negative for activity change, appetite change, chills, diaphoresis, fatigue, fever and unexpected weight change.  HENT:  Negative for congestion, rhinorrhea, sinus pressure, sneezing, sore throat and trouble swallowing.   Eyes:  Negative for photophobia and visual disturbance.  Respiratory:  Negative for cough, chest tightness, shortness of breath, wheezing and stridor.   Cardiovascular:  Negative for chest pain, palpitations and leg swelling.  Gastrointestinal:  Negative for abdominal distention, abdominal pain, anal bleeding, blood in stool, constipation, diarrhea, nausea and vomiting.  Genitourinary:  Negative for difficulty urinating, dysuria, flank pain and hematuria.  Musculoskeletal:  Negative for arthralgias, back pain, gait problem, joint swelling and myalgias.  Skin:  Negative for color change, pallor, rash and wound.  Neurological:  Negative for dizziness, tremors, weakness and light-headedness.  Hematological:  Negative for adenopathy. Does not bruise/bleed easily.  Psychiatric/Behavioral:  Negative for agitation, behavioral problems, confusion, decreased concentration, dysphoric mood and sleep disturbance.        Objective:   Physical Exam Constitutional:      Appearance: He is well-developed.  HENT:     Head: Normocephalic and atraumatic.  Eyes:     Extraocular Movements: Extraocular movements intact.     Conjunctiva/sclera: Conjunctivae normal.  Cardiovascular:     Rate and Rhythm: Normal rate and regular rhythm.  Pulmonary:     Effort: Pulmonary effort is normal. No respiratory distress.     Breath sounds: No wheezing.  Abdominal:     General: There is no distension.     Palpations: Abdomen is soft.  Musculoskeletal:        General: Normal range of motion.     Cervical back: Normal range of motion and neck  supple.  Skin:    General: Skin is warm and dry.     Coloration: Skin is not pale.     Findings: No erythema or rash.  Neurological:     General: No focal deficit present.     Mental Status: He is alert and oriented to person, place, and time.  Psychiatric:        Mood and Affect: Mood normal.        Behavior: Behavior normal.        Thought Content: Thought content normal.        Judgment: Judgment normal.           Assessment & Plan:   HIV disease:  I have reviewed Vang R Crockett's labs including viral load which was  Lab Results  Component Value Date   HIV1RNAQUANT <20 (H) 03/26/2022   and cd4 which was  Lab Results  Component Value Date   CD4TABS 599 03/26/2022     I am continuing patient's prescription for [facility] Lab Results  Component Value Date   CD4TABS 599 03/26/2022   CD4TABS 667 03/24/2021   CD4TABS 728 04/02/2020    Chronic hepatitis B without hepatic coma:  I will check a repeat hepatitis B surface antigen and hepatitis B DNA.  He is quite interested in the ACT G study.  COPD: [facility] with [facility]  CV diseasel lipidemia, continues on carvedilol Farxiga atorvastatin spironolactone  Vaccine counseling he is up-to-date on COVID 19 and flu shot I recommended RSV vaccine.  I spent 42 minutes with the patient including than 50% of the time in face to face counseling of the patient re his HIV, Hepatitis B, potential ACTG study along with review of medical records in preparation for the visit and during the visit and in coordination of his care.

## 2022-04-09 DIAGNOSIS — R0602 Shortness of breath: Secondary | ICD-10-CM | POA: Diagnosis not present

## 2022-04-11 LAB — HEPATITIS B DNA, ULTRAQUANTITATIVE, PCR
Hepatitis B DNA: <10 IU/mL — ABNORMAL HIGH
Hepatitis B virus DNA: <1 Log IU/mL — ABNORMAL HIGH

## 2022-04-11 LAB — HEPATITIS B SURFACE ANTIGEN: Hepatitis B Surface Ag: REACTIVE — AB

## 2022-04-13 ENCOUNTER — Ambulatory Visit: Payer: Medicare Other | Admitting: Pharmacist

## 2022-04-13 DIAGNOSIS — I25119 Atherosclerotic heart disease of native coronary artery with unspecified angina pectoris: Secondary | ICD-10-CM

## 2022-04-13 DIAGNOSIS — E785 Hyperlipidemia, unspecified: Secondary | ICD-10-CM

## 2022-04-13 DIAGNOSIS — I5022 Chronic systolic (congestive) heart failure: Secondary | ICD-10-CM

## 2022-04-13 DIAGNOSIS — I10 Essential (primary) hypertension: Secondary | ICD-10-CM

## 2022-04-13 NOTE — Progress Notes (Signed)
Chronic Care Management Pharmacy Note  04/20/2022 Name:  Dale Mcdonald MRN:  [id number] DOB:  Mar 19, 1951  Summary: CCM Initial visit -Reviewed medications; pt affirms compliance -Pt reports new "emphysema" diagnosis per cardiology (PFTs earlier this month), he has pulmonology appt upcoming to establish care; he has not started any inhalers yet - discussed which options are available (LAMA, LAMA/LABA, LABA/ICS) and he would like to avoid ICS  Recommendations/Changes made from today's visit: -No med changes -Advised pt to contact PharmD if he is prescribed an inhaler and needs help with cost  Plan: -[place] will call patient 1 month re: COPD update -Pharmacist follow up televisit scheduled for 6 months -PCP AWV 01/18/23   Subjective: Dale Mcdonald is an 71 y.o. year old male who is a primary patient of [name], MD.  The CCM team was consulted for assistance with disease management and care coordination needs.    Engaged with patient by telephone for follow up visit in response to provider referral for pharmacy case management and/or care coordination services.   Consent to Services:  The patient was given information about Chronic Care Management services, agreed to services, and gave verbal consent prior to initiation of services.  Please see initial visit note for detailed documentation.   Patient Care Team: [name], MD as PCP - General Tommy Medal, Lavell Islam, MD as PCP - Infectious Diseases (Infectious Diseases) Charlton Haws, [facility] as Pharmacist (Pharmacist)  Recent office visits: 01/15/22 Dr Silvio Pate OV: annual - consider nephrologist consult. Pt will speak with cardiologist.  05/02/2021 - [name], MD - Patient presented for anemia. Repeat CBC - anemia has normalized. Stop due to patient preference: aspirin EC 81 MG tablet.  Recent consult visits: 04/09/22 Dr Mosetta Pigeon (Cardiology) VV: referral to pulmonary.  04/08/22 Dr Tommy Medal  (ID): f/u HIV, Hep B. Rec RSV vaccine. Continue Biktarvy.  03/03/22 heart cath 02/17/22 Dr Mosetta Pigeon (Cardiology): continued DOE. Repeat cath.  01/21/22 Dr Mosetta Pigeon (Cardiology): f/u HF. Decrease torsemide to 10 mg QOD. Repeat BMP 2 weeks.   10/01/21 Dr Carlean Purl (GI): chet wall pain - msk origin, resolved. No changes.  07/17/21 Dr Mosetta Pigeon (Cardiology): f/u HF. Start [place]. Increase carvedilol to 6.25 mg  07/10/2021 - [name] - Dentistry - Dental exam 07/07/21 Dr Carlean Purl (GI): f/u NAFLD; advised low carb diet, intermittent fasting. No cirrhosis.   04/07/21 Dr Tommy Medal (ID): f/u HIV. No med changes.  03/11/21 Dr Mosetta Pigeon [facility] Cardiology): f/u HF. Resolved Afib diagnosis. Unable to afford SGLT2 Wilder Glade). C/o jitteriness, unlikely due to [facility] per MD. F/U March 2023.  Hospital visits: None in previous 6 months   Objective:  Lab Results  Component Value Date   CREATININE 1.42 (H) 03/26/2022   BUN 19 03/26/2022   GFR 30.44 (L) 01/15/2022   EGFR 53 (L) 03/26/2022   GFRNONAA 47 (L) 04/02/2020   GFRAA 54 (L) 04/02/2020   NA 136 03/26/2022   K 4.6 03/26/2022   CALCIUM 9.5 03/26/2022   CO2 25 03/26/2022   GLUCOSE 112 (H) 03/26/2022    Lab Results  Component Value Date/Time   HGBA1C 6.4 (H) 04/17/2020 09:40 AM   HGBA1C 6.2 (H) 05/10/2019 09:08 AM   GFR 30.44 (L) 01/15/2022 04:36 PM   GFR 45.60 (L) 07/07/2021 11:12 AM    Last diabetic Eye exam: No results found for: "HMDIABEYEEXA"  Last diabetic Foot exam: No results found for: "HMDIABFOOTEX"   Lab Results  Component Value Date   CHOL 137  03/26/2022   HDL 48 03/26/2022   LDLCALC 67 03/26/2022   LDLDIRECT 79.0 12/28/2016   TRIG 139 03/26/2022   CHOLHDL 2.9 03/26/2022       Latest Ref Rng & Units 03/26/2022    9:28 AM 01/15/2022    4:36 PM 07/07/2021   11:12 AM  Hepatic Function  Total Protein 6.1 - 8.1 g/dL 7.0  7.5  7.7   Albumin 3.5 - 5.2 g/dL 3.5 - 5.2 g/dL  4.6    4.6  4.8   AST 10 - 35 U/L 27  33  34   ALT 9  - 46 U/L 35  50  38   Alk Phosphatase 39 - 117 U/L  113  94   Total Bilirubin 0.2 - 1.2 mg/dL 0.6  0.6  0.9   Bilirubin, Direct 0.0 - 0.3 mg/dL  0.1      Lab Results  Component Value Date/Time   TSH 2.01 10/30/2008 10:11 AM   TSH 1.64 10/28/2007 09:22 AM   FREET4 1.05 01/09/2021 03:15 PM   FREET4 0.68 12/25/2015 09:52 AM       Latest Ref Rng & Units 03/26/2022    9:28 AM 01/15/2022    4:36 PM 07/07/2021   11:12 AM  CBC  WBC 3.8 - 10.8 Thousand/uL 5.4  6.3  5.8   Hemoglobin 13.2 - 17.1 g/dL 14.0  14.1  13.4   Hematocrit 38.5 - 50.0 % 41.4  41.3  39.7   Platelets 140 - 400 Thousand/uL 187  195.0  170.0     Lab Results  Component Value Date/Time   VD25OH 39.85 01/15/2022 04:36 PM   VD25OH 27.65 (L) 01/09/2021 03:15 PM    Clinical ASCVD: Yes  The 10-year ASCVD risk score (Arnett DK, et al., 2019) is: 15%   Values used to calculate the score:     Age: 71 years     Sex: Male     Is Non-Hispanic African American: No     Diabetic: No     Tobacco smoker: Yes     Systolic Blood Pressure: 212 mmHg     Is BP treated: Yes     HDL Cholesterol: 48 mg/dL     Total Cholesterol: 137 mg/dL    CHA2DS2/VAS Stroke Risk Points  Current as of yesterday     4 >= 2 Points: High Risk  1 - 1.99 Points: Medium Risk  0 Points: Low Risk    Last Change: N/A       Points Metrics  1 Has Congestive Heart Failure:  Yes    Current as of yesterday  1 Has Vascular Disease:  Yes    Current as of yesterday  1 Has Hypertension:  Yes    Current as of yesterday  1 Age:  71    Current as of yesterday  0 Has Diabetes:  No    Current as of yesterday  0 Had Stroke:  No  Had TIA:  No  Had Thromboembolism:  No    Current as of yesterday  0 Male:  No    Current as of yesterday          04/08/2022    9:31 AM 01/15/2022    4:13 PM 04/07/2021    9:30 AM  Depression screen PHQ 2/9  Decreased Interest 0 0 0  Down, Depressed, Hopeless 0 0 0  PHQ - 2 Score 0 0 0     Social History   Tobacco  Use  Smoking  Status Some Days   Packs/day: 2.00   Years: 40.00   Total pack years: 80.00   Types: Cigarettes, E-cigarettes   Passive exposure: Past  Smokeless Tobacco Never  Tobacco Comments   Quit 05/25/2017/    States vapes occasionally (04/08/22)   BP Readings from Last 3 Encounters:  04/08/22 103/61  01/15/22 106/76  10/01/21 104/60   Pulse Readings from Last 3 Encounters:  04/08/22 (!) 52  01/15/22 74  10/01/21 65   Wt Readings from Last 3 Encounters:  04/08/22 195 lb (88.5 kg)  01/15/22 190 lb (86.2 kg)  10/01/21 190 lb 12.8 oz (86.5 kg)   BMI Readings from Last 3 Encounters:  04/08/22 27.98 kg/m  01/15/22 27.26 kg/m  10/01/21 27.38 kg/m    Assessment/Interventions: Review of patient past medical history, allergies, medications, health status, including review of consultants reports, laboratory and other test data, was performed as part of comprehensive evaluation and provision of chronic care management services.   SDOH:  (Social Determinants of Health) assessments and interventions performed: Yes SDOH Interventions    Flowsheet Row Chronic Care Management from 07/16/2021 in [place] at [place] Interventions   Food Insecurity Interventions Intervention Not Indicated  Financial Strain Interventions Other (Comment)  [Enrolled in [employer] for Delene Loll, [place]: No Food Insecurity (07/16/2021)  Depression (PHQ2-9): Low Risk  (04/08/2022)  Financial Resource Strain: Medium Risk (07/16/2021)  Tobacco Use: High Risk (04/08/2022)    CCM Care Plan  Allergies  Allergen Reactions   Magnesium Nausea Only    Medications Reviewed Today     Reviewed by Charlton Haws, [facility] (Pharmacist) on 04/13/22 at Ages List Status: <None>   Medication Order Taking? Sig Documenting Provider Last Dose Status Informant  atorvastatin (LIPITOR) 20 MG tablet [id number] Yes TAKE 1 TABLET BY MOUTH ONCE   DAILY [name], MD Taking Active   bictegravir-emtricitabine-tenofovir AF (BIKTARVY) 50-200-25 MG TABS tablet [id number] Yes Take 1 tablet by mouth daily. Tommy Medal, Lavell Islam, MD Taking Active   carvedilol (COREG) 6.25 MG tablet [id number] Yes Take 6.25 mg by mouth 2 (two) times daily with a meal. [provider] Taking Active   dapagliflozin propanediol (FARXIGA) 10 MG TABS tablet [id number] Yes Take 5 mg by mouth daily. Tylene Fantasia, MD Taking Active   ENTRESTO 97-103 MG [id number] Yes Take 1 tablet by mouth 2 (two) times daily. [provider] Taking Active   fluconazole (DIFLUCAN) 150 MG tablet [id number] No Take 150 mg by mouth once a week. prn  Patient not taking: Reported on 04/13/2022   [provider] Not Taking Active   KLOR-CON M20 20 MEQ tablet [id number] Yes Take 20 mEq by mouth daily as needed. [provider] Taking Active   Melatonin 5 MG TABS [id number] Yes Take 5 mg by mouth at bedtime.  [provider] Taking Active Self           Med Note ([place]   Tue Sep 24, 2020  2:50 PM)    Multiple Vitamin (MULTI-VITAMIN) tablet [id number] Yes Take by mouth. [provider] Taking Active   pantoprazole (PROTONIX) 40 MG tablet [id number] Yes TAKE 1 TABLET BY MOUTH  DAILY [name], MD Taking Active   spironolactone (ALDACTONE) 25 MG tablet [id number] Yes Take 25 mg by mouth daily. [provider] Taking Active   torsemide (DEMADEX) 20 MG tablet [id number] Yes 10 mg every  other day. [provider] Taking Active   traZODone (DESYREL) 50 MG tablet [id number]  TAKE 1-2 TABLETS (50-100 MG TOTAL) BY MOUTH AT BEDTIME. [name], MD  Expired 04/07/22 2359   valACYclovir (VALTREX) 500 MG tablet [id number] Yes Take 1 tablet (500 mg total) by mouth daily as needed (for fever blisters.). Tommy Medal, Lavell Islam, MD Taking Active             Patient Active Problem List   Diagnosis Date Noted   NAFLD  (nonalcoholic fatty liver disease) 07/08/2021   Polyp of sigmoid colon    Anemia, mild 05/02/2021   Gout 01/08/2020   Stage 3b chronic kidney disease ([place]) 01/08/2020   Vertical fracture of root of tooth 08/01/2018   Xerostomia 88/50/2774   Chronic systolic heart failure ([place]) 05/05/2018   Ejection fraction < 50% 04/12/2018   S/P CABG x 3 04/10/2018   S/P mitral valve replacement with bioprosthetic valve 04/10/2018   Atherosclerotic heart disease of native coronary artery with angina pectoris ([place]) 04/04/2018   H/O adenomatous polyp of colon 02/08/2018   LVH (left ventricular hypertrophy) 01/27/2018   Mitral regurgitation 01/03/2018   Advance directive discussed with patient 01/03/2018   Episodic mood disorder ([place]) 09/24/2014   Routine general medical examination at a health care facility 07/25/2012   Chronic hepatitis B virus infection ([place]) 12/03/2008   Human immunodeficiency virus (HIV) disease ([place]) 11/21/2008   Hyperlipemia 10/28/2007   Essential hypertension 10/28/2007   ALLERGIC RHINITIS 10/28/2007   ACTINIC KERATOSIS 10/28/2007    Immunization History  Administered Date(s) Administered   Covid-19, Mrna,Vaccine(Spikevax)63yr and older 04/04/2022   Fluad Quad(high Dose 65+) 03/22/2022   Hepatitis A 02/04/2009, 11/07/2009   Influenza Split 02/19/2011, 02/10/2012, 02/16/2013   Influenza Whole 02/17/2008, 02/06/2009, 02/11/2010   Influenza, High Dose Seasonal PF 03/18/2017, 03/27/2018, 03/08/2019   Influenza,inj,Quad PF,6+ Mos 01/22/2016   Influenza-Unspecified 02/10/2014, 03/18/2020, 02/18/2021   Moderna Covid-19 Vaccine Bivalent Booster 139yr& up 02/18/2021   PFIZER Comirnaty([place])Covid-19 Tri-Sucrose Vaccine 06/22/2019, 07/14/2019, 01/26/2020, 08/10/2020   Pfizer Covid-19 Vaccine Bivalent Booster 1272yr up 09/30/2021   Pneumococcal Conjugate-13 12/25/2015   Pneumococcal Polysaccharide-23 02/04/2009, 12/28/2016   Tdap 07/25/2012   Zoster Recombinat (Shingrix)  01/11/2019, 03/31/2019   Zoster, Live 12/21/2013    Conditions to be addressed/monitored:  Hypertension, Hyperlipidemia, Heart Failure, Coronary Artery Disease, GERD, Chronic Kidney Disease, and Gout, HIV  Care Plan : CCMKnottpdates made by FolCharlton HawsPHGatewaynce 04/20/2022 12:00 AM     Problem: Hypertension, Hyperlipidemia, Heart Failure, Coronary Artery Disease, GERD, Chronic Kidney Disease, and Gout, HIV   Priority: High     Long-Range Goal: Disease mgmt   Start Date: 07/16/2021  Expected End Date: 07/16/2022  This Visit's Progress: On track  Recent Progress: On track  Priority: High  Note:   Current Barriers:  None identified  Pharmacist Clinical Goal(s):  Patient will contact provider office for questions/concerns as evidenced notation of same in electronic health record through collaboration with PharmD and provider.   Interventions: 1:1 collaboration with LetVenia CarbonD regarding development and update of comprehensive plan of care as evidenced by provider attestation and co-signature Inter-disciplinary care team collaboration (see longitudinal plan of care) Comprehensive medication review performed; medication list updated in electronic medical record  Hypertension / Heart Failure / CKD (BP goal <130/80) -Controlled - pt reports BP at home is at goal, sometimes low-normal; he takes an extra torsemide about once a week (pt weights  himself daily) -Last ejection fraction: 25%(Date: 07/2020) -HF type: HFrEF (EF < 40%); NYHA Class: III (marked limitation of activity) -Current home BP readings: n/a -Current treatment: Carvedilol 6.25 mg BID - Appropriate, Effective, Safe, Accessible Entresto 97-103 mg BID  (HWG) -Appropriate, Effective, Safe, Query Accessible Spironolactone 25 mg daily AM -Appropriate, Effective, Safe, Accessible Torsemide 10 mg QOD -Appropriate, Effective, Safe, Accessible [place] Con 20 mEq PRN (2x monthly) -Appropriate,  Effective, Safe, Accessible Farxiga 10 mg daily (HWG) - Appropriate, Effective, Safe, Accessible -Medications previously tried: amlodipine, lisinopril, hctz, metoprolol, Farxiga (cost) -Educated on BP goals and benefits of medications for prevention of heart attack, stroke and kidney damage; -Discussed benefits of [place] for reduction in HF hospitalizations and death; pt would benefit from [place] if cost is not a barrier -Assessed pt finances - he qualifies for [employer] Cardiomyopathy grant that will cover Delene Loll and Wilder Glade (if it is to be restarted - he has appt with cardiologist tomorrow and plans to discuss then) -Recommended to continue current medication  Hyperlipidemia / CAD: (LDL goal < 70) -Controlled - LDL 67 (03/2022) at goal; pt endorses compliance with statin; he reports his cardiologist told him regarding aspirin he "could take it or leave it"; he reports issues with skin/bruises while on aspirin, denies any major bleeding -Hx CABG x 3 (03/2018) -Current treatment: Atorvastatin 20 mg daily - Appropriate, Effective, Safe, Accessible -Educated on Cholesterol goals; Benefits of statin for ASCVD risk reduction; -Recommended to continue current medication  COPD (Goal: control symptoms and prevent exacerbations) -New diagnosis 03/2022 - has been referred to pulmonology per [facility] to establish care -Gold Grade: Gold 3 (FEV1 30-49%) -Current COPD Classification:  A (low sx, 0-1 moderate exacerbations, no hospitalizations) -MMRC/CAT score: not on file -Pulmonary function testing: 03/2022 - FEV1 45% predicted, FEV1/FVC 0.49 -Exacerbations requiring treatment in last 6 months: 0 -Current treatment  None -Medications previously tried: none  -Discussed types of inhalers - controller vs rescue; reviewed role of controller inhalers (LAMA, LAMA/LABA, LABA/ICS); pt wants to avoid ICS -Advised to keep appt with pulmonology; pt can contact PharmD to discuss inhalers  in future if needed  HIV (Goal: maintain suppression) -Controlled - per chart review HIV RNA is undetectable; -Hx chronic Hepatitis B -Current treatment  Biktarvy 1 tab daily - Appropriate, Effective, Safe, Accessible -Discussed overall benefits of Biktarvy vs low risks -Recommended to continue current medication  Insomnia (Goal: improve sleep) -Controlled - takes trazodone every night and reports he does need it; sometimes takes 2 -Current treatment  Trazodone 50 mg daily HS - Appropriate, Effective, Safe, Accessible Melatonin 5 mg HS - Appropriate, Effective, Safe, Accessible -Medications previously tried: zolpidem  -Recommended to continue current medication  GERD (Goal: manage symptoms) -Controlled - pt reports GERD improved after retiring due to reduced stress, he has reduced dosing to every other day and denies issues -Current treatment  Pantoprazole 40 mg QOD - Appropriate, Effective, Safe, Accessible -Discussed he can use PPI PRN at this point  Health Maintenance -Vaccine gaps: None -Dx NASH - follows with GI who has advised low carb diet and intermittent fasting -Hx Afib - per chart review appears isolated episode, cardiology has resolved Afib diagnosis in chart -Hx gout; off of allopurinol since early 2022, no flare since -04/13/22: Pt reports lip swelling (upper R lip only) about once a month for past several months; resolves over a few hrs, taking Benadryl; he cannot identify diet trigger for these episodes  Patient Goals/Self-Care Activities Patient will:  - take medications  as prescribed as evidenced by patient report and record review focus on medication adherence by routine check blood pressure daily, document, and provide at future appointments weigh daily, and contact provider if weight gain of 5+ lbs collaborate with provider on medication access solutions Delene Loll, Wilder Glade) engage in dietary modifications by reducing carbs, high-purine foods         Medication Assistance:  Enrolled in [place] - cardiomyopathy fund for [employer] (and possibly [place]) Active 06/16/21 - 06/15/22 BIN: [id number]  PCN [id number] ID: [id number]  FVC:[id number]  Compliance/Adherence/Medication fill history: Care Gaps: NONE  Star-Rating Drugs: Atorvastatin - PDC 100% Wilder Glade - PDC 98%  Medication Access: Within the past 30 days, how often has patient missed a dose of medication? 0 Is a pillbox or other method used to improve adherence? Yes  Factors that may affect medication adherence? no barriers identified Are meds synced by current pharmacy? No  Are meds delivered by current pharmacy? Yes  Does patient experience delays in picking up medications due to transportation concerns? No   [facility] Reviewed: Is patient disadvantaged to use [facility]?: Yes  Current Rx insurance plan: [facility] Name and location of Current pharmacy:  [place] [place] [place] [address] Phone: [phone] Fax: [phone]  [id number] [address] [place]: [phone]: [address] [address]Phone: [phone]: [phone] [facility] services reviewed with patient today?: No  Patient requests to transfer care to [facility]?: No  Reason patient declined to change pharmacies: Disadvantaged due to insurance/mail order   Care Plan and Follow Up Patient Decision:  Patient agrees to Care Plan and Follow-up.  Plan: Telephone follow up appointment with care management team member scheduled for:  6 months  LCharlene Brooke PharmD, BCACP Clinical Pharmacist LSnellingPrimary Care at [phone]

## 2022-04-20 MED ORDER — BIKTARVY 50-200-25 MG PO TABS: 1.0000 | ORAL_TABLET | Freq: Every day | ORAL | 11 refills | Status: DC

## 2022-04-20 NOTE — Patient Instructions (Signed)
Visit Information  Phone number for Pharmacist: [phone]   Goals Addressed   None     Care Plan : Joseph  Updates made by Charlton Haws, [facility] since 04/20/2022 12:00 AM     Problem: Hypertension, Hyperlipidemia, Heart Failure, Coronary Artery Disease, GERD, Chronic Kidney Disease, and Gout, HIV   Priority: High     Long-Range Goal: Disease mgmt   Start Date: 07/16/2021  Expected End Date: 07/16/2022  This Visit's Progress: On track  Recent Progress: On track  Priority: High  Note:   Current Barriers:  None identified  Pharmacist Clinical Goal(s):  Patient will contact provider office for questions/concerns as evidenced notation of same in electronic health record through collaboration with PharmD and provider.   Interventions: 1:1 collaboration with [name], MD regarding development and update of comprehensive plan of care as evidenced by provider attestation and co-signature Inter-disciplinary care team collaboration (see longitudinal plan of care) Comprehensive medication review performed; medication list updated in electronic medical record  Hypertension / Heart Failure / CKD (BP goal <130/80) -Controlled - pt reports BP at home is at goal, sometimes low-normal; he takes an extra torsemide about once a week (pt weights himself daily) -Last ejection fraction: 25%(Date: 07/2020) -HF type: HFrEF (EF < 40%); NYHA Class: III (marked limitation of activity) -Current home BP readings: n/a -Current treatment: Carvedilol 6.25 mg BID - Appropriate, Effective, Safe, Accessible Entresto 97-103 mg BID  (HWG) -Appropriate, Effective, Safe, Query Accessible Spironolactone 25 mg daily AM -Appropriate, Effective, Safe, Accessible Torsemide 10 mg QOD -Appropriate, Effective, Safe, Accessible Klor Con 20 mEq PRN (2x monthly) -Appropriate, Effective, Safe, Accessible Farxiga 10 mg daily (HWG) - Appropriate, Effective, Safe, Accessible -Medications  previously tried: amlodipine, lisinopril, hctz, metoprolol, Farxiga (cost) -Educated on BP goals and benefits of medications for prevention of heart attack, stroke and kidney damage; -Discussed benefits of [place] for reduction in HF hospitalizations and death; pt would benefit from [place] if cost is not a barrier -Assessed pt finances - he qualifies for [employer] that will cover Delene Loll and [place] (if it is to be restarted - he has appt with cardiologist tomorrow and plans to discuss then) -Recommended to continue current medication  Hyperlipidemia / CAD: (LDL goal < 70) -Controlled - LDL 67 (03/2022) at goal; pt endorses compliance with statin; he reports his cardiologist told him regarding aspirin he "could take it or leave it"; he reports issues with skin/bruises while on aspirin, denies any major bleeding -Hx CABG x 3 (03/2018) -Current treatment: Atorvastatin 20 mg daily - Appropriate, Effective, Safe, Accessible -Educated on Cholesterol goals; Benefits of statin for ASCVD risk reduction; -Recommended to continue current medication  COPD (Goal: control symptoms and prevent exacerbations) -New diagnosis 03/2022 - has been referred to pulmonology per [facility] to establish care -Gold Grade: Gold 3 (FEV1 30-49%) -Current COPD Classification:  A (low sx, 0-1 moderate exacerbations, no hospitalizations) -MMRC/CAT score: not on file -Pulmonary function testing: 03/2022 - FEV1 45% predicted, FEV1/FVC 0.49 -Exacerbations requiring treatment in last 6 months: 0 -Current treatment  None -Medications previously tried: none  -Discussed types of inhalers - controller vs rescue; reviewed role of controller inhalers (LAMA, LAMA/LABA, LABA/ICS); pt wants to avoid ICS -Advised to keep appt with pulmonology; pt can contact PharmD to discuss inhalers in future if needed  HIV (Goal: maintain suppression) -Controlled - per chart review HIV RNA is  undetectable; -Hx chronic Hepatitis B -Current treatment  Biktarvy 1 tab daily -  Appropriate, Effective, Safe, Accessible -Discussed overall benefits of Biktarvy vs low risks -Recommended to continue current medication  Insomnia (Goal: improve sleep) -Controlled - takes trazodone every night and reports he does need it; sometimes takes 2 -Current treatment  Trazodone 50 mg daily HS - Appropriate, Effective, Safe, Accessible Melatonin 5 mg HS - Appropriate, Effective, Safe, Accessible -Medications previously tried: zolpidem  -Recommended to continue current medication  GERD (Goal: manage symptoms) -Controlled - pt reports GERD improved after retiring due to reduced stress, he has reduced dosing to every other day and denies issues -Current treatment  Pantoprazole 40 mg QOD - Appropriate, Effective, Safe, Accessible -Discussed he can use PPI PRN at this point  Health Maintenance -Vaccine gaps: None -Dx NASH - follows with GI who has advised low carb diet and intermittent fasting -Hx Afib - per chart review appears isolated episode, cardiology has resolved Afib diagnosis in chart -Hx gout; off of allopurinol since early 2022, no flare since -04/13/22: Pt reports lip swelling (upper R lip only) about once a month for past several months; resolves over a few hrs, taking Benadryl; he cannot identify diet trigger for these episodes  Patient Goals/Self-Care Activities Patient will:  - take medications as prescribed as evidenced by patient report and record review focus on medication adherence by routine check blood pressure daily, document, and provide at future appointments weigh daily, and contact provider if weight gain of 5+ lbs collaborate with provider on medication access solutions Delene Loll, Farxiga) engage in dietary modifications by reducing carbs, high-purine foods       Patient verbalizes understanding of instructions and care plan provided today and agrees to view in  [place]. Active MyChart status and patient understanding of how to access instructions and care plan via MyChart confirmed with patient.    Telephone follow up appointment with pharmacy team member scheduled for: 6 months  Charlene Brooke, PharmD, [facility] Clinical Pharmacist [facility] [phone]

## 2022-04-20 NOTE — Addendum Note (Signed)
Addended by: Eugenia Mcalpine on: 04/20/2022 10:30 AM   Modules accepted: Orders

## 2022-04-26 ENCOUNTER — Encounter: Payer: Self-pay | Admitting: Internal Medicine

## 2022-04-27 MED ORDER — TRAZODONE HCL 50 MG PO TABS: 50.0000 mg | ORAL_TABLET | Freq: Every day | ORAL | 3 refills | Status: DC

## 2022-05-05 DIAGNOSIS — R0602 Shortness of breath: Secondary | ICD-10-CM | POA: Diagnosis not present

## 2022-05-27 ENCOUNTER — Telehealth: Payer: Self-pay

## 2022-05-27 NOTE — Progress Notes (Cosign Needed Addendum)
Care Management & Coordination Services Pharmacy Team  Reason for Encounter: COPD  Contacted patient to discuss COPD disease state  Current COPD regimen: Trelegy and Albuterol  Any recent hospitalizations or ED visits since last visit with CPP? No  denies COPD symptoms, including Increased shortness of breath , Rescue medicine is not helping, Shortness of breath at rest, Symptoms worse with exercise, Symptoms worse at night, and Wheezing  Patient stated he has developed Thrush and was prescribed medication from Pulmonology. Patient reports he is starting to feel better and has no concerns.    What recent interventions/DTPs have been made by any provider to improve breathing since last visit: No recent interventions   Have you had exacerbation/flare-up since last visit? No  What do you do when you are short of breath?  Nebulizer, but patient states he does not have really bad attacks.   Current tobacco use? Interested in cessation? No  Respiratory Devices/Equipment Do you have a nebulizer? Yes with albuterol  Do you use a Peak Flow Meter? No Do you use a maintenance inhaler? Yes Trelegy Ellipta 100-62.5-25 How often do you forget to use your daily inhaler? Never Do you use a rescue inhaler? No How often do you use your rescue inhaler?  No Do you use a spacer with your inhaler? No  Adherence Review: Does the patient have >5 day gap between last estimated fill date for maintenance inhaler medications? No  CCM appointment on 10/20/2022  Care Gaps: Annual wellness visit in last year? Yes 01/15/2022 Most Recent BP reading: 103/61 on 04/08/2022  Summary of recommendations from last [place] visit (Date:04/20/2022) Summary: CCM Initial visit -Reviewed medications; pt affirms compliance -Pt reports new "emphysema" diagnosis per cardiology (PFTs earlier this month), he has pulmonology appt upcoming to establish care; he has not started any inhalers yet - discussed which options  are available (LAMA, LAMA/LABA, LABA/ICS) and he would like to avoid ICS   Recommendations/Changes made from today's visit: -No med changes -Advised pt to contact PharmD if he is prescribed an inhaler and needs help with cost   Plan: -[place] will call patient 1 month re: COPD update -Pharmacist follow up televisit scheduled for 6 months -PCP AWV 01/18/23  [facility] Medication Name  Last Fill Date  Day supply Atorvastatin 20 mg  04/07/2022  90 Farxiga 10 mg   05/14/2022  90  Charlene Brooke, PharmD notified  Marijean Niemann, [place] Assistant [phone]

## 2022-06-03 NOTE — Telephone Encounter (Signed)
Pt reports he recently developed thrush.  He is on Trelegy, which can contribute to thrush. Recommend to brush teeth after using Trelegy to try to limit this in the future.

## 2022-06-03 NOTE — Telephone Encounter (Cosign Needed)
Patient was made aware.  Charlene Brooke, PharmD notified  Marijean Niemann, [place] Clinical Pharmacy Assistant [phone]

## 2022-06-10 ENCOUNTER — Encounter: Payer: Self-pay | Admitting: Infectious Disease

## 2022-06-24 ENCOUNTER — Other Ambulatory Visit (HOSPITAL_COMMUNITY): Payer: Self-pay

## 2022-06-24 NOTE — Telephone Encounter (Signed)
Which Hep B med is he supposed to be on because he have part D and there are no patient assistance open for Hep B . I ran a few test claims and Vemlidy $362.00, Baraclude $26.00, Viread $14.68.

## 2022-06-25 ENCOUNTER — Other Ambulatory Visit (HOSPITAL_COMMUNITY): Payer: Self-pay

## 2022-07-09 ENCOUNTER — Other Ambulatory Visit (HOSPITAL_COMMUNITY): Payer: Self-pay

## 2022-07-09 ENCOUNTER — Telehealth: Payer: Self-pay

## 2022-07-09 NOTE — Telephone Encounter (Signed)
[facility] Patient Advocate Encounter   I was successful in securing patient a $5,000.00 grant from [employer] [place] (PAF) to provide copayment coverage for Biktarvy.  This will make the out of pocket cost $0.00.     I have spoken with the patient.    The billing information is as follows and has been shared with [place].          Patient knows to call the office with questions or concerns.  Ileene Patrick, [facility] Patient [facility] for Infectious Disease Phone: [phone] Fax:  [phone]

## 2022-07-11 ENCOUNTER — Encounter: Payer: Self-pay | Admitting: Internal Medicine

## 2022-07-22 DIAGNOSIS — I5042 Chronic combined systolic (congestive) and diastolic (congestive) heart failure: Secondary | ICD-10-CM | POA: Diagnosis not present

## 2022-07-22 DIAGNOSIS — J439 Emphysema, unspecified: Secondary | ICD-10-CM | POA: Diagnosis not present

## 2022-07-22 DIAGNOSIS — R0602 Shortness of breath: Secondary | ICD-10-CM | POA: Diagnosis not present

## 2022-08-31 DIAGNOSIS — H35371 Puckering of macula, right eye: Secondary | ICD-10-CM | POA: Diagnosis not present

## 2022-08-31 DIAGNOSIS — H26491 Other secondary cataract, right eye: Secondary | ICD-10-CM | POA: Diagnosis not present

## 2022-09-03 DIAGNOSIS — J449 Chronic obstructive pulmonary disease, unspecified: Secondary | ICD-10-CM | POA: Diagnosis not present

## 2022-09-14 DIAGNOSIS — H26491 Other secondary cataract, right eye: Secondary | ICD-10-CM | POA: Diagnosis not present

## 2022-09-28 ENCOUNTER — Other Ambulatory Visit: Payer: Self-pay | Admitting: Internal Medicine

## 2022-10-15 ENCOUNTER — Telehealth: Payer: Self-pay

## 2022-10-15 NOTE — Progress Notes (Signed)
Care Management & Coordination Services Pharmacy Team  Reason for Encounter: Appointment Reminder  Contacted patient to confirm telephone appointment with Al Corpus, PharmD on 10/20/22 at 10:00. Spoke with patient on 10/15/2022  patient cancelled appointment and was upset that one had been made without his knowledge. Patient was rude on the phone and hung up on me .    Al Corpus, PharmD notified  Dale Mcdonald, [facility] Clinical Pharmacy Assistant [phone]

## 2022-10-16 NOTE — Telephone Encounter (Signed)
Phone appt cancelled. Appt was originally made in November after last phone appt with patient, for 6 month follow up.

## 2022-10-20 ENCOUNTER — Encounter: Payer: Medicare Other | Admitting: Pharmacist

## 2022-12-23 ENCOUNTER — Encounter (INDEPENDENT_AMBULATORY_CARE_PROVIDER_SITE_OTHER): Payer: Self-pay

## 2022-12-24 ENCOUNTER — Other Ambulatory Visit: Payer: Self-pay | Admitting: Oncology

## 2022-12-24 ENCOUNTER — Other Ambulatory Visit
Admission: RE | Admit: 2022-12-24 | Discharge: 2022-12-24 | Disposition: A | Discharge status: Home / Self Care | Payer: Medicare Other | Attending: Oncology | Admitting: Oncology

## 2022-12-24 DIAGNOSIS — Z006 Encounter for examination for normal comparison and control in clinical research program: Secondary | ICD-10-CM

## 2023-01-18 ENCOUNTER — Ambulatory Visit (INDEPENDENT_AMBULATORY_CARE_PROVIDER_SITE_OTHER): Payer: Medicare Other | Admitting: Internal Medicine

## 2023-01-18 ENCOUNTER — Encounter: Payer: Self-pay | Admitting: Internal Medicine

## 2023-01-18 VITALS — BP 110/70 | HR 55 | Temp 97.9°F | Ht 70.0 in | Wt 194.0 lb

## 2023-01-18 DIAGNOSIS — I5022 Chronic systolic (congestive) heart failure: Secondary | ICD-10-CM

## 2023-01-18 DIAGNOSIS — B009 Herpesviral infection, unspecified: Secondary | ICD-10-CM

## 2023-01-18 DIAGNOSIS — Z0000 Encounter for general adult medical examination without abnormal findings: Secondary | ICD-10-CM

## 2023-01-18 DIAGNOSIS — B181 Chronic viral hepatitis B without delta-agent: Secondary | ICD-10-CM

## 2023-01-18 DIAGNOSIS — I25119 Atherosclerotic heart disease of native coronary artery with unspecified angina pectoris: Secondary | ICD-10-CM | POA: Diagnosis not present

## 2023-01-18 DIAGNOSIS — N1832 Chronic kidney disease, stage 3b: Secondary | ICD-10-CM

## 2023-01-18 DIAGNOSIS — B20 Human immunodeficiency virus [HIV] disease: Secondary | ICD-10-CM

## 2023-01-18 MED ORDER — VALACYCLOVIR HCL 500 MG PO TABS: 500.0000 mg | ORAL_TABLET | Freq: Every day | ORAL | 3 refills | Status: DC | PRN

## 2023-01-18 MED ORDER — FLUCONAZOLE 150 MG PO TABS: 150.0000 mg | ORAL_TABLET | ORAL | 3 refills | Status: DC

## 2023-01-18 NOTE — Assessment & Plan Note (Signed)
I have personally reviewed the Medicare Annual Wellness questionnaire and have noted 1. The patient's medical and social history 2. Their use of alcohol, tobacco or illicit drugs 3. Their current medications and supplements 4. The patient's functional ability including ADL's, fall risks, home safety risks and hearing or visual             impairment. 5. Diet and physical activities 6. Evidence for depression or mood disorders  The patients weight, height, BMI and visual acuity have been recorded in the chart I have made referrals, counseling and provided education to the patient based review of the above and I have provided the pt with a written personalized care plan for preventive services.  I have provided you with a copy of your personalized plan for preventive services. Please take the time to review along with your updated medication list.  Does exercise regularly Colon due 2028 Done with cancer screening Update COVID and flu vaccines soon One time RSV this fall also

## 2023-01-18 NOTE — Progress Notes (Signed)
Subjective:    Patient ID: Dale Mcdonald, male    DOB: 22-Dec-1950, 72 y.o.   MRN: [id number]  HPI Here for Medicare wellness visit and follow up of chronic health conditions Reviewed advanced directives Reviewed other doctors--Dr Finnegan--oncology, Dr Thomasene Lot, Dr Epimenio Foot, Dr Devore--cardiology, Dr VanDam--infectious disease, Dr Brennan Bailey, [facility] goes to [facility] 4 days a week and works out pretty well No hospitalizations or surgery in the past year Vision is okay Hearing is fine Occasional beer No tobacco No falls No sig depression or anhedonia Independent with instrumental ADLs No worrisome memory issues  Reviewed labs Last GFR 53  Tends to be very "cold natured"  Diagnosed with COPD Now on trelegy Works great but gives him thrush at [employer] about rinsing Breathing is some better--but still gets very winded with any load/or under stress  No chest pain No palpitations  Notices his BP does drop at times--if bends over soon after morning meds--is careful No syncope No sig edema with every other day torsemide Sleeps slightly elevated in bed---no PND  Mood is okay Anxiety has eased some---"with age" Sleeps okay with the trazodone  HIV has been quiet  Current Outpatient Medications on File Prior to Visit  Medication Sig Dispense Refill   albuterol (PROVENTIL HFA) 108 (90 Base) MCG/ACT inhaler Inhale into the lungs every 6 (six) hours as needed for wheezing or shortness of breath.     atorvastatin (LIPITOR) 20 MG tablet TAKE 1 TABLET BY MOUTH ONCE  DAILY 100 tablet 3   bictegravir-emtricitabine-tenofovir AF (BIKTARVY) 50-200-25 MG TABS tablet Take 1 tablet by mouth daily. 30 tablet 11   carvedilol (COREG) 6.25 MG tablet Take 6.25 mg by mouth 2 (two) times daily with a meal.     dapagliflozin propanediol (FARXIGA) 10 MG TABS tablet Take 5 mg by mouth daily.     ENTRESTO 97-103 MG Take 1 tablet by mouth 2 (two) times daily.      fluconazole (DIFLUCAN) 150 MG tablet Take 150 mg by mouth once a week. prn     KLOR-CON M20 20 MEQ tablet Take 20 mEq by mouth daily as needed.     Melatonin 5 MG TABS Take 5 mg by mouth at bedtime.      Multiple Vitamin (MULTI-VITAMIN) tablet Take by mouth.     pantoprazole (PROTONIX) 40 MG tablet TAKE 1 TABLET BY MOUTH DAILY 100 tablet 3   spironolactone (ALDACTONE) 25 MG tablet Take 25 mg by mouth daily.     torsemide (DEMADEX) 20 MG tablet 10 mg every other day.     traZODone (DESYREL) 50 MG tablet Take 1-2 tablets (50-100 mg total) by mouth at bedtime. 180 tablet 3   TRELEGY ELLIPTA 100-62.5-25 MCG/ACT AEPB Inhale 1 puff into the lungs daily.     valACYclovir (VALTREX) 500 MG tablet Take 1 tablet (500 mg total) by mouth daily as needed (for fever blisters.). 30 tablet 5   No current facility-administered medications on file prior to visit.    Allergies  Allergen Reactions   Magnesium Nausea Only    Past Medical History:  Diagnosis Date   Anemia 04/07/2021   Anxiety 03/29/2017   Cirrhosis (HCC)    Heart murmur    Hepatitis    B   HIV (human immunodeficiency virus infection) (HCC)    Hx of adenomatous polyp of colon 02/08/2018   Hyperglycemia 04/17/2020   Hyperlipidemia    Hypertension    NAFLD (nonalcoholic fatty liver disease) 5/78/4696   Cause likely  abdominal obesity/metabolic syndrome plus Biktarvy Hepatic elastography with high likelihood normal i.e. no cirrhosis 2022   Orthostatic dizziness 04/07/2021   Palpitations     Past Surgical History:  Procedure Laterality Date   CATARACT EXTRACTION W/PHACO Left 11/11/2017   Procedure: CATARACT EXTRACTION PHACO AND INTRAOCULAR LENS PLACEMENT (IOC);  Surgeon: Galen Manila, MD;  Location: [facility] ORS;  Service: Ophthalmology;  Laterality: Left;  [place] 00:59.3 AP% 19.5 CDE 11.54 Fluid pack lot # [id number]   CATARACT EXTRACTION W/PHACO Right 12/21/2017   Procedure: CATARACT EXTRACTION PHACO AND INTRAOCULAR LENS PLACEMENT (IOC);   Surgeon: Galen Manila, MD;  Location: [facility] ORS;  Service: Ophthalmology;  Laterality: Right;  [place] 00:40 AP% 13.5 CDE 5.50 Fluid pack lot $ [id number]   COLONOSCOPY     COLONOSCOPY WITH PROPOFOL N/A 02/08/2018   Procedure: COLONOSCOPY WITH PROPOFOL;  Surgeon: Wyline Mood, MD;  Location: [facility] ENDOSCOPY;  Service: Gastroenterology;  Laterality: N/A;   COLONOSCOPY WITH PROPOFOL N/A 06/03/2021   Procedure: COLONOSCOPY WITH PROPOFOL;  Surgeon: Toney Reil, MD;  Location: [facility] ENDOSCOPY;  Service: Gastroenterology;  Laterality: N/A;   CORONARY ARTERY BYPASS GRAFT  04/07/2018   EYE SURGERY     RETINA   HERNIA REPAIR     MITRAL VALVE REPLACEMENT  03/2018   bovine--with CABG    History reviewed. No pertinent family history.  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  Tobacco Use   Smoking status: Some Days    Current packs/day: 2.00    Average packs/day: 2.0 packs/day for 40.0 years (80.0 ttl pk-yrs)    Types: Cigarettes, E-cigarettes    Passive exposure: Past   Smokeless tobacco: Never   Tobacco comments:    Quit 05/25/2017/     States vapes occasionally (04/08/22)  Substance and Sexual Activity   Alcohol use: Yes    Comment: States rare - a beer a month   Drug use: No   Sexual activity: Not on file    Comment: declined condoms  Other Topics Concern   Not on file  Social History Narrative   Retired and widowed   1 son   Former smoker   3 caffeinated beverages a day   No alcohol no drug use no smokeless tobacco      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 [employer]      No living will   Would want son Dale Mcdonald to be Management consultant   Would accept  resuscitation but no prolonged ventilation   Not sure about tube feeds--but definitely not long term   Social Determinants of Health   Financial Resource Strain: Medium Risk (07/16/2021)   Overall Financial Resource Strain (CARDIA)    Difficulty of Paying Living Expenses: Somewhat hard  Food Insecurity: No Food Insecurity (07/16/2021)   Hunger Vital Sign    Worried About Running Out of Food in the Last Year: Never true    Ran Out of Food in the Last Year: Never true  Transportation Needs: Not on file  Physical Activity: Not on file  Stress: Not on file  Social Connections: Not on file  Intimate Partner Violence: Not on file   Review of Systems  Appetite is good Weight is about the same Wears seat belt Teeth are okay--keeps up with dentist No suspicious skin lesions Some heartburn---better now with every other day pantoprazole. No dysphagia Bowels move fine--no blood Voids fine--stream is pretty good (especially after torsemide). Usually no nocturia    Objective:   Physical Exam Constitutional:      Appearance: Normal appearance.  HENT:     Mouth/Throat:     Pharynx: No oropharyngeal exudate or posterior oropharyngeal erythema.  Eyes:     Conjunctiva/sclera: Conjunctivae normal.     Pupils: Pupils are equal, round, and reactive to light.  Cardiovascular:     Rate and Rhythm: Normal rate and regular rhythm.     Pulses: Normal pulses.     Heart sounds: No murmur heard.    No gallop.  Pulmonary:     Effort: Pulmonary effort is normal.     Breath sounds: No wheezing or rales.     Comments: Decreased breath sounds but clear Abdominal:     Palpations: Abdomen is soft.     Tenderness: There is no abdominal tenderness.  Musculoskeletal:     Cervical back: Neck supple.     Right lower leg: No edema.     Left lower leg: No edema.  Lymphadenopathy:     Cervical: No cervical adenopathy.  Skin:    Findings: No lesion or rash.  Neurological:     General: No focal deficit  present.     Mental Status: He is alert and oriented to person, place, and time.     Comments: Word naming-- 14/1 minute Recall 3/3  Psychiatric:        Mood and Affect: Mood normal.        Behavior: Behavior normal.            Assessment & Plan:

## 2023-01-18 NOTE — Assessment & Plan Note (Signed)
Goes back to ID in November On biktarvy

## 2023-01-18 NOTE — Assessment & Plan Note (Signed)
Was better the last time On the valsartan

## 2023-01-18 NOTE — Assessment & Plan Note (Signed)
Stable DOE---may be COPD or heart On the CHF meds and atorvastatin 20mg  daily

## 2023-01-18 NOTE — Progress Notes (Signed)
Hearing Screening - Comments:: Passed whisper test Vision Screening - Comments:: April 2024  

## 2023-01-18 NOTE — Assessment & Plan Note (Addendum)
Compensated on carvedilol 6.25 bid, farxiga 10, entresto 97/103 bid , torsemide 10 every other day, and spironolactone 25 daily

## 2023-01-18 NOTE — Assessment & Plan Note (Signed)
Is on biktarvy 50/200/25

## 2023-01-19 LAB — LIPID PANEL
Cholesterol: 146 mg/dL (ref 0–200)
HDL: 40.1 mg/dL (ref >39.00)
LDL Cholesterol: 73 mg/dL (ref 0–99)
NonHDL: 105.94
Total CHOL/HDL Ratio: 4
Triglycerides: 165 mg/dL — ABNORMAL HIGH (ref 0.0–149.0)
VLDL: 33 mg/dL (ref 0.0–40.0)

## 2023-01-19 LAB — RENAL FUNCTION PANEL
Albumin: 4.5 g/dL (ref 3.5–5.2)
BUN: 25 mg/dL — ABNORMAL HIGH (ref 6–23)
CO2: 28 meq/L (ref 19–32)
Calcium: 10 mg/dL (ref 8.4–10.5)
Chloride: 95 mEq/L — ABNORMAL LOW (ref 96–112)
Creatinine, Ser: 1.73 mg/dL — ABNORMAL HIGH (ref 0.40–1.50)
GFR: 39.23 mL/min — ABNORMAL LOW (ref >60.00)
Glucose, Bld: 88 mg/dL (ref 70–99)
Phosphorus: 4.1 mg/dL (ref 2.3–4.6)
Potassium: 4.7 meq/L (ref 3.5–5.1)
Sodium: 130 mEq/L — ABNORMAL LOW (ref 135–145)

## 2023-01-19 LAB — CBC
HCT: 46.3 % (ref 39.0–52.0)
Hemoglobin: 15.3 g/dL (ref 13.0–17.0)
MCHC: 33 g/dL (ref 30.0–36.0)
MCV: 96.7 fl (ref 78.0–100.0)
Platelets: 183 10*3/uL (ref 150.0–400.0)
RBC: 4.79 Mil/uL (ref 4.22–5.81)
RDW: 13.6 % (ref 11.5–15.5)
WBC: 6.3 10*3/uL (ref 4.0–10.5)

## 2023-01-19 LAB — HEPATIC FUNCTION PANEL
ALT: 23 U/L (ref 0–53)
AST: 18 U/L (ref 0–37)
Albumin: 4.5 g/dL (ref 3.5–5.2)
Alkaline Phosphatase: 82 U/L (ref 39–117)
Bilirubin, Direct: 0.2 mg/dL (ref 0.0–0.3)
Total Bilirubin: 0.7 mg/dL (ref 0.2–1.2)
Total Protein: 7.2 g/dL (ref 6.0–8.3)

## 2023-01-19 LAB — TSH: TSH: 1.12 u[IU]/mL (ref 0.35–5.50)

## 2023-01-19 LAB — VITAMIN D 25 HYDROXY (VIT D DEFICIENCY, FRACTURES): VITD: 41.46 ng/mL (ref 30.00–100.00)

## 2023-01-19 LAB — PARATHYROID HORMONE, INTACT (NO CA): PTH: 39 pg/mL (ref 16–77)

## 2023-01-24 ENCOUNTER — Other Ambulatory Visit: Payer: Self-pay | Admitting: Internal Medicine

## 2023-02-10 DIAGNOSIS — I5032 Chronic diastolic (congestive) heart failure: Secondary | ICD-10-CM | POA: Diagnosis not present

## 2023-02-10 DIAGNOSIS — Z8614 Personal history of Methicillin resistant Staphylococcus aureus infection: Secondary | ICD-10-CM | POA: Diagnosis not present

## 2023-02-10 DIAGNOSIS — I110 Hypertensive heart disease with heart failure: Secondary | ICD-10-CM | POA: Diagnosis not present

## 2023-02-10 DIAGNOSIS — Z953 Presence of xenogenic heart valve: Secondary | ICD-10-CM | POA: Diagnosis not present

## 2023-02-10 DIAGNOSIS — I255 Ischemic cardiomyopathy: Secondary | ICD-10-CM | POA: Diagnosis not present

## 2023-02-10 DIAGNOSIS — I5042 Chronic combined systolic (congestive) and diastolic (congestive) heart failure: Secondary | ICD-10-CM | POA: Diagnosis not present

## 2023-02-10 DIAGNOSIS — Z951 Presence of aortocoronary bypass graft: Secondary | ICD-10-CM | POA: Diagnosis not present

## 2023-02-16 ENCOUNTER — Other Ambulatory Visit: Payer: Self-pay

## 2023-02-16 DIAGNOSIS — B181 Chronic viral hepatitis B without delta-agent: Secondary | ICD-10-CM

## 2023-02-16 DIAGNOSIS — B20 Human immunodeficiency virus [HIV] disease: Secondary | ICD-10-CM

## 2023-02-16 DIAGNOSIS — Z113 Encounter for screening for infections with a predominantly sexual mode of transmission: Secondary | ICD-10-CM

## 2023-02-16 DIAGNOSIS — Z79899 Other long term (current) drug therapy: Secondary | ICD-10-CM

## 2023-02-16 NOTE — Addendum Note (Signed)
Addended by: Linna Hoff D on: 02/16/2023 08:23 AM   Modules accepted: Orders

## 2023-02-17 ENCOUNTER — Other Ambulatory Visit: Payer: Self-pay | Admitting: Infectious Disease

## 2023-03-31 ENCOUNTER — Other Ambulatory Visit (HOSPITAL_COMMUNITY)
Admission: RE | Admit: 2023-03-31 | Discharge: 2023-03-31 | Disposition: A | Discharge status: Home / Self Care | Payer: Medicare Other | Source: Ambulatory Visit | Attending: Infectious Disease | Admitting: Infectious Disease

## 2023-03-31 ENCOUNTER — Other Ambulatory Visit: Payer: Medicare Other

## 2023-03-31 ENCOUNTER — Other Ambulatory Visit: Payer: Self-pay

## 2023-03-31 DIAGNOSIS — B20 Human immunodeficiency virus [HIV] disease: Secondary | ICD-10-CM | POA: Diagnosis present

## 2023-03-31 DIAGNOSIS — Z113 Encounter for screening for infections with a predominantly sexual mode of transmission: Secondary | ICD-10-CM | POA: Diagnosis present

## 2023-03-31 DIAGNOSIS — B181 Chronic viral hepatitis B without delta-agent: Secondary | ICD-10-CM

## 2023-03-31 DIAGNOSIS — Z79899 Other long term (current) drug therapy: Secondary | ICD-10-CM | POA: Diagnosis not present

## 2023-03-31 DIAGNOSIS — Z0000 Encounter for general adult medical examination without abnormal findings: Secondary | ICD-10-CM | POA: Diagnosis not present

## 2023-04-01 LAB — T-HELPER CELL (CD4) - (RCID CLINIC ONLY)
CD4 % Helper T Cell: 35 % (ref 33–65)
CD4 T Cell Abs: 578 /uL (ref 400–1790)

## 2023-04-01 LAB — URINE CYTOLOGY ANCILLARY ONLY
Chlamydia: NEGATIVE
Comment: NEGATIVE
Comment: NORMAL
Neisseria Gonorrhea: NEGATIVE

## 2023-04-03 LAB — CBC WITH DIFFERENTIAL/PLATELET
Absolute Lymphocytes: 1728 {cells}/uL (ref 850–3900)
Absolute Monocytes: 481 {cells}/uL (ref 200–950)
Basophils Absolute: 52 {cells}/uL (ref 0–200)
Basophils Relative: 0.9 %
Eosinophils Absolute: 70 {cells}/uL (ref 15–500)
Eosinophils Relative: 1.2 %
HCT: 47.5 % (ref 38.5–50.0)
Hemoglobin: 15.8 g/dL (ref 13.2–17.1)
MCH: 32.3 pg (ref 27.0–33.0)
MCHC: 33.3 g/dL (ref 32.0–36.0)
MCV: 97.1 fL (ref 80.0–100.0)
MPV: 9.9 fL (ref 7.5–12.5)
Monocytes Relative: 8.3 %
Neutro Abs: 3468 {cells}/uL (ref 1500–7800)
Neutrophils Relative %: 59.8 %
Platelets: 213 10*3/uL (ref 140–400)
RBC: 4.89 10*6/uL (ref 4.20–5.80)
RDW: 13.7 % (ref 11.0–15.0)
Total Lymphocyte: 29.8 %
WBC: 5.8 10*3/uL (ref 3.8–10.8)

## 2023-04-03 LAB — RPR: RPR Ser Ql: NONREACTIVE

## 2023-04-03 LAB — COMPLETE METABOLIC PANEL WITH GFR
AG Ratio: 1.8 (calc) (ref 1.0–2.5)
ALT: 35 U/L (ref 9–46)
AST: 27 U/L (ref 10–35)
Albumin: 4.4 g/dL (ref 3.6–5.1)
Alkaline phosphatase (APISO): 96 U/L (ref 35–144)
BUN/Creatinine Ratio: 13 (calc) (ref 6–22)
BUN: 22 mg/dL (ref 7–25)
CO2: 22 mmol/L (ref 20–32)
Calcium: 9.7 mg/dL (ref 8.6–10.3)
Chloride: 99 mmol/L (ref 98–110)
Creat: 1.63 mg/dL — ABNORMAL HIGH (ref 0.70–1.28)
Globulin: 2.4 g/dL (ref 1.9–3.7)
Glucose, Bld: 122 mg/dL — ABNORMAL HIGH (ref 65–99)
Potassium: 4 mmol/L (ref 3.5–5.3)
Sodium: 132 mmol/L — ABNORMAL LOW (ref 135–146)
Total Bilirubin: 0.6 mg/dL (ref 0.2–1.2)
Total Protein: 6.8 g/dL (ref 6.1–8.1)
eGFR: 45 mL/min/{1.73_m2} — ABNORMAL LOW (ref >=60)

## 2023-04-03 LAB — HIV-1 RNA QUANT-NO REFLEX-BLD
HIV 1 RNA Quant: <20 {copies}/mL — ABNORMAL HIGH
HIV-1 RNA Quant, Log: <1.3 {Log_copies}/mL — ABNORMAL HIGH

## 2023-04-03 LAB — LIPID PANEL
Cholesterol: 136 mg/dL (ref <200)
HDL: 43 mg/dL (ref >=40)
LDL Cholesterol (Calc): 66 mg/dL
Non-HDL Cholesterol (Calc): 93 mg/dL (ref <130)
Total CHOL/HDL Ratio: 3.2 (calc) (ref <5.0)
Triglycerides: 208 mg/dL — ABNORMAL HIGH (ref <150)

## 2023-04-03 LAB — HEPATITIS B DNA, ULTRAQUANTITATIVE, PCR
Hepatitis B DNA: NOT DETECTED [IU]/mL
Hepatitis B virus DNA: NOT DETECTED {Log_IU}/mL

## 2023-04-03 LAB — HEPATITIS B SURFACE ANTIGEN: Hepatitis B Surface Ag: REACTIVE — AB

## 2023-04-07 ENCOUNTER — Encounter: Payer: Self-pay | Admitting: Emergency Medicine

## 2023-04-08 ENCOUNTER — Other Ambulatory Visit: Payer: Self-pay

## 2023-04-08 DIAGNOSIS — Z122 Encounter for screening for malignant neoplasm of respiratory organs: Secondary | ICD-10-CM

## 2023-04-08 DIAGNOSIS — Z87891 Personal history of nicotine dependence: Secondary | ICD-10-CM

## 2023-04-12 ENCOUNTER — Ambulatory Visit (INDEPENDENT_AMBULATORY_CARE_PROVIDER_SITE_OTHER): Payer: Medicare Other | Admitting: Adult Health

## 2023-04-12 ENCOUNTER — Encounter: Payer: Self-pay | Admitting: Adult Health

## 2023-04-12 DIAGNOSIS — Z87891 Personal history of nicotine dependence: Secondary | ICD-10-CM

## 2023-04-12 NOTE — Progress Notes (Signed)
  Virtual Visit via Telephone Note  I connected with Dale Mcdonald , 04/12/23 9:44 AM by a telemedicine application and verified that I am speaking with the correct person using two identifiers.  Location: Patient: home Provider: home   I discussed the limitations of evaluation and management by telemedicine and the availability of in person appointments. The patient expressed understanding and agreed to proceed.   Shared Decision Making Visit Lung Cancer Screening Program [phone])   Eligibility: 72 y.o. Pack Years Smoking History Calculation = 59 pack years (# packs/per year x # years smoked) Recent History of coughing up blood  no Unexplained weight loss? no ( >Than 15 pounds within the last 6 months ) Prior History Lung / other cancer no (Diagnosis within the last 5 years already requiring surveillance chest CT Scans). Smoking Status Former Smoker Former Smokers: Years since quit: 6 years  Quit Date: 2018  Visit Components: Discussion included one or more decision making aids. YES Discussion included risk/benefits of screening. YES Discussion included potential follow up diagnostic testing for abnormal scans. YES Discussion included meaning and risk of over diagnosis. YES Discussion included meaning and risk of False Positives. YES Discussion included meaning of total radiation exposure. YES  Counseling Included: Importance of adherence to annual lung cancer LDCT screening. YES Impact of comorbidities on ability to participate in the program. YES Ability and willingness to under diagnostic treatment. YES  Smoking Cessation Counseling: Former Smokers:  Discussed the importance of maintaining cigarette abstinence. yes Diagnosis Code: Personal History of Nicotine Dependence. Q25.956 Information about tobacco cessation classes and interventions provided to patient. Yes Patient provided with "ticket" for LDCT Scan. yes Written Order for Lung Cancer Screening with LDCT  placed in Epic. Yes (CT Chest Lung Cancer Screening Low Dose W/O CM) [id number]  Z12.2-Screening of respiratory organs Z87.891-Personal history of nicotine dependence   Danford Bad 04/12/23

## 2023-04-12 NOTE — Patient Instructions (Signed)
 Thank you for participating in the  Lung Cancer Screening Program. It was our pleasure to meet you today. We will call you with the results of your scan within the next few days. Your scan will be assigned a Lung RADS category score by the physicians reading the scans.  This Lung RADS score determines follow up scanning.  See below for description of categories, and follow up screening recommendations. We will be in touch to schedule your follow up screening annually or based on recommendations of our providers. We will fax a copy of your scan results to your Primary Care Physician, or the physician who referred you to the program, to ensure they have the results. Please call the office if you have any questions or concerns regarding your scanning experience or results.  Our office number is [phone]. Please speak with Abigail Miyamoto, RN., Karlton Lemon RN, or Pietro Cassis RN. They are  our Lung Cancer Screening RN.'s If They are unavailable when you call, Please leave a message on the voice mail. We will return your call at our earliest convenience.This voice mail is monitored several times a day.  Remember, if your scan is normal, we will scan you annually as long as you continue to meet the criteria for the program. (Age 71-80, Current smoker or smoker who has quit within the last 15 years). If you are a smoker, remember, quitting is the single most powerful action that you can take to decrease your risk of lung cancer and other pulmonary, breathing related problems. We know quitting is hard, and we are here to help.  Please let us know if there is anything we can do to help you meet your goal of quitting. If you are a former smoker, Counselling psychologist. We are proud of you! Remain smoke free! Remember you can refer friends or family members through the number above.  We will screen them to make sure they meet criteria for the program. Thank you for helping [place] take better care of you  by participating in Lung Screening.   Lung RADS Categories:  Lung RADS 1: no nodules or definitely non-concerning nodules.  Recommendation is for a repeat annual scan in 12 months.  Lung RADS 2:  nodules that are non-concerning in appearance and behavior with a very low likelihood of becoming an active cancer. Recommendation is for a repeat annual scan in 12 months.  Lung RADS 3: nodules that are probably non-concerning , includes nodules with a low likelihood of becoming an active cancer.  Recommendation is for a 40-month repeat screening scan. Often noted after an upper respiratory illness. We will be in touch to make sure you have no questions, and to schedule your 71-month scan.  Lung RADS 4 A: nodules with concerning findings, recommendation is most often for a follow up scan in 3 months or additional testing based on our provider's assessment of the scan. We will be in touch to make sure you have no questions and to schedule the recommended 3 month follow up scan.  Lung RADS 4 B:  indicates findings that are concerning. We will be in touch with you to schedule additional diagnostic testing based on our provider's  assessment of the scan.  You can receive free nicotine replacement therapy ( patches, gum or mints) by calling 1-800-QUIT NOW. Please call so we can get you on the path to becoming  a non-smoker. I know it is hard, but you can do this!  Other options for assistance in  smoking cessation ( As covered by your insurance benefits)  Hypnosis for smoking cessation  [employer] [phone]  Acupuncture for smoking cessation  [employer] [phone]

## 2023-04-14 ENCOUNTER — Ambulatory Visit: Payer: Medicare Other | Admitting: Infectious Disease

## 2023-04-14 ENCOUNTER — Ambulatory Visit
Admission: RE | Admit: 2023-04-14 | Discharge: 2023-04-14 | Disposition: A | Discharge status: Home / Self Care | Payer: Medicare Other | Source: Ambulatory Visit | Attending: Family Medicine | Admitting: Family Medicine

## 2023-04-14 ENCOUNTER — Other Ambulatory Visit: Payer: Self-pay

## 2023-04-14 ENCOUNTER — Encounter: Payer: Self-pay | Admitting: Infectious Disease

## 2023-04-14 VITALS — BP 110/72 | HR 78 | Temp 98.3°F | Ht 70.0 in | Wt 190.0 lb

## 2023-04-14 DIAGNOSIS — Z0000 Encounter for general adult medical examination without abnormal findings: Secondary | ICD-10-CM

## 2023-04-14 DIAGNOSIS — E785 Hyperlipidemia, unspecified: Secondary | ICD-10-CM | POA: Diagnosis not present

## 2023-04-14 DIAGNOSIS — Z7185 Encounter for immunization safety counseling: Secondary | ICD-10-CM

## 2023-04-14 DIAGNOSIS — K760 Fatty (change of) liver, not elsewhere classified: Secondary | ICD-10-CM

## 2023-04-14 DIAGNOSIS — Z953 Presence of xenogenic heart valve: Secondary | ICD-10-CM | POA: Diagnosis not present

## 2023-04-14 DIAGNOSIS — B20 Human immunodeficiency virus [HIV] disease: Secondary | ICD-10-CM | POA: Diagnosis not present

## 2023-04-14 DIAGNOSIS — R9430 Abnormal result of cardiovascular function study, unspecified: Secondary | ICD-10-CM | POA: Diagnosis not present

## 2023-04-14 DIAGNOSIS — Z951 Presence of aortocoronary bypass graft: Secondary | ICD-10-CM

## 2023-04-14 DIAGNOSIS — Z87891 Personal history of nicotine dependence: Secondary | ICD-10-CM | POA: Diagnosis not present

## 2023-04-14 DIAGNOSIS — I509 Heart failure, unspecified: Secondary | ICD-10-CM | POA: Diagnosis not present

## 2023-04-14 DIAGNOSIS — Z122 Encounter for screening for malignant neoplasm of respiratory organs: Secondary | ICD-10-CM | POA: Diagnosis not present

## 2023-04-14 DIAGNOSIS — J439 Emphysema, unspecified: Secondary | ICD-10-CM | POA: Diagnosis not present

## 2023-04-14 DIAGNOSIS — B182 Chronic viral hepatitis C: Secondary | ICD-10-CM

## 2023-04-14 DIAGNOSIS — IMO0002 Reserved for concepts with insufficient information to code with codable children: Secondary | ICD-10-CM

## 2023-04-14 DIAGNOSIS — R635 Abnormal weight gain: Secondary | ICD-10-CM

## 2023-04-14 DIAGNOSIS — J449 Chronic obstructive pulmonary disease, unspecified: Secondary | ICD-10-CM | POA: Insufficient documentation

## 2023-04-14 DIAGNOSIS — R945 Abnormal results of liver function studies: Secondary | ICD-10-CM | POA: Diagnosis not present

## 2023-04-14 HISTORY — DX: Heart failure, unspecified: I50.9

## 2023-04-14 HISTORY — DX: Encounter for immunization safety counseling: Z71.85

## 2023-04-14 MED ORDER — BIKTARVY 50-200-25 MG PO TABS: 1.0000 | ORAL_TABLET | Freq: Every day | ORAL | 11 refills | Status: DC

## 2023-04-14 NOTE — Progress Notes (Signed)
Subjective:  Chief complaint follow-up for HIV he is on medications  Patient ID: Dale Mcdonald, male    DOB: [birth year], 72 y.o.   MRN: [id number]  HPI  Discussed the use of AI scribe software for clinical note transcription with the patient, who gave verbal consent to proceed.  History of Present Illness   Dale Mcdonald, a patient with a history of HIV, Hepatitis B, and fatty liver, presents for a routine follow-up. He is currently on Biktarvy for HIV management, with a recent viral load of less than 20 and a CD4 count of 578, indicating effective control of the HIV. He also has Hepatitis B, with no current treatment plan in place.  Dale Mcdonald has been exercising regularly, reporting improvements in his heart health. He was previously on the brink of needing a heart transplant but has seen significant improvements. 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  He has been on a weight loss regimen, losing 12 pounds in the past four months. He attributes this weight loss to a medication (Ozepmic)he has been taking, [place]. Despite this weight loss, he expresses frustration with persistent abdominal fat, which he believes may be more related to genetics than his HIV medication.  Dale Mcdonald is also on a regimen of other medications, including Lipitor for cholesterol management, albuterol (which he has not used in about a year), Trelegy for lung health, carvedilol for heart health, Marcelline Deist, Entresto, potassium supplements, pantoprazole for heartburn, and Valtrex for herpes prevention.       Past Medical History:  Diagnosis Date   Anemia 04/07/2021   Anxiety 03/29/2017   Cirrhosis (HCC)    Heart murmur    Hepatitis    B   HIV (human immunodeficiency virus infection) (HCC)    Hx of adenomatous polyp of colon 02/08/2018    Hyperglycemia 04/17/2020   Hyperlipidemia    Hypertension    NAFLD (nonalcoholic fatty liver disease) 4/74/2595   Cause likely abdominal obesity/metabolic syndrome plus Biktarvy Hepatic elastography with high likelihood normal i.e. no cirrhosis 2022   Orthostatic dizziness 04/07/2021   Palpitations     Past Surgical History:  Procedure Laterality Date   CATARACT EXTRACTION W/PHACO Left 11/11/2017   Procedure: CATARACT EXTRACTION PHACO AND INTRAOCULAR LENS PLACEMENT (IOC);  Surgeon: Galen Manila, MD;  Location: [facility] ORS;  Service: Ophthalmology;  Laterality: Left;  [place] 00:59.3 AP% 19.5 CDE 11.54 Fluid pack lot # [id number]   CATARACT EXTRACTION W/PHACO Right 12/21/2017   Procedure: CATARACT EXTRACTION PHACO AND INTRAOCULAR LENS PLACEMENT (IOC);  Surgeon: Galen Manila, MD;  Location: [facility] ORS;  Service: Ophthalmology;  Laterality: Right;  [place] 00:40 AP% 13.5 CDE 5.50 Fluid pack lot $ [id number]   COLONOSCOPY     COLONOSCOPY WITH PROPOFOL N/A 02/08/2018   Procedure: COLONOSCOPY WITH PROPOFOL;  Surgeon: Wyline Mood, MD;  Location: [facility] ENDOSCOPY;  Service: Gastroenterology;  Laterality: N/A;   COLONOSCOPY WITH PROPOFOL N/A 06/03/2021   Procedure: COLONOSCOPY WITH PROPOFOL;  Surgeon: Toney Reil, MD;  Location: [facility] ENDOSCOPY;  Service: Gastroenterology;  Laterality: N/A;   CORONARY ARTERY BYPASS GRAFT  04/07/2018   EYE SURGERY     RETINA   HERNIA REPAIR     MITRAL VALVE REPLACEMENT  03/2018   bovine--with CABG    No family history on file.  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  Tobacco Use   Smoking status: Some Days    Current packs/day: 2.00    Average packs/day: 2.0 packs/day for 40.0 years (80.0  ttl pk-yrs)    Types: Cigarettes, E-cigarettes    Passive exposure: Past   Smokeless tobacco: Never   Tobacco comments:    Quit 05/25/2017/     States vapes occasionally (04/08/22)  Substance and Sexual Activity   Alcohol use: Yes    Comment: States rare - a beer a month   Drug use: No   Sexual activity: Not on file    Comment: declined condoms  Other Topics Concern   Not on file  Social History Narrative   Retired and widowed   1 son   Former smoker   3 caffeinated beverages a day   No alcohol no drug use no smokeless tobacco      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 [employer]      No living will   Would want son [name] to be Management consultant   Would accept resuscitation but no prolonged ventilation   Not sure about tube feeds--but definitely not long term   Social Determinants of Health   Financial Resource Strain: Medium Risk (07/16/2021)   Overall Financial Resource Strain (CARDIA)    Difficulty of Paying Living Expenses: Somewhat hard  Food Insecurity: No Food Insecurity (07/16/2021)   Hunger Vital Sign    Worried About Running Out of Food in the Last Year: Never true    Ran Out of Food in the Last Year: Never true  Transportation Needs: Not on file  Physical Activity: Not on file  Stress: Not on file  Social Connections: Not on file    Allergies  Allergen Reactions   Magnesium Nausea Only     Current Outpatient Medications:    albuterol (PROVENTIL HFA) 108 (90 Base) MCG/ACT inhaler, Inhale into the lungs every 6 (six) hours as needed for wheezing or shortness of breath., Disp: , Rfl:    atorvastatin (LIPITOR) 20 MG tablet, TAKE 1 TABLET BY MOUTH ONCE  DAILY, Disp: 100 tablet, Rfl: 3   BIKTARVY 50-200-25 MG TABS tablet, TAKE 1 TABLET BY MOUTH DAILY, Disp: 30 tablet, Rfl: 1   carvedilol (COREG) 6.25 MG tablet, Take 6.25 mg by mouth 2 (two) times daily with a meal., Disp: , Rfl:    dapagliflozin  propanediol (FARXIGA) 10 MG TABS tablet, Take 5 mg by mouth daily., Disp: , Rfl:    ENTRESTO 97-103 MG, Take 1 tablet by mouth 2 (two) times daily., Disp: , Rfl:    fluconazole (DIFLUCAN) 150 MG tablet, Take 1 tablet (150 mg total) by mouth once a week. prn, Disp: 12 tablet, Rfl: 3   KLOR-CON M20 20 MEQ tablet, Take 20 mEq by mouth daily as needed., Disp: , Rfl:    Melatonin 5 MG TABS, Take 5 mg by mouth at bedtime. , Disp: , Rfl:    Multiple Vitamin (MULTI-VITAMIN) tablet, Take by mouth., Disp: , Rfl:    pantoprazole (PROTONIX) 40 MG tablet, TAKE 1 TABLET BY MOUTH DAILY, Disp: 100 tablet, Rfl: 3  spironolactone (ALDACTONE) 25 MG tablet, Take 25 mg by mouth daily., Disp: , Rfl:    torsemide (DEMADEX) 20 MG tablet, 10 mg every other day., Disp: , Rfl:    traZODone (DESYREL) 50 MG tablet, TAKE 1 TO 2 TABLETS BY MOUTH AT  BEDTIME, Disp: 180 tablet, Rfl: 3   TRELEGY ELLIPTA 100-62.5-25 MCG/ACT AEPB, Inhale 1 puff into the lungs daily., Disp: , Rfl:    valACYclovir (VALTREX) 500 MG tablet, Take 1 tablet (500 mg total) by mouth daily as needed (for fever blisters.)., Disp: 90 tablet, Rfl: 3    Review of Systems  Constitutional:  Negative for activity change, appetite change, chills, diaphoresis, fatigue, fever and unexpected weight change.  HENT:  Negative for congestion, rhinorrhea, sinus pressure, sneezing, sore throat and trouble swallowing.   Eyes:  Negative for photophobia and visual disturbance.  Respiratory:  Negative for cough, chest tightness, shortness of breath, wheezing and stridor.   Cardiovascular:  Negative for chest pain, palpitations and leg swelling.  Gastrointestinal:  Negative for abdominal distention, abdominal pain, anal bleeding, blood in stool, constipation, diarrhea, nausea and vomiting.  Genitourinary:  Negative for difficulty urinating, dysuria, flank pain and hematuria.  Musculoskeletal:  Negative for arthralgias, back pain, gait problem, joint swelling and myalgias.   Skin:  Negative for color change, pallor, rash and wound.  Neurological:  Negative for dizziness, tremors, weakness and light-headedness.  Hematological:  Negative for adenopathy. Does not bruise/bleed easily.  Psychiatric/Behavioral:  Negative for agitation, behavioral problems, confusion, decreased concentration, dysphoric mood and sleep disturbance.        Objective:   Physical Exam Constitutional:      Appearance: He is well-developed.  HENT:     Head: Normocephalic and atraumatic.  Eyes:     Conjunctiva/sclera: Conjunctivae normal.  Cardiovascular:     Rate and Rhythm: Normal rate and regular rhythm.  Pulmonary:     Effort: Pulmonary effort is normal. No respiratory distress.     Breath sounds: No wheezing.  Abdominal:     General: There is no distension.     Palpations: Abdomen is soft.  Musculoskeletal:        General: No tenderness. Normal range of motion.     Cervical back: Normal range of motion and neck supple.  Skin:    General: Skin is warm and dry.     Coloration: Skin is not pale.     Findings: No erythema or rash.  Neurological:     General: No focal deficit present.     Mental Status: He is alert and oriented to person, place, and time.  Psychiatric:        Mood and Affect: Mood normal.        Behavior: Behavior normal.        Thought Content: Thought content normal.        Judgment: Judgment normal.           Assessment & Plan:   Assessment and Plan    HIV Viral load less than 20, CD4 count 578. Stable on Biktarvy. -Continue Biktarvy.  Hepatitis B Surface antigen positive. Discussed potential study for treatment. -Check if Hepatitis B study is still open and if patient is eligible. --check RUQ [place]  Hyperlipidemia LDL 66, on Lipitor. -Continue Lipitor.  Weight gain Patient has concerns about weight gain, possibly related to Biktarvy. Patient has started Ozempic and has lost 12 pounds in 4 months. -Continue current regimen and monitor  weight.  Hepatic steatosis History of fatty liver.  Patient has lost weight which should help with this condition. -Order ultrasound of liver to monitor for potential liver cancer.  Smoking history Patient has a history of smoking and a family history of lung cancer. Patient is scheduled for a CT scan for lung cancer screening. -Continue with scheduled CT scan.    Vaccine counseling: he is up to date on all vaccines Follow-up Juanell Fairly program requires visits every 10 months. -Schedule next appointment in 10 months.

## 2023-04-19 ENCOUNTER — Ambulatory Visit
Admission: RE | Admit: 2023-04-19 | Discharge: 2023-04-19 | Disposition: A | Discharge status: Home / Self Care | Payer: Medicare Other | Source: Ambulatory Visit | Attending: Infectious Disease | Admitting: Infectious Disease

## 2023-04-19 DIAGNOSIS — B182 Chronic viral hepatitis C: Secondary | ICD-10-CM

## 2023-04-19 DIAGNOSIS — K8020 Calculus of gallbladder without cholecystitis without obstruction: Secondary | ICD-10-CM | POA: Diagnosis not present

## 2023-04-19 DIAGNOSIS — K7460 Unspecified cirrhosis of liver: Secondary | ICD-10-CM | POA: Diagnosis not present

## 2023-05-07 ENCOUNTER — Other Ambulatory Visit: Payer: Self-pay | Admitting: Acute Care

## 2023-05-07 DIAGNOSIS — Z122 Encounter for screening for malignant neoplasm of respiratory organs: Secondary | ICD-10-CM

## 2023-05-07 DIAGNOSIS — Z87891 Personal history of nicotine dependence: Secondary | ICD-10-CM

## 2023-07-03 ENCOUNTER — Other Ambulatory Visit: Payer: Self-pay | Admitting: Internal Medicine

## 2023-07-13 ENCOUNTER — Other Ambulatory Visit (HOSPITAL_COMMUNITY): Payer: Self-pay

## 2023-07-14 ENCOUNTER — Telehealth: Payer: Self-pay

## 2023-07-14 NOTE — Telephone Encounter (Signed)
 [facility] Patient Advocate Encounter   I was successful in securing patient a $ 2,000.00 grant from [employer] to provide copayment coverage for Biktarvy.  The patient's out of pocket cost will be $5.00 monthly.     I have spoken with the patient.            Dates of Eligibility: 07/13/23 through 05/24/24  Patient knows to call the office with questions or concerns.  Clearance Coots, CPhT [facility]ty [facility]acy Patient [facility] Phone: [phone] Fax:  [phone]

## 2023-07-26 ENCOUNTER — Other Ambulatory Visit: Payer: Self-pay | Admitting: Internal Medicine

## 2023-08-18 DIAGNOSIS — I5032 Chronic diastolic (congestive) heart failure: Secondary | ICD-10-CM | POA: Diagnosis not present

## 2023-08-18 DIAGNOSIS — R0602 Shortness of breath: Secondary | ICD-10-CM | POA: Diagnosis not present

## 2023-09-15 DIAGNOSIS — H35371 Puckering of macula, right eye: Secondary | ICD-10-CM | POA: Diagnosis not present

## 2023-09-15 DIAGNOSIS — Z961 Presence of intraocular lens: Secondary | ICD-10-CM | POA: Diagnosis not present

## 2023-09-15 DIAGNOSIS — H43811 Vitreous degeneration, right eye: Secondary | ICD-10-CM | POA: Diagnosis not present

## 2023-10-03 ENCOUNTER — Other Ambulatory Visit: Payer: Self-pay | Admitting: Internal Medicine

## 2023-10-03 DIAGNOSIS — B009 Herpesviral infection, unspecified: Secondary | ICD-10-CM

## 2023-10-07 NOTE — Progress Notes (Signed)
 The 10-year ASCVD risk score (Arnett DK, et al., 2019) is: 23.1%   Values used to calculate the score:     Age: 73 years     Sex: Male     Is Non-Hispanic African American: No     Diabetic: No     Tobacco smoker: No     Systolic Blood Pressure: 134 mmHg     Is BP treated: Yes     HDL Cholesterol: 43 mg/dL     Total Cholesterol: 136 mg/dL  Currently prescribed atorvastatin  20 mg.   Alayah Knouff, BSN, RN

## 2023-10-24 ENCOUNTER — Encounter: Payer: Self-pay | Admitting: Internal Medicine

## 2023-10-25 ENCOUNTER — Other Ambulatory Visit: Payer: Self-pay | Admitting: Internal Medicine

## 2023-10-25 DIAGNOSIS — B009 Herpesviral infection, unspecified: Secondary | ICD-10-CM

## 2023-10-25 MED ORDER — ATORVASTATIN CALCIUM 20 MG PO TABS: 20.0000 mg | ORAL_TABLET | Freq: Every day | ORAL | 3 refills | Status: DC

## 2023-12-01 DIAGNOSIS — J449 Chronic obstructive pulmonary disease, unspecified: Secondary | ICD-10-CM | POA: Diagnosis not present

## 2023-12-08 ENCOUNTER — Other Ambulatory Visit
Admission: RE | Admit: 2023-12-08 | Discharge: 2023-12-08 | Disposition: A | Discharge status: Home / Self Care | Source: Ambulatory Visit | Attending: Pulmonary Disease | Admitting: Pulmonary Disease

## 2023-12-08 DIAGNOSIS — I5189 Other ill-defined heart diseases: Secondary | ICD-10-CM | POA: Diagnosis not present

## 2023-12-08 DIAGNOSIS — R0689 Other abnormalities of breathing: Secondary | ICD-10-CM | POA: Insufficient documentation

## 2023-12-08 DIAGNOSIS — R0600 Dyspnea, unspecified: Secondary | ICD-10-CM | POA: Insufficient documentation

## 2023-12-08 DIAGNOSIS — R911 Solitary pulmonary nodule: Secondary | ICD-10-CM | POA: Diagnosis not present

## 2023-12-08 DIAGNOSIS — Z87891 Personal history of nicotine dependence: Secondary | ICD-10-CM | POA: Diagnosis not present

## 2023-12-08 DIAGNOSIS — J449 Chronic obstructive pulmonary disease, unspecified: Secondary | ICD-10-CM | POA: Diagnosis not present

## 2023-12-08 LAB — D-DIMER, QUANTITATIVE: D-Dimer, Quant: 0.39 ug{FEU}/mL (ref 0.00–0.50)

## 2023-12-30 ENCOUNTER — Other Ambulatory Visit: Payer: Self-pay | Admitting: Internal Medicine

## 2023-12-30 ENCOUNTER — Ambulatory Visit

## 2023-12-30 VITALS — BP 110/72 | Ht 70.0 in | Wt 181.0 lb

## 2023-12-30 DIAGNOSIS — Z0000 Encounter for general adult medical examination without abnormal findings: Secondary | ICD-10-CM

## 2023-12-30 NOTE — Patient Instructions (Signed)
 Mr. Burley , Thank you for taking time out of your busy schedule to complete your Annual Wellness Visit with me. I enjoyed our conversation and look forward to speaking with you again next year. I, as well as your care team,  appreciate your ongoing commitment to your health goals. Please review the following plan we discussed and let me know if I can assist you in the future. Your Game plan/ To Do List    Referrals: If you haven't heard from the office you've been referred to, please reach out to them at the phone provided.   Follow up Visits: We will see or speak with you next year for your Next Medicare AWV with our clinical staff Have you seen your provider in the last 6 months (3 months if uncontrolled diabetes)? Yes  Clinician Recommendations:  Aim for 30 minutes of exercise or brisk walking, 6-8 glasses of water, and 5 servings of fruits and vegetables each day.       This is a list of the screenings recommended for you:  Health Maintenance  Topic Date Due   Hepatitis B Vaccine (1 of 3 - Risk 3-dose series) 07/23/2011   DTaP/Tdap/Td vaccine (2 - Td or Tdap) 07/26/2022   COVID-19 Vaccine (8 - 2024-25 season) 01/24/2023   Flu Shot  12/24/2023   Screening for Lung Cancer  04/13/2024   Medicare Annual Wellness Visit  12/29/2024   Colon Cancer Screening  06/03/2026   Pneumococcal Vaccine for age over 26  Completed   Hepatitis C Screening  Completed   Zoster (Shingles) Vaccine  Completed   HPV Vaccine  Aged Out   Meningitis B Vaccine  Aged Out    Advanced directives: (Copy Requested) Please bring a copy of your health care power of attorney and living will to the office to be added to your chart at your convenience. You can mail to [facility] [address] [place], [place] [address] or email to [email or web address] Advance Care Planning is important because it:  [x]  Makes sure you receive the medical care that is consistent with your values, goals, and  preferences  [x]  It provides guidance to your family and loved ones and reduces their decisional burden about whether or not they are making the right decisions based on your wishes.  Follow the link provided in your after visit summary or read over the paperwork we have mailed to you to help you started getting your Advance Directives in place. If you need assistance in completing these, please reach out to us  so that we can help you!  See attachments for Preventive Care and Fall Prevention Tips.

## 2023-12-30 NOTE — Progress Notes (Signed)
 Because this visit was a virtual/telehealth visit,  certain criteria was not obtained, such a blood pressure, CBG if applicable, and timed get up and go. Any medications not marked as taking were not mentioned during the medication reconciliation part of the visit. Any vitals not documented were not able to be obtained due to this being a telehealth visit or patient was unable to self-report a recent blood pressure reading due to a lack of equipment at home via telehealth. Vitals that have been documented are verbally provided by the patient.  This visit was performed by a medical professional under my direct supervision. I was immediately available for consultation/collaboration. I have reviewed and agree with the Annual Wellness Visit documentation.  Subjective:   Dale Mcdonald is a 73 y.o. who presents for a Medicare Wellness preventive visit.  As a reminder, Annual Wellness Visits don't include a physical exam, and some assessments may be limited, especially if this visit is performed virtually. We may recommend an in-person follow-up visit with your provider if needed.  Visit Complete: Virtual I connected with  Dale Mcdonald on 12/30/23 by a audio enabled telemedicine application and verified that I am speaking with the correct person using two identifiers.  Patient Location: Home  Provider Location: Home Office  I discussed the limitations of evaluation and management by telemedicine. The patient expressed understanding and agreed to proceed.  Vital Signs: Because this visit was a virtual/telehealth visit, some criteria may be missing or patient reported. Any vitals not documented were not able to be obtained and vitals that have been documented are patient reported.  VideoDeclined- This patient declined Librarian, academic. Therefore the visit was completed with audio only.  Persons Participating in Visit: Patient.  AWV Questionnaire: No: Patient Medicare  AWV questionnaire was not completed prior to this visit.  Cardiac Risk Factors include: advanced age (>65men, >63 women);sedentary lifestyle;male gender;hypertension;Other (see comment), Risk factor comments: copd     Objective:    Today's Vitals   12/30/23 1317  BP: 110/72  Weight: 181 lb (82.1 kg)  Height: 5' 10 (1.778 m)   Body mass index is 25.97 kg/m.     12/30/2023    1:21 PM 06/03/2021    7:22 AM 02/08/2018    8:10 AM 12/21/2017    7:53 AM  Advanced Directives  Does Patient Have a Medical Advance Directive? Yes Yes No  No   Type of Estate agent of [place];Living will Healthcare Power of [place];Living will    Does patient want to make changes to medical advance directive? No - Patient declined     Copy of Healthcare Power of Attorney in Chart? No - copy requested No - copy requested    Would patient like information on creating a medical advance directive?   No - Patient declined  No - Patient declined      Data saved with a previous flowsheet row definition    Current Medications (verified) Outpatient Encounter Medications as of 12/30/2023  Medication Sig   albuterol  (PROVENTIL  HFA) 108 (90 Base) MCG/ACT inhaler Inhale into the lungs every 6 (six) hours as needed for wheezing or shortness of breath.   atorvastatin  (LIPITOR) 20 MG tablet Take 1 tablet (20 mg total) by mouth daily.   bictegravir-emtricitabine -tenofovir  AF (BIKTARVY ) 50-200-25 MG TABS tablet Take 1 tablet by mouth daily.   carvedilol  (COREG ) 6.25 MG tablet Take 6.25 mg by mouth 2 (two) times daily with a meal.   dapagliflozin propanediol  (FARXIGA )  10 MG TABS tablet Take 5 mg by mouth daily.   ENTRESTO 97-103 MG Take 1 tablet by mouth 2 (two) times daily.   fluconazole  (DIFLUCAN ) 150 MG tablet TAKE 1 TABLET BY MOUTH ONCE  WEEKLY AS NEEDED   KLOR-CON  M20 20 MEQ tablet Take 20 mEq by mouth daily as needed.   Melatonin 5 MG TABS Take 5 mg by mouth at bedtime.    Multiple Vitamin  (MULTI-VITAMIN) tablet Take by mouth.   pantoprazole  (PROTONIX ) 40 MG tablet TAKE 1 TABLET BY MOUTH DAILY   spironolactone  (ALDACTONE ) 25 MG tablet Take 25 mg by mouth daily.   torsemide  (DEMADEX ) 20 MG tablet 10 mg every other day.   traZODone  (DESYREL ) 50 MG tablet TAKE 1 TO 2 TABLETS BY MOUTH AT  BEDTIME   TRELEGY ELLIPTA 100-62.5-25 MCG/ACT AEPB Inhale 1 puff into the lungs daily.   valACYclovir  (VALTREX ) 500 MG tablet TAKE 1 TABLET BY MOUTH DAILY AS  NEEDED FOR FEVER BLISTERS   No facility-administered encounter medications on file as of 12/30/2023.    Allergies (verified) Magnesium    History: Past Medical History:  Diagnosis Date   Anemia 04/07/2021   Anxiety 03/29/2017   CHF (congestive heart failure) (HCC) 04/14/2023   Cirrhosis (HCC)    COPD (chronic obstructive pulmonary disease) (HCC) 04/14/2023   Heart murmur    Hepatitis    B   HIV (human immunodeficiency virus infection) (HCC)    Hx of adenomatous polyp of colon 02/08/2018   Hyperglycemia 04/17/2020   Hyperlipidemia    Hypertension    NAFLD (nonalcoholic fatty liver disease) 97/85/7976   Cause likely abdominal obesity/metabolic syndrome plus Biktarvy  Hepatic elastography with high likelihood normal i.e. no cirrhosis 2022   Orthostatic dizziness 04/07/2021   Palpitations    Vaccine counseling 04/14/2023   Past Surgical History:  Procedure Laterality Date   CATARACT EXTRACTION W/PHACO Left 11/11/2017   Procedure: CATARACT EXTRACTION PHACO AND INTRAOCULAR LENS PLACEMENT (IOC);  Surgeon: Jaye Fallow, MD;  Location: [facility] ORS;  Service: Ophthalmology;  Laterality: Left;  US  00:59.3 AP% 19.5 CDE 11.54 Fluid pack lot # [id number]   CATARACT EXTRACTION W/PHACO Right 12/21/2017   Procedure: CATARACT EXTRACTION PHACO AND INTRAOCULAR LENS PLACEMENT (IOC);  Surgeon: Jaye Fallow, MD;  Location: [facility] ORS;  Service: Ophthalmology;  Laterality: Right;  US  00:40 AP% 13.5 CDE 5.50 Fluid pack lot $ [id number]    COLONOSCOPY     COLONOSCOPY WITH PROPOFOL  N/A 02/08/2018   Procedure: COLONOSCOPY WITH PROPOFOL ;  Surgeon: Therisa Bi, MD;  Location[facility] ENDOSCOPY;  Service: Gastroenterology;  Laterality: N/A;   COLONOSCOPY WITH PROPOFOL  N/A 06/03/2021   Procedure: COLONOSCOPY WITH PROPOFOL ;  Surgeon: Unk Corinn Skiff, MD;  Location: [facility] ENDOSCOPY;  Service: Gastroenterology;  Laterality: N/A;   CORONARY ARTERY BYPASS GRAFT  04/07/2018   EYE SURGERY     RETINA   HERNIA REPAIR     MITRAL VALVE REPLACEMENT  03/2018   bovine--with CABG   History reviewed. No pertinent family history. 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  Tobacco Use   Smoking status: Some Days    Current packs/day: 2.00    Average packs/day: 2.0 packs/day for 40.0 years (80.0 ttl pk-yrs)    Types: Cigarettes,  E-cigarettes    Passive exposure: Past   Smokeless tobacco: Never   Tobacco comments:    Quit 05/25/2017/     States vapes occasionally (04/08/22)  Substance and Sexual Activity   Alcohol use: Yes    Comment: States rare - a beer a month   Drug use: No   Sexual activity: Not on file    Comment: declined condoms  Other Topics Concern   Not on file  Social History Narrative   Retired and widowed   1 son   Former smoker   3 caffeinated beverages a day   No alcohol no drug use no smokeless tobacco      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 [employer]      No living will   Would want son Glendia to be Management consultant   Would accept resuscitation but no prolonged ventilation   Not sure about tube feeds--but definitely not long term   Social Drivers of Corporate investment banker Strain:  Medium Risk (12/30/2023)   Overall Financial Resource Strain (CARDIA)    Difficulty of Paying Living Expenses: Somewhat hard  Food Insecurity: No Food Insecurity (12/30/2023)   Hunger Vital Sign    Worried About Running Out of Food in the Last Year: Never true    Ran Out of Food in the Last Year: Never true  Transportation Needs: No Transportation Needs (12/30/2023)   PRAPARE - Administrator, Civil Service (Medical): No    Lack of Transportation (Non-Medical): No  Physical Activity: Sufficiently Active (12/30/2023)   Exercise Vital Sign    Days of Exercise per Week: 7 days    Minutes of Exercise per Session: 40 min  Stress: No Stress Concern Present (12/30/2023)   [employer] of Occupational Health - Occupational Stress Questionnaire    Feeling of Stress: Not at all  Social Connections: Moderately Integrated (12/30/2023)   Social Connection and Isolation Panel    Frequency of Communication with Friends and Family: More than three times a week    Frequency of Social Gatherings with Friends and Family: More than three times a week    Attends Religious Services: Never    Database administrator or Organizations: Yes    Attends Engineer, structural: More than 4 times per year    Marital Status: Married    Tobacco Counseling Ready to quit: Not Answered Counseling given: Not Answered Tobacco comments: Quit 05/25/2017/  States vapes occasionally (04/08/22)    Clinical Intake:  Pre-visit preparation completed: Yes  Pain : No/denies pain     BMI - recorded: 25.97 Nutritional Status: BMI 25 -29 Overweight Nutritional Risks: None Diabetes: No  Lab Results  Component Value Date   HGBA1C 6.4 (H) 04/17/2020   HGBA1C 6.2 (H) 05/10/2019   HGBA1C 6.3 (H) 03/08/2018     How often do you need to have someone help you when you read instructions, pamphlets, or other written materials from your doctor or pharmacy?: 1 - Never  Interpreter Needed?: No  Information  entered by :: Deone Leifheit,CMA   Activities of Daily Living     12/30/2023    1:20 PM  In your present state of health, do you have any difficulty performing the following activities:  Hearing? 0  Vision? 0  Difficulty concentrating or making decisions? 0  Walking or climbing stairs? 0  Dressing or bathing? 0  Doing errands, shopping? 0  Preparing Food and eating ? N  Using the Toilet? N  In the past six months, have you accidently leaked urine? Y  Do you have problems with loss of bowel control? N  Managing your Medications? N  Managing your Finances? N  Housekeeping or managing your Housekeeping? N    Patient Care Team: Jimmy Charlie FERNS, MD as PCP - General Fleeta Rothman, Jomarie SAILOR, MD as PCP - Infectious Diseases (Infectious Diseases) Fate Morna SAILOR, [facility] (Inactive) as Pharmacist (Pharmacist)  I have updated your Care Teams any recent Medical Services you may have received from other providers in the past year.     Assessment:   This is a routine wellness examination for [place].  Hearing/Vision screen Hearing Screening - Comments:: No difficulties  Vision Screening - Comments:: OTC readers    Goals Addressed             This Visit's Progress    Patient Stated       To maintain his currently lifestyle       Depression Screen     12/30/2023    1:22 PM 04/14/2023   10:11 AM 01/18/2023    3:36 PM 04/08/2022    9:31 AM 01/15/2022    4:13 PM 04/07/2021    9:30 AM 01/09/2021    2:57 PM  PHQ 2/9 Scores  PHQ - 2 Score 0 0 0 0 0 0 0  PHQ- 9 Score 2          Fall Risk     12/30/2023    1:21 PM 04/14/2023   10:11 AM 01/18/2023    3:36 PM 04/08/2022    9:22 AM 01/15/2022    4:12 PM  Fall Risk   Falls in the past year? 0 1 0 1 1  Number falls in past yr: 0 0 0 0 0  Comment    Pt states sycopal episode when he jumped up out of bed one night   Injury with Fall? 0 0 0 0 0  Risk for fall due to : No Fall Risks No Fall Risks No Fall Risks History of fall(s)    Follow up Falls evaluation completed Falls evaluation completed Falls evaluation completed Falls evaluation completed       Data saved with a previous flowsheet row definition    MEDICARE RISK AT HOME:  Medicare Risk at Home Any stairs in or around the home?: Yes If so, are there any without handrails?: No Home free of loose throw rugs in walkways, pet beds, electrical cords, etc?: Yes Adequate lighting in your home to reduce risk of falls?: Yes Life alert?: No Use of a cane, walker or w/c?: No Grab bars in the bathroom?: Yes Shower chair or bench in shower?: Yes Elevated toilet seat or a handicapped toilet?: Yes  TIMED UP AND GO:  Was the test performed?  No  Cognitive Function: 6CIT completed        12/30/2023    1:19 PM  6CIT Screen  What Year? 0 points  What month? 0 points  What time? 0 points  Count back from 20 0 points  Months in reverse 0 points  Repeat phrase 0 points  Total Score 0 points    Immunizations Immunization History  Administered Date(s) Administered   Fluad Quad(high Dose 65+) 03/22/2022   Hepatitis A 02/04/2009, 11/07/2009   Influenza Split 02/19/2011, 02/10/2012, 02/16/2013   Influenza Whole 02/17/2008, 02/06/2009, 02/11/2010   Influenza, High Dose Seasonal PF 03/18/2017, 03/27/2018, 03/08/2019  Influenza, Mdck, Trivalent,PF 6+ MOS(egg free) 03/10/2023   Influenza,inj,Quad PF,6+ Mos 01/22/2016   Influenza-Unspecified 02/10/2014, 03/18/2020, 02/18/2021   Moderna Covid-19 Fall Seasonal Vaccine 12yrs & older 04/04/2022   Moderna Covid-19 Vaccine Bivalent Booster 46yrs & up 02/18/2021   PFIZER Comirnaty(Gray Top)Covid-19 Tri-Sucrose Vaccine 06/22/2019, 07/14/2019, 01/26/2020, 08/10/2020   Pfizer Covid-19 Vaccine Bivalent Booster 54yrs & up 09/30/2021   Pneumococcal Conjugate-13 12/25/2015   Pneumococcal Polysaccharide-23 02/04/2009, 12/28/2016   Tdap 07/25/2012   Zoster Recombinant(Shingrix ) 01/11/2019, 03/31/2019   Zoster, Live 12/21/2013     Screening Tests Health Maintenance  Topic Date Due   Hepatitis B Vaccines (1 of 3 - Risk 3-dose series) 07/23/2011   DTaP/Tdap/Td (2 - Td or Tdap) 07/26/2022   COVID-19 Vaccine (8 - 2024-25 season) 01/24/2023   INFLUENZA VACCINE  12/24/2023   Lung Cancer Screening  04/13/2024   Medicare Annual Wellness (AWV)  12/29/2024   Colonoscopy  06/03/2026   Pneumococcal Vaccine: 50+ Years  Completed   Hepatitis C Screening  Completed   Zoster Vaccines- Shingrix   Completed   HPV VACCINES  Aged Out   Meningococcal B Vaccine  Aged Out    Health Maintenance  Health Maintenance Due  Topic Date Due   Hepatitis B Vaccines (1 of 3 - Risk 3-dose series) 07/23/2011   DTaP/Tdap/Td (2 - Td or Tdap) 07/26/2022   COVID-19 Vaccine (8 - 2024-25 season) 01/24/2023   INFLUENZA VACCINE  12/24/2023   Health Maintenance Items Addressed:patient will discuss   Additional Screening:  Vision Screening: Recommended annual ophthalmology exams for early detection of glaucoma and other disorders of the eye. Would you like a referral to an eye doctor? No    Dental Screening: Recommended annual dental exams for proper oral hygiene  Community Resource Referral / Chronic Care Management: CRR required this visit?  No   CCM required this visit?  No   Plan:    I have personally reviewed and noted the following in the patient's chart:   Medical and social history Use of alcohol, tobacco or illicit drugs  Current medications and supplements including opioid prescriptions. Patient is not currently taking opioid prescriptions. Functional ability and status Nutritional status Physical activity Advanced directives List of other physicians Hospitalizations, surgeries, and ER visits in previous 12 months Vitals Screenings to include cognitive, depression, and falls Referrals and appointments  In addition, I have reviewed and discussed with patient certain preventive protocols, quality metrics, and best  practice recommendations. A written personalized care plan for preventive services as well as general preventive health recommendations were provided to patient.   Lyle MARLA Right, [place]   12/30/2023   After Visit Summary: (MyChart) Due to this being a telephonic visit, the after visit summary with patients personalized plan was offered to patient via MyChart   Notes: Nothing significant to report at this time.

## 2024-01-19 ENCOUNTER — Ambulatory Visit: Admitting: Internal Medicine

## 2024-01-19 ENCOUNTER — Encounter: Payer: Self-pay | Admitting: Internal Medicine

## 2024-01-19 VITALS — BP 116/70 | HR 69 | Temp 97.9°F | Ht 70.0 in | Wt 189.0 lb

## 2024-01-19 DIAGNOSIS — Z0000 Encounter for general adult medical examination without abnormal findings: Secondary | ICD-10-CM

## 2024-01-19 DIAGNOSIS — J449 Chronic obstructive pulmonary disease, unspecified: Secondary | ICD-10-CM | POA: Diagnosis not present

## 2024-01-19 DIAGNOSIS — N1832 Chronic kidney disease, stage 3b: Secondary | ICD-10-CM | POA: Diagnosis not present

## 2024-01-19 DIAGNOSIS — B009 Herpesviral infection, unspecified: Secondary | ICD-10-CM | POA: Diagnosis not present

## 2024-01-19 DIAGNOSIS — B20 Human immunodeficiency virus [HIV] disease: Secondary | ICD-10-CM

## 2024-01-19 DIAGNOSIS — B181 Chronic viral hepatitis B without delta-agent: Secondary | ICD-10-CM

## 2024-01-19 DIAGNOSIS — I5022 Chronic systolic (congestive) heart failure: Secondary | ICD-10-CM

## 2024-01-19 MED ORDER — ATORVASTATIN CALCIUM 20 MG PO TABS: 20.0000 mg | ORAL_TABLET | Freq: Every day | ORAL | 3 refills | Status: AC

## 2024-01-19 MED ORDER — PANTOPRAZOLE SODIUM 40 MG PO TBEC: 40.0000 mg | DELAYED_RELEASE_TABLET | Freq: Every day | ORAL | 2 refills | Status: AC

## 2024-01-19 MED ORDER — FLUCONAZOLE 150 MG PO TABS: 150.0000 mg | ORAL_TABLET | ORAL | 3 refills | Status: DC

## 2024-01-19 MED ORDER — VALACYCLOVIR HCL 500 MG PO TABS: 500.0000 mg | ORAL_TABLET | Freq: Every day | ORAL | 2 refills | Status: DC | PRN

## 2024-01-19 NOTE — Assessment & Plan Note (Signed)
 GFR variable Is on the valsartan

## 2024-01-19 NOTE — Assessment & Plan Note (Signed)
 Followed by ID Biktavy treats this also

## 2024-01-19 NOTE — Assessment & Plan Note (Signed)
 Doing better on the trelegy

## 2024-01-19 NOTE — Progress Notes (Signed)
 Subjective:    Patient ID: Dale Mcdonald, male    DOB: 1950-07-29, 73 y.o.   MRN: [id number]  HPI Here for physical  Doing okay HIV still controlled  Trying to lose some weight Actual tried ozempic briefly--lost 10# Is exercising regularly---bike/treadmill. Needs more resistance work  Heart okay Tends to be cold since heart trouble EF has gone up ---almost to 50 Adjusts torsemide ---if fluid down No chest pain No recent dizziness and no syncope No palpitations  COPD stable Breathing is okay No regular cough--just AM cough to clear mucus Cut out all the vaping now  GFR has been 45 -54 in recent years--but back down to 31 recently  Current Outpatient Medications on File Prior to Visit  Medication Sig Dispense Refill   albuterol  (PROVENTIL  HFA) 108 (90 Base) MCG/ACT inhaler Inhale into the lungs every 6 (six) hours as needed for wheezing or shortness of breath.     atorvastatin  (LIPITOR) 20 MG tablet Take 1 tablet (20 mg total) by mouth daily. 100 tablet 3   bictegravir-emtricitabine -tenofovir  AF (BIKTARVY ) 50-200-25 MG TABS tablet Take 1 tablet by mouth daily. 30 tablet 11   dapagliflozin propanediol  (FARXIGA ) 10 MG TABS tablet Take 5 mg by mouth daily.     ENTRESTO 97-103 MG Take 1 tablet by mouth 2 (two) times daily.     fluconazole  (DIFLUCAN ) 150 MG tablet TAKE 1 TABLET BY MOUTH ONCE  WEEKLY AS NEEDED 15 tablet 0   KLOR-CON  M20 20 MEQ tablet Take 20 mEq by mouth daily as needed.     Melatonin 5 MG TABS Take 5 mg by mouth at bedtime.      Multiple Vitamin (MULTI-VITAMIN) tablet Take by mouth.     pantoprazole  (PROTONIX ) 40 MG tablet TAKE 1 TABLET BY MOUTH DAILY 100 tablet 2   spironolactone  (ALDACTONE ) 25 MG tablet Take 25 mg by mouth daily.     torsemide  (DEMADEX ) 20 MG tablet 10 mg every other day.     traZODone  (DESYREL ) 50 MG tablet TAKE 1 TO 2 TABLETS BY MOUTH AT  BEDTIME 180 tablet 3   TRELEGY ELLIPTA 100-62.5-25 MCG/ACT AEPB Inhale 1 puff into the lungs daily.      valACYclovir  (VALTREX ) 500 MG tablet TAKE 1 TABLET BY MOUTH DAILY AS  NEEDED FOR FEVER BLISTERS 100 tablet 2   No current facility-administered medications on file prior to visit.    Allergies  Allergen Reactions   Magnesium  Nausea Only    Past Medical History:  Diagnosis Date   Anemia 04/07/2021   Anxiety 03/29/2017   CHF (congestive heart failure) (HCC) 04/14/2023   Cirrhosis (HCC)    COPD (chronic obstructive pulmonary disease) (HCC) 04/14/2023   Heart murmur    Hepatitis    B   HIV (human immunodeficiency virus infection) (HCC)    Hx of adenomatous polyp of colon 02/08/2018   Hyperglycemia 04/17/2020   Hyperlipidemia    Hypertension    NAFLD (nonalcoholic fatty liver disease) 97/85/7976   Cause likely abdominal obesity/metabolic syndrome plus Biktarvy  Hepatic elastography with high likelihood normal i.e. no cirrhosis 2022   Orthostatic dizziness 04/07/2021   Palpitations    Vaccine counseling 04/14/2023    Past Surgical History:  Procedure Laterality Date   CATARACT EXTRACTION W/PHACO Left 11/11/2017   Procedure: CATARACT EXTRACTION PHACO AND INTRAOCULAR LENS PLACEMENT (IOC);  Surgeon: Jaye Fallow, MD;  Location: [facility] ORS;  Service: Ophthalmology;  Laterality: Left;  US  00:59.3 AP% 19.5 CDE 11.54 Fluid pack lot # [id number]   CATARACT  EXTRACTION W/PHACO Right 12/21/2017   Procedure: CATARACT EXTRACTION PHACO AND INTRAOCULAR LENS PLACEMENT (IOC);  Surgeon: Jaye Fallow, MD;  Location: [facility] ORS;  Service: Ophthalmology;  Laterality: Right;  US  00:40 AP% 13.5 CDE 5.50 Fluid pack lot $ [id number]   COLONOSCOPY     COLONOSCOPY WITH PROPOFOL  N/A 02/08/2018   Procedure: COLONOSCOPY WITH PROPOFOL ;  Surgeon: Therisa Bi, MD;  Location: [facility] ENDOSCOPY;  Service: Gastroenterology;  Laterality: N/A;   COLONOSCOPY WITH PROPOFOL  N/A 06/03/2021   Procedure: COLONOSCOPY WITH PROPOFOL ;  Surgeon: Unk Corinn Skiff, MD;  Location: [name] ENDOSCOPY;  Service: Gastroenterology;   Laterality: N/A;   CORONARY ARTERY BYPASS GRAFT  04/07/2018   EYE SURGERY     RETINA   HERNIA REPAIR     MITRAL VALVE REPLACEMENT  03/2018   bovine--with CABG    History reviewed. No pertinent family history.  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  Tobacco Use   Smoking status: Former    Current packs/day: 2.00    Average packs/day: 2.0 packs/day for 40.0 years (80.0 ttl pk-yrs)    Types: E-cigarettes, Cigarettes    Passive exposure: Past   Smokeless tobacco: Never   Tobacco comments:    Quit 05/25/2017/     States vapes occasionally (04/08/22)  Substance and Sexual Activity   Alcohol use: Yes    Comment: States rare - a beer a month   Drug use: No   Sexual activity: Not on file    Comment: declined condoms  Other Topics Concern   Not on file  Social History Narrative   Retired and widowed   1 son   Former smoker   3 caffeinated beverages a day   No alcohol no drug use no smokeless tobacco      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 [employer]      No living will   Would want son Glendia to be Management consultant   Would accept resuscitation but no prolonged ventilation   Not sure about tube feeds--but definitely not long term   Social Drivers of Corporate investment banker Strain: Medium Risk (12/30/2023)   Overall Financial Resource Strain (CARDIA)    Difficulty of Paying Living Expenses: Somewhat hard  Food Insecurity: No Food Insecurity (12/30/2023)   Hunger Vital Sign    Worried About Running Out of Food in the Last Year: Never true    Ran Out of Food in the Last Year: Never true  Transportation Needs: No Transportation Needs (12/30/2023)    PRAPARE - Administrator, Civil Service (Medical): No    Lack of Transportation (Non-Medical): No  Physical Activity: Sufficiently Active (12/30/2023)   Exercise Vital Sign    Days of Exercise per Week: 7 days    Minutes of Exercise per Session: 40 min  Stress: No Stress Concern Present (12/30/2023)   [employer] of Occupational Health - Occupational Stress Questionnaire    Feeling of Stress: Not at all  Social Connections: Moderately Integrated (12/30/2023)   Social Connection and Isolation Panel    Frequency of Communication with Friends and Family: More than three times a week  Frequency of Social Gatherings with Friends and Family: More than three times a week    Attends Religious Services: Never    Database administrator or Organizations: Yes    Attends Engineer, structural: More than 4 times per year    Marital Status: Married  Catering manager Violence: Not At Risk (12/30/2023)   Humiliation, Afraid, Rape, and Kick questionnaire    Fear of Current or Ex-Partner: No    Emotionally Abused: No    Physically Abused: No    Sexually Abused: No   Review of Systems  Constitutional:        Notes low endurance Wears seat belt  HENT:  Negative for dental problem, hearing loss, tinnitus and trouble swallowing.   Eyes:  Negative for visual disturbance.       No diplopia or unilateral vision loss  Respiratory:  Positive for cough. Negative for chest tightness and shortness of breath.   Cardiovascular:  Negative for chest pain, palpitations and leg swelling.  Gastrointestinal:  Negative for blood in stool and constipation.       Occasional heartburn ---uses pantoprazole  every other day  Endocrine: Negative for polyuria.       Stays thirsty due to diuretic  Genitourinary:  Negative for urgency.       Slow stream--not a problem  Musculoskeletal:  Negative for arthralgias, back pain and joint swelling.  Skin:        Had painful areas on top of both ears a while  back--resolved. Is going to derm (recurred but then improved). Did get better with acyclovir  Allergic/Immunologic: Negative for immunocompromised state.       Spring/fall symptoms---uses flonase or other OTC meds  Neurological:  Negative for dizziness, syncope, light-headedness and headaches.  Hematological:  Negative for adenopathy. Bruises/bleeds easily.  Psychiatric/Behavioral:  Negative for dysphoric mood and sleep disturbance. The patient is not nervous/anxious.        Objective:   Physical Exam Constitutional:      Appearance: Normal appearance.  HENT:     Mouth/Throat:     Pharynx: No oropharyngeal exudate or posterior oropharyngeal erythema.  Eyes:     Conjunctiva/sclera: Conjunctivae normal.     Pupils: Pupils are equal, round, and reactive to light.  Cardiovascular:     Rate and Rhythm: Normal rate and regular rhythm.     Pulses: Normal pulses.     Heart sounds: No murmur heard.    No gallop.  Pulmonary:     Effort: Pulmonary effort is normal.     Breath sounds: Normal breath sounds. No wheezing or rales.  Abdominal:     Palpations: Abdomen is soft.     Tenderness: There is no abdominal tenderness.  Musculoskeletal:     Cervical back: Neck supple.     Right lower leg: No edema.     Left lower leg: No edema.  Lymphadenopathy:     Cervical: No cervical adenopathy.  Skin:    Findings: No lesion or rash.  Neurological:     General: No focal deficit present.     Mental Status: He is alert and oriented to person, place, and time.  Psychiatric:        Mood and Affect: Mood normal.        Behavior: Behavior normal.            Assessment & Plan:

## 2024-01-19 NOTE — Assessment & Plan Note (Signed)
 Controlled on biktarvy 

## 2024-01-19 NOTE — Assessment & Plan Note (Signed)
 Doing well Colon due 2028 Done with prostate cancer screening Discussed adding more resistance exercise Td at pharmacy Thinks he had RSV Flu/COVID vaccines soon

## 2024-01-19 NOTE — Assessment & Plan Note (Signed)
 EF is better Exercise tolerance is good On farxiga  10, entresto 97/103 bid, spironolactone  25 daily Torsemide  adjusted

## 2024-01-20 ENCOUNTER — Encounter: Payer: Medicare Other | Admitting: Internal Medicine

## 2024-02-16 ENCOUNTER — Other Ambulatory Visit: Payer: Self-pay

## 2024-02-16 DIAGNOSIS — Z113 Encounter for screening for infections with a predominantly sexual mode of transmission: Secondary | ICD-10-CM

## 2024-02-16 DIAGNOSIS — L821 Other seborrheic keratosis: Secondary | ICD-10-CM | POA: Diagnosis not present

## 2024-02-16 DIAGNOSIS — D2272 Melanocytic nevi of left lower limb, including hip: Secondary | ICD-10-CM | POA: Diagnosis not present

## 2024-02-16 DIAGNOSIS — D692 Other nonthrombocytopenic purpura: Secondary | ICD-10-CM | POA: Diagnosis not present

## 2024-02-16 DIAGNOSIS — Z79899 Other long term (current) drug therapy: Secondary | ICD-10-CM

## 2024-02-16 DIAGNOSIS — D2271 Melanocytic nevi of right lower limb, including hip: Secondary | ICD-10-CM | POA: Diagnosis not present

## 2024-02-16 DIAGNOSIS — D224 Melanocytic nevi of scalp and neck: Secondary | ICD-10-CM | POA: Diagnosis not present

## 2024-02-16 DIAGNOSIS — D225 Melanocytic nevi of trunk: Secondary | ICD-10-CM | POA: Diagnosis not present

## 2024-02-16 DIAGNOSIS — D2261 Melanocytic nevi of right upper limb, including shoulder: Secondary | ICD-10-CM | POA: Diagnosis not present

## 2024-02-16 DIAGNOSIS — D2262 Melanocytic nevi of left upper limb, including shoulder: Secondary | ICD-10-CM | POA: Diagnosis not present

## 2024-02-16 DIAGNOSIS — L570 Actinic keratosis: Secondary | ICD-10-CM | POA: Diagnosis not present

## 2024-02-16 DIAGNOSIS — B20 Human immunodeficiency virus [HIV] disease: Secondary | ICD-10-CM

## 2024-02-22 ENCOUNTER — Other Ambulatory Visit: Payer: Self-pay

## 2024-02-22 ENCOUNTER — Other Ambulatory Visit: Payer: Medicare Other

## 2024-02-22 ENCOUNTER — Other Ambulatory Visit

## 2024-02-22 ENCOUNTER — Other Ambulatory Visit (HOSPITAL_COMMUNITY)
Admission: RE | Admit: 2024-02-22 | Discharge: 2024-02-22 | Disposition: A | Discharge status: Home / Self Care | Source: Ambulatory Visit | Attending: Infectious Disease | Admitting: Infectious Disease

## 2024-02-22 DIAGNOSIS — B20 Human immunodeficiency virus [HIV] disease: Secondary | ICD-10-CM

## 2024-02-22 DIAGNOSIS — Z113 Encounter for screening for infections with a predominantly sexual mode of transmission: Secondary | ICD-10-CM | POA: Diagnosis present

## 2024-02-22 DIAGNOSIS — Z79899 Other long term (current) drug therapy: Secondary | ICD-10-CM

## 2024-02-22 LAB — T-HELPER CELL (CD4) - (RCID CLINIC ONLY)
CD4 % Helper T Cell: 32 % — ABNORMAL LOW (ref 33–65)
CD4 T Cell Abs: 591 /uL (ref 400–1790)

## 2024-02-23 LAB — URINE CYTOLOGY ANCILLARY ONLY
Chlamydia: NEGATIVE
Comment: NEGATIVE
Comment: NORMAL
Neisseria Gonorrhea: NEGATIVE

## 2024-02-25 LAB — RPR: RPR Ser Ql: NONREACTIVE

## 2024-02-25 LAB — CBC WITH DIFFERENTIAL/PLATELET
Absolute Lymphocytes: 2012 {cells}/uL (ref 850–3900)
Absolute Monocytes: 496 {cells}/uL (ref 200–950)
Basophils Absolute: 51 {cells}/uL (ref 0–200)
Basophils Relative: 0.9 %
Eosinophils Absolute: 91 {cells}/uL (ref 15–500)
Eosinophils Relative: 1.6 %
HCT: 47.4 % (ref 38.5–50.0)
Hemoglobin: 16 g/dL (ref 13.2–17.1)
MCH: 32.6 pg (ref 27.0–33.0)
MCHC: 33.8 g/dL (ref 32.0–36.0)
MCV: 96.5 fL (ref 80.0–100.0)
MPV: 9.7 fL (ref 7.5–12.5)
Monocytes Relative: 8.7 %
Neutro Abs: 3050 {cells}/uL (ref 1500–7800)
Neutrophils Relative %: 53.5 %
Platelets: 167 Thousand/uL (ref 140–400)
RBC: 4.91 Million/uL (ref 4.20–5.80)
RDW: 13.7 % (ref 11.0–15.0)
Total Lymphocyte: 35.3 %
WBC: 5.7 Thousand/uL (ref 3.8–10.8)

## 2024-02-25 LAB — LIPID PANEL
Cholesterol: 122 mg/dL (ref <200)
HDL: 55 mg/dL (ref >=40)
LDL Cholesterol (Calc): 54 mg/dL
Non-HDL Cholesterol (Calc): 67 mg/dL (ref <130)
Total CHOL/HDL Ratio: 2.2 (calc) (ref <5.0)
Triglycerides: 46 mg/dL (ref <150)

## 2024-02-25 LAB — HIV-1 RNA QUANT-NO REFLEX-BLD
HIV 1 RNA Quant: <20 {copies}/mL — AB
HIV-1 RNA Quant, Log: <1.3 {Log_copies}/mL — AB

## 2024-02-25 LAB — COMPLETE METABOLIC PANEL WITHOUT GFR
AG Ratio: 1.8 (calc) (ref 1.0–2.5)
ALT: 29 U/L (ref 9–46)
AST: 16 U/L (ref 10–35)
Albumin: 4.4 g/dL (ref 3.6–5.1)
Alkaline phosphatase (APISO): 92 U/L (ref 35–144)
BUN/Creatinine Ratio: 14 (calc) (ref 6–22)
BUN: 25 mg/dL (ref 7–25)
CO2: 27 mmol/L (ref 20–32)
Calcium: 9.6 mg/dL (ref 8.6–10.3)
Chloride: 101 mmol/L (ref 98–110)
Creat: 1.78 mg/dL — ABNORMAL HIGH (ref 0.70–1.28)
Globulin: 2.4 g/dL (ref 1.9–3.7)
Glucose, Bld: 106 mg/dL — ABNORMAL HIGH (ref 65–99)
Potassium: 4.8 mmol/L (ref 3.5–5.3)
Sodium: 137 mmol/L (ref 135–146)
Total Bilirubin: 0.6 mg/dL (ref 0.2–1.2)
Total Protein: 6.8 g/dL (ref 6.1–8.1)

## 2024-03-06 NOTE — Progress Notes (Unsigned)
 Subjective:  Chief complaint: follow-up for HIV disease on medications   Patient ID: Dale Mcdonald, male    DOB: [birth year], 73 y.o.   MRN: [id number]  HPI  Past Medical History:  Diagnosis Date   Anemia 04/07/2021   Anxiety 03/29/2017   CHF (congestive heart failure) (HCC) 04/14/2023   Cirrhosis (HCC)    COPD (chronic obstructive pulmonary disease) (HCC) 04/14/2023   Heart murmur    Hepatitis    B   HIV (human immunodeficiency virus infection) (HCC)    Hx of adenomatous polyp of colon 02/08/2018   Hyperglycemia 04/17/2020   Hyperlipidemia    Hypertension    NAFLD (nonalcoholic fatty liver disease) 97/85/7976   Cause likely abdominal obesity/metabolic syndrome plus Biktarvy  Hepatic elastography with high likelihood normal i.e. no cirrhosis 2022   Orthostatic dizziness 04/07/2021   Palpitations    Vaccine counseling 04/14/2023    Past Surgical History:  Procedure Laterality Date   CATARACT EXTRACTION W/PHACO Left 11/11/2017   Procedure: CATARACT EXTRACTION PHACO AND INTRAOCULAR LENS PLACEMENT (IOC);  Surgeon: Jaye Fallow, MD;  Location: [facility] ORS;  Service: Ophthalmology;  Laterality: Left;  US  00:59.3 AP% 19.5 CDE 11.54 Fluid pack lot # [id number]   CATARACT EXTRACTION W/PHACO Right 12/21/2017   Procedure: CATARACT EXTRACTION PHACO AND INTRAOCULAR LENS PLACEMENT (IOC);  Surgeon: Jaye Fallow, MD;  Location: [facility] ORS;  Service: Ophthalmology;  Laterality: Right;  US  00:40 AP% 13.5 CDE 5.50 Fluid pack lot $ [id number]   COLONOSCOPY     COLONOSCOPY WITH PROPOFOL  N/A 02/08/2018   Procedure: COLONOSCOPY WITH PROPOFOL ;  Surgeon: Therisa Bi, MD;  Location: [facility] ENDOSCOPY;  Service: Gastroenterology;  Laterality: N/A;   COLONOSCOPY WITH PROPOFOL  N/A 06/03/2021   Procedure: COLONOSCOPY WITH PROPOFOL ;  Surgeon: Unk Corinn Skiff, MD;  Location: [facility] ENDOSCOPY;  Service: Gastroenterology;  Laterality: N/A;   CORONARY ARTERY BYPASS GRAFT  04/07/2018   EYE SURGERY      RETINA   HERNIA REPAIR     MITRAL VALVE REPLACEMENT  03/2018   bovine--with CABG    No family history on file.    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  Tobacco Use   Smoking status: Former    Current packs/day: 2.00    Average packs/day: 2.0 packs/day for 40.0 years (80.0 ttl pk-yrs)    Types: E-cigarettes, Cigarettes    Passive exposure: Past   Smokeless tobacco: Never   Tobacco comments:    Quit 05/25/2017/     States vapes occasionally (04/08/22)  Substance and Sexual Activity   Alcohol use: Yes    Comment: States rare - a beer a month   Drug use: No   Sexual activity: Not on file    Comment: declined condoms  Other Topics Concern   Not on file  Social History Narrative   Retired and widowed   1 son   Former smoker   3 caffeinated beverages a day   No alcohol no drug use no smokeless tobacco      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 [employer]      No living will   Would want son Glendia to be decision  maker   Would accept resuscitation but no prolonged ventilation   Not sure about tube feeds--but definitely not long term   Social Drivers of Corporate investment banker Strain: Medium Risk (12/30/2023)   Overall Financial Resource Strain (CARDIA)    Difficulty of Paying Living Expenses: Somewhat hard  Food Insecurity: No Food Insecurity (12/30/2023)   Hunger Vital Sign    Worried About Running Out of Food in the Last Year: Never true    Ran Out of Food in the Last Year: Never true  Transportation Needs: No Transportation Needs (12/30/2023)   PRAPARE - Administrator, Civil Service (Medical): No    Lack of Transportation  (Non-Medical): No  Physical Activity: Sufficiently Active (12/30/2023)   Exercise Vital Sign    Days of Exercise per Week: 7 days    Minutes of Exercise per Session: 40 min  Stress: No Stress Concern Present (12/30/2023)   [employer] of Occupational Health - Occupational Stress Questionnaire    Feeling of Stress: Not at all  Social Connections: Moderately Integrated (12/30/2023)   Social Connection and Isolation Panel    Frequency of Communication with Friends and Family: More than three times a week    Frequency of Social Gatherings with Friends and Family: More than three times a week    Attends Religious Services: Never    Database administrator or Organizations: Yes    Attends Engineer, structural: More than 4 times per year    Marital Status: Married    Allergies  Allergen Reactions   Magnesium  Nausea Only     Current Outpatient Medications:    albuterol  (PROVENTIL  HFA) 108 (90 Base) MCG/ACT inhaler, Inhale into the lungs every 6 (six) hours as needed for wheezing or shortness of breath., Disp: , Rfl:    atorvastatin  (LIPITOR) 20 MG tablet, Take 1 tablet (20 mg total) by mouth daily., Disp: 100 tablet, Rfl: 3   bictegravir-emtricitabine -tenofovir  AF (BIKTARVY ) 50-200-25 MG TABS tablet, Take 1 tablet by mouth daily., Disp: 30 tablet, Rfl: 11   dapagliflozin propanediol  (FARXIGA ) 10 MG TABS tablet, Take 5 mg by mouth daily., Disp: , Rfl:    ENTRESTO 97-103 MG, Take 1 tablet by mouth 2 (two) times daily., Disp: , Rfl:    fluconazole  (DIFLUCAN ) 150 MG tablet, Take 1 tablet (150 mg total) by mouth once a week., Disp: 15 tablet, Rfl: 3   KLOR-CON  M20 20 MEQ tablet, Take 20 mEq by mouth daily as needed., Disp: , Rfl:    Melatonin 5 MG TABS, Take 5 mg by mouth at bedtime. , Disp: , Rfl:    Multiple Vitamin (MULTI-VITAMIN) tablet, Take by mouth., Disp: , Rfl:    pantoprazole  (PROTONIX ) 40 MG tablet, Take 1 tablet (40 mg total) by mouth daily., Disp: 100 tablet, Rfl: 2    spironolactone  (ALDACTONE ) 25 MG tablet, Take 25 mg by mouth daily., Disp: , Rfl:    torsemide  (DEMADEX ) 20 MG tablet, 10 mg every other day., Disp: , Rfl:    traZODone  (DESYREL ) 50 MG tablet, TAKE 1 TO 2 TABLETS BY MOUTH AT  BEDTIME, Disp: 180 tablet, Rfl: 3   TRELEGY ELLIPTA 100-62.5-25 MCG/ACT AEPB, Inhale 1 puff into the lungs daily., Disp: , Rfl:    valACYclovir  (VALTREX ) 500 MG tablet, Take 1 tablet (500 mg total) by mouth daily as needed., Disp: 90 tablet, Rfl: 2   Review of Systems     Objective:   Physical Exam  Assessment & Plan:

## 2024-03-07 ENCOUNTER — Encounter: Payer: Self-pay | Admitting: Infectious Disease

## 2024-03-07 ENCOUNTER — Ambulatory Visit: Payer: Self-pay | Admitting: Infectious Disease

## 2024-03-07 ENCOUNTER — Other Ambulatory Visit: Payer: Self-pay

## 2024-03-07 VITALS — BP 111/71 | HR 65 | Temp 97.8°F

## 2024-03-07 DIAGNOSIS — M869 Osteomyelitis, unspecified: Secondary | ICD-10-CM

## 2024-03-07 DIAGNOSIS — B181 Chronic viral hepatitis B without delta-agent: Secondary | ICD-10-CM

## 2024-03-07 DIAGNOSIS — K760 Fatty (change of) liver, not elsewhere classified: Secondary | ICD-10-CM | POA: Diagnosis not present

## 2024-03-07 DIAGNOSIS — Z953 Presence of xenogenic heart valve: Secondary | ICD-10-CM

## 2024-03-07 DIAGNOSIS — E785 Hyperlipidemia, unspecified: Secondary | ICD-10-CM

## 2024-03-07 DIAGNOSIS — Z951 Presence of aortocoronary bypass graft: Secondary | ICD-10-CM

## 2024-03-07 DIAGNOSIS — I10 Essential (primary) hypertension: Secondary | ICD-10-CM

## 2024-03-07 DIAGNOSIS — B20 Human immunodeficiency virus [HIV] disease: Secondary | ICD-10-CM

## 2024-03-07 DIAGNOSIS — Z7185 Encounter for immunization safety counseling: Secondary | ICD-10-CM

## 2024-03-07 DIAGNOSIS — N1832 Chronic kidney disease, stage 3b: Secondary | ICD-10-CM

## 2024-03-07 MED ORDER — BIKTARVY 50-200-25 MG PO TABS: 1.0000 | ORAL_TABLET | Freq: Every day | ORAL | 11 refills | Status: AC

## 2024-03-08 ENCOUNTER — Ambulatory Visit
Admission: RE | Admit: 2024-03-08 | Discharge: 2024-03-08 | Disposition: A | Discharge status: Home / Self Care | Source: Ambulatory Visit | Attending: Infectious Disease | Admitting: Infectious Disease

## 2024-03-08 DIAGNOSIS — I5022 Chronic systolic (congestive) heart failure: Secondary | ICD-10-CM | POA: Diagnosis not present

## 2024-03-08 DIAGNOSIS — I5020 Unspecified systolic (congestive) heart failure: Secondary | ICD-10-CM | POA: Diagnosis not present

## 2024-03-08 DIAGNOSIS — Z951 Presence of aortocoronary bypass graft: Secondary | ICD-10-CM | POA: Diagnosis not present

## 2024-03-08 DIAGNOSIS — B181 Chronic viral hepatitis B without delta-agent: Secondary | ICD-10-CM

## 2024-03-08 DIAGNOSIS — K8020 Calculus of gallbladder without cholecystitis without obstruction: Secondary | ICD-10-CM | POA: Diagnosis not present

## 2024-04-14 ENCOUNTER — Ambulatory Visit

## 2024-04-25 ENCOUNTER — Ambulatory Visit
Admission: RE | Admit: 2024-04-25 | Discharge: 2024-04-25 | Disposition: A | Source: Ambulatory Visit | Attending: Acute Care | Admitting: Acute Care

## 2024-04-25 DIAGNOSIS — Z122 Encounter for screening for malignant neoplasm of respiratory organs: Secondary | ICD-10-CM | POA: Diagnosis present

## 2024-04-25 DIAGNOSIS — Z87891 Personal history of nicotine dependence: Secondary | ICD-10-CM | POA: Diagnosis present

## 2024-04-28 ENCOUNTER — Other Ambulatory Visit: Payer: Self-pay

## 2024-04-28 DIAGNOSIS — Z87891 Personal history of nicotine dependence: Secondary | ICD-10-CM

## 2024-04-28 DIAGNOSIS — Z122 Encounter for screening for malignant neoplasm of respiratory organs: Secondary | ICD-10-CM

## 2024-05-12 ENCOUNTER — Ambulatory Visit: Admitting: Gastroenterology

## 2024-05-23 ENCOUNTER — Telehealth: Payer: Self-pay

## 2024-05-23 NOTE — Telephone Encounter (Signed)
 I called and left a voicemail for pt to be scheduled.

## 2024-05-23 NOTE — Telephone Encounter (Signed)
 Pt needs sooner TOC with Dr Bennett prior to Aug 2026 appt. Please help him get scheduled.

## 2024-06-01 ENCOUNTER — Telehealth: Payer: Self-pay

## 2024-06-01 DIAGNOSIS — J439 Emphysema, unspecified: Secondary | ICD-10-CM

## 2024-06-01 NOTE — Telephone Encounter (Signed)
 [employer] and [facility] Medicare are requiring authorization referrals for pts already established with specialists.

## 2024-06-01 NOTE — Telephone Encounter (Signed)
 Copied from CRM #[id number]. Topic: Referral - Request for Referral >> May 31, 2024  4:32 PM Charolett L wrote: Did the patient discuss referral with their provider in the last year? Yes (If No - schedule appointment) (If Yes - send message)  Appointment offered? No  Type of order/referral and detailed reason for visit:   Preference of office, provider, location:  [facility] [address], [place], [place] [address] Phone# [phone] If referral order, have you been seen by this specialty before? No (If Yes, this issue or another issue? When? Where?  Can we respond through MyChart? Yes

## 2024-06-01 NOTE — Telephone Encounter (Signed)
 Copied from CRM [id number]. Topic: Referral - Request for Referral >> May 31, 2024  4:38 PM Charolett L wrote: Did the patient discuss referral with their provider in the last year? Yes (If No - schedule appointment) (If Yes - send message)  Appointment offered? Yes  Type of order/referral and detailed reason for visit:  Office visit/Pulmonologist  Preference of office, provider, location:  [facility] Pulmonologist [facility]  [address] [place], [place], [place] [address] Phone: [phone]  If referral order, have you been seen by this specialty before? No (If Yes, this issue or another issue? When? Where?  Can we respond through MyChart? Yes

## 2024-06-01 NOTE — Telephone Encounter (Signed)
 Copied from CRM [id number]. Topic: Referral - Request for Referral >> May 31, 2024  4:41 PM Charolett L wrote: Did the patient discuss referral with their provider in the last year? Yes (If No - schedule appointment) (If Yes - send message)  Appointment offered? Yes  Type of order/referral and detailed reason for visit:  Gastroenterologist  Preference of office, provider, location: Dale Mcdonald Dale Commander, MD [address], [place], [place] [address] Phone: [phone]  If referral order, have you been seen by this specialty before? Yes (If Yes, this issue or another issue? When? Where?  Can we respond through MyChart? Yes

## 2024-06-01 NOTE — Telephone Encounter (Signed)
 Copied from CRM [id number]. Topic: Referral - Question >> May 31, 2024  4:02 PM Aisha D wrote: Reason for CRM: Rai with [facility] stated that the pt needs a referral for an office visit on 06/06/24 with [facility] pulmonologist, Phone: [phone] Fax: [phone].  Pt also needs a referral for an office visit on 06/07/24 with Dr.Gessner Gastro Phone: [phone] [phone]. Please give the pt a callback with an update once the referral has been submitted.

## 2024-06-02 NOTE — Telephone Encounter (Signed)
 Ordered pulmonary referral to the best of my ability but I do not see reasons specific to place the GI referral   Is there anyway this pt can be moved up prior to march to see Dr. Bennett?  Seems he is a complicated pt

## 2024-06-02 NOTE — Telephone Encounter (Signed)
 Copied from CRM [id number]. Topic: Referral - Question >> Jun 02, 2024  3:24 PM Alfonso HERO wrote: Reason for CRM: patient calling in regards to his referrals and stated the reason he is seeing Dr Avram the GI specialist is because he is also a Liver specialist and recently did a liver study and needs to f/u wit him.

## 2024-06-05 ENCOUNTER — Other Ambulatory Visit: Payer: Self-pay | Admitting: Family

## 2024-06-05 DIAGNOSIS — K760 Fatty (change of) liver, not elsewhere classified: Secondary | ICD-10-CM

## 2024-06-05 NOTE — Telephone Encounter (Signed)
 Placed referral to GI and pulmonary as pt requested.  He is going to be your patient I had also passed along that he would probably be preferable to establish with you sooner than mid year as he is more of a complicated patient.

## 2024-06-06 ENCOUNTER — Telehealth: Payer: Self-pay

## 2024-06-06 DIAGNOSIS — B009 Herpesviral infection, unspecified: Secondary | ICD-10-CM

## 2024-06-06 MED ORDER — VALACYCLOVIR HCL 500 MG PO TABS: 500.0000 mg | ORAL_TABLET | Freq: Every day | ORAL | 0 refills | Status: AC | PRN

## 2024-06-06 NOTE — Telephone Encounter (Signed)
 SABRA

## 2024-06-07 ENCOUNTER — Encounter: Payer: Self-pay | Admitting: Internal Medicine

## 2024-06-07 ENCOUNTER — Ambulatory Visit: Admitting: Internal Medicine

## 2024-06-07 ENCOUNTER — Other Ambulatory Visit (INDEPENDENT_AMBULATORY_CARE_PROVIDER_SITE_OTHER)

## 2024-06-07 VITALS — BP 120/72 | HR 70 | Ht 70.0 in | Wt 188.0 lb

## 2024-06-07 DIAGNOSIS — K760 Fatty (change of) liver, not elsewhere classified: Secondary | ICD-10-CM | POA: Diagnosis not present

## 2024-06-07 DIAGNOSIS — B181 Chronic viral hepatitis B without delta-agent: Secondary | ICD-10-CM | POA: Diagnosis not present

## 2024-06-07 LAB — CBC WITH DIFFERENTIAL/PLATELET
Basophils Absolute: 0 K/uL (ref 0.0–0.1)
Basophils Relative: 0.6 % (ref 0.0–3.0)
Eosinophils Absolute: 0.1 K/uL (ref 0.0–0.7)
Eosinophils Relative: 1.8 % (ref 0.0–5.0)
HCT: 44.4 % (ref 39.0–52.0)
Hemoglobin: 15.1 g/dL (ref 13.0–17.0)
Lymphocytes Relative: 31.8 % (ref 12.0–46.0)
Lymphs Abs: 1.6 K/uL (ref 0.7–4.0)
MCHC: 34 g/dL (ref 30.0–36.0)
MCV: 97.6 fl (ref 78.0–100.0)
Monocytes Absolute: 0.5 K/uL (ref 0.1–1.0)
Monocytes Relative: 10.5 % (ref 3.0–12.0)
Neutro Abs: 2.8 K/uL (ref 1.4–7.7)
Neutrophils Relative %: 55.3 % (ref 43.0–77.0)
Platelets: 134 K/uL — ABNORMAL LOW (ref 150.0–400.0)
RBC: 4.55 Mil/uL (ref 4.22–5.81)
RDW: 13.4 % (ref 11.5–15.5)
WBC: 5.1 K/uL (ref 4.0–10.5)

## 2024-06-07 LAB — COMPREHENSIVE METABOLIC PANEL WITH GFR
ALT: 24 U/L (ref 3–53)
AST: 19 U/L (ref 5–37)
Albumin: 4.6 g/dL (ref 3.5–5.2)
Alkaline Phosphatase: 94 U/L (ref 39–117)
BUN: 23 mg/dL (ref 6–23)
CO2: 29 meq/L (ref 19–32)
Calcium: 9.4 mg/dL (ref 8.4–10.5)
Chloride: 99 meq/L (ref 96–112)
Creatinine, Ser: 1.97 mg/dL — ABNORMAL HIGH (ref 0.40–1.50)
GFR: 33.24 mL/min — ABNORMAL LOW
Glucose, Bld: 97 mg/dL (ref 70–99)
Potassium: 4.5 meq/L (ref 3.5–5.1)
Sodium: 133 meq/L — ABNORMAL LOW (ref 135–145)
Total Bilirubin: 0.8 mg/dL (ref 0.2–1.2)
Total Protein: 7.2 g/dL (ref 6.0–8.3)

## 2024-06-07 LAB — PROTIME-INR
INR: 1 ratio (ref 0.8–1.0)
Prothrombin Time: 10.9 s (ref 9.6–13.1)

## 2024-06-07 NOTE — Patient Instructions (Addendum)
 Dietary Instructions for Metabolic Dysfunction-Associated Fatty Liver Disease (MAFLD)  Your Diet Plan for Fatty Liver Disease   Your doctor has diagnosed you with metabolic dysfunction-associated fatty liver disease (MAFLD), also called MASLD. The good news is that changes to your diet can significantly improve your liver health. This handout explains what you should eat and what to avoid.  Weight Loss Goals   Losing weight is one of the most important steps you can take to improve your liver health:  - 5-7% weight loss can reduce fat in your liver and improve inflammation  - 10% or more weight loss can help reverse liver scarring (fibrosis)  - Even modest weight loss of 3-5% can be beneficial if you are at a normal weight  - Weigh yourself every day or at least most days of the week to track  Recommended Eating Pattern: Mediterranean-Style Diet   The Mediterranean diet is the most studied and recommended eating pattern for fatty liver disease. This diet includes:  Foods to Eat Daily:  - Fresh vegetables and fruits  - Whole grains (brown rice, whole wheat bread, oatmeal)  - Legumes (beans, lentils, chickpeas)  - Fish (especially fatty fish like salmon, sardines, mackerel)  - Olive oil, nuts, and seeds as your main fat sources  - Herbs and spices for flavoring  Foods to Eat in Moderation:  - Low-fat dairy products  - Poultry  - Eggs  Foods to Limit or Avoid:  - Red meat and processed meats (bacon, sausage, deli meats)  - Ultraprocessed foods and fast food  - Foods high in saturated fat  - Refined carbohydrates (white bread, white rice, pastries)  Specific Foods and Beverages to Avoid   Sugar and Sweetened Beverages:  - Avoid sugar-sweetened beverages (soda, sweet tea, energy drinks, fruit juice)  - Limit high-fructose corn syrup to less than 20 grams per day  - Reduce intake of foods with added sugars and refined sugars  - Note: Fructose from whole  fruits is fine and should not be restricted  Alcohol:  - Avoid alcohol completely if you have advanced liver disease  - Limit alcohol intake as it increases risk of cirrhosis and liver cancer   Beneficial Additions   Coffee:  - Drinking 3 or more cups of coffee per day (regular or decaf) may reduce liver fat and scarring  - This is recommended unless you have other health reasons to avoid coffee  Healthy Fats:  - Use olive oil as your primary cooking oil  - Include nuts and seeds daily  - Eat fatty fish 2-3 times per week for omega-3 fatty acids  Your provider has requested that you go to the basement level for lab work before leaving today. Press B on the elevator. The lab is located at the first door on the left as you exit the elevator.  Due to recent changes in healthcare laws, you may see the results of your imaging and laboratory studies on MyChart before your provider has had a chance to review them.  We understand that in some cases there may be results that are confusing or concerning to you. Not all laboratory results come back in the same time frame and the provider may be waiting for multiple results in order to interpret others.  Please give us  48 hours in order for your provider to thoroughly review all the results before contacting the office for clarification of your results.   I appreciate the opportunity to care for you. Lupita Commander,  MD, NOLIA

## 2024-06-07 NOTE — Progress Notes (Signed)
 [address]       Dale Mcdonald 74 y.o. [birth year] [id number]  Assessment & Plan:   Encounter Diagnoses  Name Primary?   Metabolic dysfunction-associated fatty liver disease (MAFLD) Yes   Chronic hepatitis B virus infection (HCC)      Asymptomatic MASLD with low risk for advanced fibrosis or cirrhosis suspected based upon fib 4 calculated on existing labs was  Fibrosis 4 Score = 1.28;  Dale Mcdonald Abstains from alcohol. - Reviewed ultrasound and elastography findings. - Discussed low cirrhosis risk based on labs. - Deferred elastography due to low risk and preference. - Offered future in-office elastography when available. - Reinforced alcohol abstinence. - Weight loss and low carbohydrate Mediterranean diet recommended and instructions provided in after visit summary  Chronic hepatitis B without acute complications; current antiretroviral regimen may affect hepatitis B. Last assessed November 2024. - Reviewed hepatitis B history and treatment. - Discussed last status assessment timing. - Ordered labs to reassess hepatitis B. - Will communicate results and update infectious disease provider.  Orders Placed This Encounter  Procedures   CBC with Differential/Platelet   Comprehensive metabolic panel with GFR   Protime-INR   Hepatitis B surface antigen   Hepatitis B DNA, ultraquantitative, PCR   Lab Results  Component Value Date   WBC 5.1 06/07/2024   HGB 15.1 06/07/2024   HCT 44.4 06/07/2024   MCV 97.6 06/07/2024   PLT 134.0 (L) 06/07/2024     Chemistry      Component Value Date/Time   NA 133 (L) 06/07/2024 1448   K 4.5 06/07/2024 1448   CL 99 06/07/2024 1448   CO2 29 06/07/2024 1448   BUN 23 06/07/2024 1448   CREATININE 1.97 (H) 06/07/2024 1448   CREATININE 1.78 (H) 02/22/2024 0907      Component Value Date/Time   CALCIUM  9.4 06/07/2024 1448   ALKPHOS 94 06/07/2024 1448   AST 19 06/07/2024 1448   ALT 24 06/07/2024 1448   BILITOT 0.8 06/07/2024 1448      Lab Results   Component Value Date   INR 1.0 06/07/2024   INR 0.9 08/05/2020   Fibrosis 4 Score = 2.08  Fib-4 interpretation is not validated for people under 35 or over 33 years of age. However, scores under 2.0 are generally considered low risk.  His Fib 4 score is higher so I do think elastography would be worthwhile.  He could have worsening liver histology.  He could be a candidate for GLP-1 therapy with Wegovy.  Will discuss with him.  Await hepatitis B testing to return also.    Subjective:   Chief Complaint: ? cirrhosis  HPI Discussed the use of AI scribe software for clinical note transcription with the patient, who gave verbal consent to proceed.   Dale Mcdonald is a 74 year old male with nonalcoholic fatty liver disease, chronic hepatitis B, HIV and history of colon polyps who presents for follow-up of recent liver imaging and ongoing management of liver disease.  Recent ultrasound and chest CT raise the possibility of cirrhosis.  Irregular liver margin on screening CT of the chest 04/25/2024, ultrasound right upper quadrant 03/08/2024 with cirrhotic liver morphology reported because of heterogeneous and coarsened parenchymal echogenicity and nodular contour.  Portal vein was patent.   Liver Disease Evaluation: - Nonalcoholic fatty liver disease and chronic hepatitis B infection - Recent liver ultrasound raised concern for cirrhosis; prior ultrasound with elastography performed in 2022 and this was normal.  Median K PA was 1.9. - Seeks  reassurance regarding liver status - Alcohol intake is minimal, limited to approximately two beers per year - No abdominal pain, jaundice, nausea, vomiting, hematemesis, melena, or changes in bowel habits  Chronic Hepatitis B Management: - Persistent chronic hepatitis B infection - Currently taking Biktarvy , initiated for HIV and possibly hepatitis B - Uncertain timing of last hepatitis B status check, possibly November 2024 - Scheduled follow-up with  infectious disease specialist in April - No fever, chills, or night sweats  Colorectal Cancer Surveillance: - History of colon polyps - Last colonoscopy in 2023; next scheduled for 2028 - No gastrointestinal symptoms including abdominal pain, rectal bleeding, constipation, or diarrhea  Pulmonary Disease and Medication Effects: - COPD previously managed with Trelegy, which caused oral thrush - Diflucan  150 mg once weekly prescribed for two years, recently discontinued due to concerns about potential liver effects  General Health Status: - No other significant health issues aside from old age - Feels well overall     LFTs were normal in October.  Platelets were also normal.  Though labs taken at another institution showed platelets 148 with normal being 150.   Allergies[1] Active Medications[2] Past Medical History:  Diagnosis Date   Anemia 04/07/2021   Anxiety 03/29/2017   CHF (congestive heart failure) (HCC) 04/14/2023   Cirrhosis (HCC)    COPD (chronic obstructive pulmonary disease) (HCC) 04/14/2023   Heart murmur    Hepatitis    B   HIV (human immunodeficiency virus infection) (HCC)    Hx of adenomatous polyp of colon 02/08/2018   Hyperglycemia 04/17/2020   Hyperlipidemia    Hypertension    NAFLD (nonalcoholic fatty liver disease) 97/85/7976   Cause likely abdominal obesity/metabolic syndrome plus Biktarvy  Hepatic elastography with high likelihood normal i.e. no cirrhosis 2022   Orthostatic dizziness 04/07/2021   Palpitations    Vaccine counseling 04/14/2023   Past Surgical History:  Procedure Laterality Date   CATARACT EXTRACTION W/PHACO Left 11/11/2017   Procedure: CATARACT EXTRACTION PHACO AND INTRAOCULAR LENS PLACEMENT (IOC);  Surgeon: Jaye Fallow, MD;  Location: [facility] ORS;  Service: Ophthalmology;  Laterality: Left;  US  00:59.3 AP% 19.5 CDE 11.54 Fluid pack lot # [id number]   CATARACT EXTRACTION W/PHACO Right 12/21/2017   Procedure: CATARACT EXTRACTION  PHACO AND INTRAOCULAR LENS PLACEMENT (IOC);  Surgeon: Jaye Fallow, MD;  Location: [facility] ORS;  Service: Ophthalmology;  Laterality: Right;  US  00:40 AP% 13.5 CDE 5.50 Fluid pack lot $ [id number]   COLONOSCOPY     COLONOSCOPY WITH PROPOFOL  N/A 02/08/2018   Procedure: COLONOSCOPY WITH PROPOFOL ;  Surgeon: Therisa Bi, MD;  Location: [facility] ENDOSCOPY;  Service: Gastroenterology;  Laterality: N/A;   COLONOSCOPY WITH PROPOFOL  N/A 06/03/2021   Procedure: COLONOSCOPY WITH PROPOFOL ;  Surgeon: Unk Corinn Skiff, MD;  Location: [facility] END[facility]COPY;  Service: Gastroenterology;  Laterality: N/A;   CORONARY ARTERY BYPASS GRAFT  04/07/2018   EYE SURGERY     RETINA   HERNIA REPAIR     MITRAL VALVE REPLACEMENT  03/2018   bovine--with CABG   Social History   Social History Narrative   Retired and widowed   1 son   Former smoker   3 caffeinated beverages a day   No alcohol no drug use no smokeless tobacco      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   Review of Systems As per HPI  Objective:   Physical Exam @BP  120/72   Pulse 70   Ht 5' 10 (1.778 m)   Wt 188 lb (85.3 kg)   SpO2 95%   BMI 26.98 kg/m @  General:  NAD Eyes:   anicteric Lungs:  clear Heart::  S1S2 no rubs, murmurs or gallops Abdomen:  soft and nontender, BS+, there is obesity, no hepatosplenomegaly or mass Ext:   no edema, cyanosis or clubbing  No stigmata of chronic liver disease  Data Reviewed:  See HPI and assessment and plan    [1]  Allergies Allergen Reactions   Magnesium  Nausea Only  [2]  Current Meds  Medication Sig   albuterol  (PROVENTIL  HFA) 108 (90 Base) MCG/ACT inhaler Inhale into the lungs every 6 (six) hours as  needed for wheezing or shortness of breath.   atorvastatin  (LIPITOR) 20 MG tablet Take 1 tablet (20 mg total) by mouth daily.   bictegravir-emtricitabine -tenofovir  AF (BIKTARVY ) 50-200-25 MG TABS tablet Take 1 tablet by mouth daily.   dapagliflozin propanediol (FARXIGA) 10 MG TABS tablet Take 5 mg by mouth daily.   ENTRESTO 97-103 MG Take 1 tablet by mouth 2 (two) times daily.   Ipratropium-Albuterol  (COMBIVENT RESPIMAT) 20-100 MCG/ACT AERS respimat INHALE 2 INHALATIONS BY MOUTH  INTO THE LUNGS 3 TIMES DAILY AS  NEEDED FOR WHEEZING OR SHORTNESS OF BREATH   KLOR-CON  M20 20 MEQ tablet Take 20 mEq by mouth daily as needed.   Melatonin 5 MG TABS Take 5 mg by mouth at bedtime.    Multiple Vitamin (MULTI-VITAMIN) tablet Take by mouth.   pantoprazole  (PROTONIX ) 40 MG tablet Take 1 tablet (40 mg total) by mouth daily.   spironolactone (ALDACTONE) 25 MG tablet Take 25 mg by mouth daily.   torsemide  (DEMADEX ) 20 MG tablet 10 mg every other day.   traZODone  (DESYREL ) 50 MG tablet TAKE 1 TO 2 TABLETS BY MOUTH AT  BEDTIME   valACYclovir  (VALTREX ) 500 MG tablet Take 1 tablet (500 mg total) by mouth daily as needed.   "

## 2024-06-08 LAB — HEPATITIS B SURFACE ANTIGEN: Hepatitis B Surface Ag: REACTIVE — AB

## 2024-06-10 LAB — HEPATITIS B DNA, ULTRAQUANTITATIVE, PCR
Hepatitis B DNA: NOT DETECTED [IU]/mL
Hepatitis B virus DNA: NOT DETECTED {Log_IU}/mL

## 2024-06-12 ENCOUNTER — Other Ambulatory Visit: Payer: Self-pay | Admitting: Internal Medicine

## 2024-06-12 ENCOUNTER — Ambulatory Visit: Payer: Self-pay | Admitting: Internal Medicine

## 2024-06-12 DIAGNOSIS — K760 Fatty (change of) liver, not elsewhere classified: Secondary | ICD-10-CM

## 2024-06-26 ENCOUNTER — Telehealth: Payer: Self-pay

## 2024-06-26 DIAGNOSIS — Z0120 Encounter for dental examination and cleaning without abnormal findings: Secondary | ICD-10-CM

## 2024-06-26 NOTE — Telephone Encounter (Signed)
 Copied from CRM [id number]. Topic: Referral - Question >> Jun 26, 2024 10:36 AM Harlene ORN wrote: Reason for CRM: Patient called to follow up on a referral that he needs to see his Dentist that is scheduled for 02/11. Please call back to discuss with the patient.

## 2024-06-26 NOTE — Telephone Encounter (Signed)
"  Referral signed  "

## 2024-07-27 ENCOUNTER — Encounter

## 2024-09-05 ENCOUNTER — Ambulatory Visit: Admitting: Infectious Disease

## 2024-09-25 ENCOUNTER — Ambulatory Visit: Admitting: Infectious Disease

## 2025-01-01 ENCOUNTER — Ambulatory Visit

## 2025-01-04 ENCOUNTER — Ambulatory Visit

## 2025-01-19 ENCOUNTER — Encounter
